# Patient Record
Sex: Female | Born: 1950 | Race: White | Hispanic: No | Smoking: Never smoker
Health system: Southern US, Community
[De-identification: ages and names within clinical notes are randomized; demographics above are authoritative.]

## PROBLEM LIST (undated history)

## (undated) DIAGNOSIS — K5792 Diverticulitis of intestine, part unspecified, without perforation or abscess without bleeding: Secondary | ICD-10-CM

## (undated) DIAGNOSIS — E079 Disorder of thyroid, unspecified: Secondary | ICD-10-CM

## (undated) DIAGNOSIS — A048 Other specified bacterial intestinal infections: Secondary | ICD-10-CM

## (undated) DIAGNOSIS — M1990 Unspecified osteoarthritis, unspecified site: Secondary | ICD-10-CM

## (undated) DIAGNOSIS — M810 Age-related osteoporosis without current pathological fracture: Secondary | ICD-10-CM

## (undated) DIAGNOSIS — T7840XA Allergy, unspecified, initial encounter: Secondary | ICD-10-CM

## (undated) DIAGNOSIS — L719 Rosacea, unspecified: Secondary | ICD-10-CM

## (undated) DIAGNOSIS — J40 Bronchitis, not specified as acute or chronic: Secondary | ICD-10-CM

## (undated) DIAGNOSIS — D509 Iron deficiency anemia, unspecified: Secondary | ICD-10-CM

## (undated) DIAGNOSIS — M5090 Cervical disc disorder, unspecified, unspecified cervical region: Secondary | ICD-10-CM

## (undated) HISTORY — PX: TUBAL LIGATION: SHX77

## (undated) HISTORY — DX: Other specified bacterial intestinal infections: A04.8

## (undated) HISTORY — DX: Allergy, unspecified, initial encounter: T78.40XA

## (undated) HISTORY — DX: Cervical disc disorder, unspecified, unspecified cervical region: M50.90

## (undated) HISTORY — DX: Age-related osteoporosis without current pathological fracture: M81.0

## (undated) HISTORY — PX: COLONOSCOPY: SHX174

## (undated) HISTORY — DX: Unspecified osteoarthritis, unspecified site: M19.90

## (undated) HISTORY — DX: Disorder of thyroid, unspecified: E07.9

## (undated) HISTORY — DX: Rosacea, unspecified: L71.9

## (undated) HISTORY — PX: CHOLECYSTECTOMY: SHX55

## (undated) HISTORY — DX: Diverticulitis of intestine, part unspecified, without perforation or abscess without bleeding: K57.92

## (undated) HISTORY — PX: AUGMENTATION MAMMAPLASTY: SUR837

## (undated) HISTORY — DX: Bronchitis, not specified as acute or chronic: J40

## (undated) HISTORY — PX: COSMETIC SURGERY: SHX468

---
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
## 1978-02-25 HISTORY — PX: OTHER SURGICAL HISTORY: SHX169

## 1984-02-26 HISTORY — PX: GALLBLADDER SURGERY: SHX652

## 1997-07-14 ENCOUNTER — Other Ambulatory Visit: Admission: RE | Admit: 1997-07-14 | Discharge: 1997-07-14 | Payer: Self-pay | Admitting: Unknown Physician Specialty

## 1998-07-21 ENCOUNTER — Encounter: Payer: Self-pay | Admitting: Obstetrics and Gynecology

## 1998-07-21 ENCOUNTER — Ambulatory Visit (HOSPITAL_COMMUNITY): Admission: RE | Admit: 1998-07-21 | Discharge: 1998-07-21 | Payer: Self-pay | Admitting: Obstetrics and Gynecology

## 1998-08-02 ENCOUNTER — Ambulatory Visit (HOSPITAL_COMMUNITY): Admission: RE | Admit: 1998-08-02 | Discharge: 1998-08-02 | Payer: Self-pay | Admitting: Obstetrics and Gynecology

## 1998-08-02 ENCOUNTER — Encounter: Payer: Self-pay | Admitting: Obstetrics and Gynecology

## 1998-09-13 ENCOUNTER — Ambulatory Visit (HOSPITAL_COMMUNITY): Admission: RE | Admit: 1998-09-13 | Discharge: 1998-09-13 | Payer: Self-pay | Admitting: Obstetrics and Gynecology

## 1998-09-13 ENCOUNTER — Encounter: Payer: Self-pay | Admitting: Obstetrics and Gynecology

## 1999-08-20 ENCOUNTER — Other Ambulatory Visit: Admission: RE | Admit: 1999-08-20 | Discharge: 1999-08-20 | Payer: Self-pay | Admitting: Obstetrics and Gynecology

## 1999-09-04 ENCOUNTER — Encounter: Admission: RE | Admit: 1999-09-04 | Discharge: 1999-09-04 | Payer: Self-pay | Admitting: Obstetrics and Gynecology

## 1999-09-04 ENCOUNTER — Encounter: Payer: Self-pay | Admitting: Obstetrics and Gynecology

## 2000-03-19 ENCOUNTER — Encounter: Payer: Self-pay | Admitting: Obstetrics and Gynecology

## 2000-03-19 ENCOUNTER — Ambulatory Visit (HOSPITAL_COMMUNITY): Admission: RE | Admit: 2000-03-19 | Discharge: 2000-03-19 | Payer: Self-pay | Admitting: Obstetrics and Gynecology

## 2000-08-20 ENCOUNTER — Other Ambulatory Visit: Admission: RE | Admit: 2000-08-20 | Discharge: 2000-08-20 | Payer: Self-pay | Admitting: Obstetrics and Gynecology

## 2000-09-05 ENCOUNTER — Encounter: Admission: RE | Admit: 2000-09-05 | Discharge: 2000-09-05 | Payer: Self-pay | Admitting: Obstetrics and Gynecology

## 2000-09-05 ENCOUNTER — Encounter: Payer: Self-pay | Admitting: Obstetrics and Gynecology

## 2001-02-25 HISTORY — PX: BREAST ENHANCEMENT SURGERY: SHX7

## 2001-08-21 ENCOUNTER — Other Ambulatory Visit: Admission: RE | Admit: 2001-08-21 | Discharge: 2001-08-21 | Payer: Self-pay | Admitting: Obstetrics and Gynecology

## 2001-09-08 ENCOUNTER — Encounter: Admission: RE | Admit: 2001-09-08 | Discharge: 2001-09-08 | Payer: Self-pay | Admitting: Obstetrics and Gynecology

## 2001-09-08 ENCOUNTER — Encounter: Payer: Self-pay | Admitting: Obstetrics and Gynecology

## 2002-08-23 ENCOUNTER — Other Ambulatory Visit: Admission: RE | Admit: 2002-08-23 | Discharge: 2002-08-23 | Payer: Self-pay | Admitting: Obstetrics and Gynecology

## 2002-09-13 ENCOUNTER — Encounter: Payer: Self-pay | Admitting: Obstetrics and Gynecology

## 2002-09-13 ENCOUNTER — Encounter: Admission: RE | Admit: 2002-09-13 | Discharge: 2002-09-13 | Payer: Self-pay | Admitting: Obstetrics and Gynecology

## 2003-06-03 ENCOUNTER — Ambulatory Visit (HOSPITAL_COMMUNITY): Admission: RE | Admit: 2003-06-03 | Discharge: 2003-06-03 | Payer: Self-pay | Admitting: Orthopedic Surgery

## 2003-09-23 ENCOUNTER — Encounter: Admission: RE | Admit: 2003-09-23 | Discharge: 2003-09-23 | Payer: Self-pay | Admitting: Obstetrics and Gynecology

## 2003-10-05 ENCOUNTER — Other Ambulatory Visit: Admission: RE | Admit: 2003-10-05 | Discharge: 2003-10-05 | Payer: Self-pay | Admitting: Obstetrics and Gynecology

## 2003-10-13 ENCOUNTER — Encounter: Admission: RE | Admit: 2003-10-13 | Discharge: 2003-10-13 | Payer: Self-pay | Admitting: Obstetrics and Gynecology

## 2004-06-13 ENCOUNTER — Emergency Department (HOSPITAL_COMMUNITY): Admission: EM | Admit: 2004-06-13 | Discharge: 2004-06-13 | Payer: Self-pay | Admitting: Family Medicine

## 2004-07-04 ENCOUNTER — Encounter: Admission: RE | Admit: 2004-07-04 | Discharge: 2004-07-04 | Payer: Self-pay | Admitting: Obstetrics and Gynecology

## 2004-10-24 ENCOUNTER — Encounter: Admission: RE | Admit: 2004-10-24 | Discharge: 2004-10-24 | Payer: Self-pay | Admitting: Obstetrics and Gynecology

## 2004-11-22 ENCOUNTER — Other Ambulatory Visit: Admission: RE | Admit: 2004-11-22 | Discharge: 2004-11-22 | Payer: Self-pay | Admitting: Obstetrics and Gynecology

## 2005-12-02 ENCOUNTER — Other Ambulatory Visit: Admission: RE | Admit: 2005-12-02 | Discharge: 2005-12-02 | Payer: Self-pay | Admitting: Obstetrics and Gynecology

## 2005-12-31 ENCOUNTER — Encounter: Admission: RE | Admit: 2005-12-31 | Discharge: 2005-12-31 | Payer: Self-pay | Admitting: Obstetrics and Gynecology

## 2006-02-27 ENCOUNTER — Ambulatory Visit: Payer: Self-pay | Admitting: Gastroenterology

## 2006-03-07 ENCOUNTER — Ambulatory Visit: Payer: Self-pay | Admitting: Gastroenterology

## 2006-03-09 ENCOUNTER — Emergency Department (HOSPITAL_COMMUNITY): Admission: EM | Admit: 2006-03-09 | Discharge: 2006-03-09 | Payer: Self-pay | Admitting: Emergency Medicine

## 2006-03-10 ENCOUNTER — Ambulatory Visit: Payer: Self-pay | Admitting: Cardiology

## 2006-03-10 ENCOUNTER — Ambulatory Visit: Payer: Self-pay | Admitting: Gastroenterology

## 2006-03-10 LAB — CONVERTED CEMR LAB
Bilirubin Urine: NEGATIVE
Crystals: NEGATIVE
Ketones, ur: NEGATIVE mg/dL
Leukocytes, UA: NEGATIVE
Mucus, UA: NEGATIVE
Nitrite: NEGATIVE
Urine Glucose: NEGATIVE mg/dL
Urobilinogen, UA: 0.2 (ref 0.0–1.0)

## 2006-08-26 LAB — HM COLONOSCOPY: HM Colonoscopy: NORMAL

## 2007-01-07 ENCOUNTER — Encounter: Admission: RE | Admit: 2007-01-07 | Discharge: 2007-01-07 | Payer: Self-pay | Admitting: Obstetrics

## 2007-04-08 ENCOUNTER — Emergency Department (HOSPITAL_COMMUNITY): Admission: EM | Admit: 2007-04-08 | Discharge: 2007-04-08 | Payer: Self-pay | Admitting: Emergency Medicine

## 2008-01-01 ENCOUNTER — Encounter: Admission: RE | Admit: 2008-01-01 | Discharge: 2008-01-01 | Payer: Self-pay | Admitting: Obstetrics

## 2008-04-27 ENCOUNTER — Emergency Department (HOSPITAL_COMMUNITY): Admission: EM | Admit: 2008-04-27 | Discharge: 2008-04-27 | Payer: Self-pay | Admitting: Emergency Medicine

## 2008-05-31 ENCOUNTER — Encounter: Admission: RE | Admit: 2008-05-31 | Discharge: 2008-05-31 | Payer: Self-pay | Admitting: Neurosurgery

## 2009-01-04 ENCOUNTER — Encounter: Admission: RE | Admit: 2009-01-04 | Discharge: 2009-01-04 | Payer: Self-pay | Admitting: Obstetrics

## 2009-08-25 LAB — HM MAMMOGRAPHY: HM Mammogram: NORMAL

## 2009-09-29 ENCOUNTER — Telehealth (INDEPENDENT_AMBULATORY_CARE_PROVIDER_SITE_OTHER): Payer: Self-pay | Admitting: *Deleted

## 2010-01-05 ENCOUNTER — Encounter: Admission: RE | Admit: 2010-01-05 | Discharge: 2010-01-05 | Payer: Self-pay | Admitting: Obstetrics

## 2010-01-28 ENCOUNTER — Emergency Department (HOSPITAL_COMMUNITY)
Admission: EM | Admit: 2010-01-28 | Discharge: 2010-01-28 | Payer: Self-pay | Source: Home / Self Care | Admitting: Emergency Medicine

## 2010-03-27 NOTE — Progress Notes (Signed)
  Phone Note Other Incoming   Request: Send information Action Taken: Software engineer of Call: Request for records received from [employer]. Request forwarded to [employer].

## 2010-04-26 ENCOUNTER — Other Ambulatory Visit: Payer: Self-pay | Admitting: Neurosurgery

## 2010-04-26 DIAGNOSIS — M47812 Spondylosis without myelopathy or radiculopathy, cervical region: Secondary | ICD-10-CM

## 2010-04-30 ENCOUNTER — Other Ambulatory Visit: Payer: Self-pay

## 2010-05-08 LAB — URINALYSIS, ROUTINE W REFLEX MICROSCOPIC
Bilirubin Urine: NEGATIVE
Glucose, UA: NEGATIVE mg/dL
Ketones, ur: 15 mg/dL — AB
pH: 8.5 — ABNORMAL HIGH (ref 5.0–8.0)

## 2010-05-08 LAB — BASIC METABOLIC PANEL
BUN: 11 mg/dL (ref 6–23)
CO2: 24 mEq/L (ref 19–32)
Glucose, Bld: 91 mg/dL (ref 70–99)
Potassium: 3.6 mEq/L (ref 3.5–5.1)
Sodium: 138 mEq/L (ref 135–145)

## 2010-05-08 LAB — CBC
HCT: 34.1 % — ABNORMAL LOW (ref 36.0–46.0)
Hemoglobin: 10.7 g/dL — ABNORMAL LOW (ref 12.0–15.0)
MCH: 26.8 pg (ref 26.0–34.0)
MCHC: 31.4 g/dL (ref 30.0–36.0)

## 2010-05-08 LAB — URINE MICROSCOPIC-ADD ON

## 2010-05-08 LAB — DIFFERENTIAL
Lymphocytes Relative: 13 % (ref 12–46)
Lymphs Abs: 0.7 10*3/uL (ref 0.7–4.0)
Monocytes Absolute: 0.4 10*3/uL (ref 0.1–1.0)
Monocytes Relative: 7 % (ref 3–12)
Neutro Abs: 4.3 10*3/uL (ref 1.7–7.7)

## 2010-05-11 ENCOUNTER — Ambulatory Visit
Admission: RE | Admit: 2010-05-11 | Discharge: 2010-05-11 | Disposition: A | Discharge status: Home / Self Care | Payer: 59 | Source: Ambulatory Visit | Attending: Neurosurgery | Admitting: Neurosurgery

## 2010-05-11 DIAGNOSIS — M47812 Spondylosis without myelopathy or radiculopathy, cervical region: Secondary | ICD-10-CM

## 2010-07-13 NOTE — Assessment & Plan Note (Signed)
[facility]                                 ON-CALL NOTE   REGHAN, THUL                        MRN:          [id number]  DATE:03/08/2006                            DOB:          Jan 13, 1951    Ms. Brightbill called to say that approximately 4 hours following her  colonoscopy yesterday she developed low back pain.  Pain does radiate  around to the front and is sharp but not particularly intense.  She has  not had a bowel movement since her procedure.  Pain is only of mild  intensity.  It is persistent but not severe.  She has no nausea,  vomiting or fever.   I explained that this may be simple gas pain and perhaps some  musculoskeletal pain.  She is instructed me should the pain get worse or  if it does not slowly subside over the next 4-6 hours.     Barbette Hair. Arlyce Dice, MD,FACG  Electronically Signed    RDK/MedQ  DD: 03/08/2006  DT: 03/09/2006  Job #: [id number]   cc:   Ulyess Mort, MD

## 2010-07-13 NOTE — Assessment & Plan Note (Signed)
[facility]                                 ON-CALL NOTE   Bethany Carter, Bethany Carter                        MRN:          [id number]  DATE:03/09/2006                            DOB:          02-May-1950    Dr. Lynelle Doctor called me from the emergency room tonight.  Bethany Carter had a  colonoscopy on Friday.  It is not clear whether polyps were removed or  how the procedure went for her, but she, since then, has had some  cramping discomfort in her lower abdomen.  She spoke with Dr. Arlyce Dice  earlier either today or yesterday about her discomfort.  It seemed mild  and she was instructed to see how she did over the next several hours.  She continued to have the discomfort, so she eventually presented to the  emergency room, I believe at [facility].  Dr. Lynelle Doctor had already  evaluated her by the time he paged me.  He said she is not tender on  exam.  Her CBC and her electrolytes were completely normal.  An acute  abdominal series was done and it showed no free air.   It sounds like she may have some mild spasm discomfort following the air  insufflation from the colonoscopy.  I talked with Dr. Lynelle Doctor about giving  her a prescription for Levbid a 10 day supply to take 1 pill twice a day  for the next few days.  He will also be instructing her that, if she  feels worse or has fevers or chills, to call back.  The workup above was  very thorough and, as long as she does well over the next day or 2, I  see no reason for any further testing on her.     Rachael Fee, MD  Electronically Signed    DPJ/MedQ  DD: 03/09/2006  DT: 03/09/2006  Job #: [id number]

## 2010-07-13 NOTE — Assessment & Plan Note (Signed)
[facility]                         GASTROENTEROLOGY OFFICE NOTE   TELIA, AMUNDSON                        MRN:          [id number]  DATE:03/10/2006                            DOB:          November 02, 1950    PRIMARY CARE DOCTOR:  Dr. Royetta Crochet.   PRIMARY GASTROENTEROLOGIST:  Dr. Ulyess Mort.   GASTROINTESTINAL PROBLEM LIST:  Family history of a colon cancer in one  of her parents, status post colonoscopy in 2002 and most recently on  March 07, 2006 by Dr. Victorino Dike.  No polyps were found, but left-  sided diverticulosis was described.   INTERVAL HISTORY:  Bethany Carter was last seen here at [facility] 3 days ago.  She underwent colonoscopy by Dr. Victorino Dike for family history of colon  cancer.  Since then she has had low pain that wraps around to her  bilateral lower abdomen. She describes it as a cramping, a soreness.  She is better when lying down, better when sitting up very straight,  when she bends over the pain in her back and in her belly seem to get  worse.  She has had no nausea, vomiting.  She has not had a bowel  movement since the procedure.  She is eating very well.   Bethany Carter called, I believe, 3 times over the weekend.  She spoke with  Dr. Arlyce Dice twice, and was eventually sent to the emergency room for  evaluation on Sunday.  While there she had acute abdominal series and  blood tests.  I was called by the emergency room physician after all the  testing was done.  Lab tests were completely normal, acute abdominal  series was normal.  She was nontender on his examination.  She was given  Levbid to see if a spasm was a part of this.  She called this morning  because she was concerned because the pain has not gotten better and she  was fit in for a visit.  One of her concerns is she is leaving on a  cruise in 2 days and will be out of town on a boat for 2 weeks.   CURRENT MEDICATIONS:  Fosamax, Restoril, Wellbutrin.   PHYSICAL EXAMINATION:  Afebrile, normal vital signs.  ABDOMEN:  Soft, very mildly tender in her lower quadrants bilaterally.  Palpation of her low back without specific pains.   ASSESSMENT/PLAN:  A 60 year old woman with low back discomfort,  radiating around to her abdomen bilaterally since colonoscopy last  Friday.   Laboratories and imaging studies yesterday in the emergency room were  all normal.  I suspect she does indeed have some trapped gas, although  this seems to be taking a bit longer to resolve than is usual.  Possibly  she had some low back muscular strain during the colonoscopy.  I think  it is unlikely that she has had perforation given her laboratories, and  her abdominal exam, and her abdominal series yesterday.  But since she  will be leaving for a cruise in 2 days I think it is definitely safest  to proceed with  a computed tomography scan of her abdomen and pelvis  today.  Of note she did have a urinalysis done just prior to this visit  and that was normal.  If the  scan does not show anything then I will advise her to take Tylenol  periodically as well as nonsteroidal anti-inflammatory drugs for what  probably is low back strain.     Rachael Fee, MD  Electronically Signed    DPJ/MedQ  DD: 03/10/2006  DT: 03/10/2006  Job #: [id number]   cc:   Ulyess Mort, MD  Royetta Crochet, MD

## 2010-08-21 ENCOUNTER — Other Ambulatory Visit: Payer: Self-pay | Admitting: Neurosurgery

## 2010-08-21 DIAGNOSIS — M47812 Spondylosis without myelopathy or radiculopathy, cervical region: Secondary | ICD-10-CM

## 2010-08-22 ENCOUNTER — Ambulatory Visit
Admission: RE | Admit: 2010-08-22 | Discharge: 2010-08-22 | Disposition: A | Discharge status: Home / Self Care | Payer: 59 | Source: Ambulatory Visit | Attending: Neurosurgery | Admitting: Neurosurgery

## 2010-08-22 DIAGNOSIS — M47812 Spondylosis without myelopathy or radiculopathy, cervical region: Secondary | ICD-10-CM

## 2010-10-02 ENCOUNTER — Telehealth: Payer: Self-pay | Admitting: Internal Medicine

## 2010-10-02 NOTE — Telephone Encounter (Signed)
Forwarded to Dr. John for review °

## 2010-10-09 ENCOUNTER — Other Ambulatory Visit (INDEPENDENT_AMBULATORY_CARE_PROVIDER_SITE_OTHER): Payer: 59

## 2010-10-09 ENCOUNTER — Encounter: Payer: Self-pay | Admitting: Internal Medicine

## 2010-10-09 ENCOUNTER — Ambulatory Visit (INDEPENDENT_AMBULATORY_CARE_PROVIDER_SITE_OTHER): Payer: 59 | Admitting: Internal Medicine

## 2010-10-09 VITALS — BP 122/84 | HR 71 | Temp 97.1°F | Ht 63.0 in | Wt 119.1 lb

## 2010-10-09 DIAGNOSIS — Z23 Encounter for immunization: Secondary | ICD-10-CM

## 2010-10-09 DIAGNOSIS — R5381 Other malaise: Secondary | ICD-10-CM

## 2010-10-09 DIAGNOSIS — M810 Age-related osteoporosis without current pathological fracture: Secondary | ICD-10-CM | POA: Insufficient documentation

## 2010-10-09 DIAGNOSIS — Z0000 Encounter for general adult medical examination without abnormal findings: Secondary | ICD-10-CM | POA: Insufficient documentation

## 2010-10-09 DIAGNOSIS — M5090 Cervical disc disorder, unspecified, unspecified cervical region: Secondary | ICD-10-CM | POA: Insufficient documentation

## 2010-10-09 DIAGNOSIS — R5383 Other fatigue: Secondary | ICD-10-CM | POA: Insufficient documentation

## 2010-10-09 DIAGNOSIS — T7840XA Allergy, unspecified, initial encounter: Secondary | ICD-10-CM | POA: Insufficient documentation

## 2010-10-09 LAB — CBC WITH DIFFERENTIAL/PLATELET
Basophils Absolute: 0 10*3/uL (ref 0.0–0.1)
Eosinophils Absolute: 0.1 10*3/uL (ref 0.0–0.7)
Hemoglobin: 11.6 g/dL — ABNORMAL LOW (ref 12.0–15.0)
Lymphocytes Relative: 30.7 % (ref 12.0–46.0)
Lymphs Abs: 1.3 10*3/uL (ref 0.7–4.0)
MCHC: 32.8 g/dL (ref 30.0–36.0)
Monocytes Absolute: 0.4 10*3/uL (ref 0.1–1.0)
Neutro Abs: 2.5 10*3/uL (ref 1.4–7.7)
RDW: 15.3 % — ABNORMAL HIGH (ref 11.5–14.6)

## 2010-10-09 LAB — BASIC METABOLIC PANEL
CO2: 29 mEq/L (ref 19–32)
Calcium: 10.2 mg/dL (ref 8.4–10.5)
Chloride: 108 mEq/L (ref 96–112)
Sodium: 141 mEq/L (ref 135–145)

## 2010-10-09 LAB — VITAMIN B12: Vitamin B-12: 237 pg/mL (ref 211–911)

## 2010-10-09 LAB — LIPID PANEL
HDL: 80.5 mg/dL (ref >39.00)
Total CHOL/HDL Ratio: 2

## 2010-10-09 LAB — HEPATIC FUNCTION PANEL
Alkaline Phosphatase: 69 U/L (ref 39–117)
Bilirubin, Direct: 0.1 mg/dL (ref 0.0–0.3)
Total Protein: 6.7 g/dL (ref 6.0–8.3)

## 2010-10-09 LAB — HIGH SENSITIVITY CRP: CRP, High Sensitivity: 0.37 mg/L (ref 0.000–5.000)

## 2010-10-09 LAB — URINALYSIS, ROUTINE W REFLEX MICROSCOPIC
Bilirubin Urine: NEGATIVE
Hgb urine dipstick: NEGATIVE
Ketones, ur: NEGATIVE
Urine Glucose: NEGATIVE
Urobilinogen, UA: 0.2 (ref 0.0–1.0)

## 2010-10-09 MED ORDER — TEMAZEPAM 15 MG PO CAPS: ORAL_CAPSULE | ORAL | Status: DC
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
## 2010-10-09 NOTE — Assessment & Plan Note (Signed)

## 2010-10-09 NOTE — Patient Instructions (Addendum)
You should be contacted about Jan 2013 for followup colonoscopy Please remember to followup with your GYN for the yearly pap smear and/or mammogram You had the shingles shot today We can consider the tetanus shot later Please consider Prolia for the osteoporosis;  If you want to consider this, call here and we can determine the copay to see if affordable Please go to LAB in the Basement for the blood and/or urine tests to be done today Please call the phone number [phone] (the PhoneTree System) for results of testing in 2-3 days;  When calling, simply dial the number, and when prompted enter the MRN number above (the Medical Record Number) and the # key, then the message should start. Continue all other medications as before, except increase the temazepam to 1-2 at night as needed for sleep Please return in 1 year for your yearly visit, or sooner if needed, with Lab testing done 3-5 days before

## 2010-10-09 NOTE — Assessment & Plan Note (Signed)
Etiology unclear, Exam otherwise benign, to check labs as documented, follow with expectant management  

## 2010-10-09 NOTE — Assessment & Plan Note (Signed)
To consider prolia

## 2010-10-10 LAB — VITAMIN D 25 HYDROXY (VIT D DEFICIENCY, FRACTURES): Vit D, 25-Hydroxy: 26 ng/mL — ABNORMAL LOW (ref 30–89)

## 2010-10-10 LAB — IBC PANEL
Iron: 53 ug/dL (ref 42–145)
Transferrin: 376.2 mg/dL — ABNORMAL HIGH (ref 212.0–360.0)

## 2010-11-16 LAB — DIFFERENTIAL
Basophils Absolute: 0
Eosinophils Absolute: 0
Eosinophils Relative: 1
Neutrophils Relative %: 65

## 2010-11-16 LAB — POCT CARDIAC MARKERS
CKMB, poc: <1 — ABNORMAL LOW
Myoglobin, poc: 26.2
Myoglobin, poc: 47.8
Operator id: 282201
Operator id: 282201

## 2010-11-16 LAB — CBC
HCT: 38.1
MCV: 86.1
Platelets: 205
RDW: 13.5
WBC: 4

## 2010-11-16 LAB — I-STAT 8, (EC8 V) (CONVERTED LAB)
BUN: 11
Chloride: 105
Glucose, Bld: 126 — ABNORMAL HIGH
Potassium: 3.7
Sodium: 136
pH, Ven: 7.408 — ABNORMAL HIGH

## 2010-11-16 LAB — D-DIMER, QUANTITATIVE: D-Dimer, Quant: 0.25

## 2010-12-15 ENCOUNTER — Ambulatory Visit (INDEPENDENT_AMBULATORY_CARE_PROVIDER_SITE_OTHER): Payer: 59 | Admitting: Family Medicine

## 2010-12-15 VITALS — BP 120/86 | HR 71 | Temp 97.0°F | Wt 120.0 lb

## 2010-12-15 DIAGNOSIS — H6692 Otitis media, unspecified, left ear: Secondary | ICD-10-CM | POA: Insufficient documentation

## 2010-12-15 DIAGNOSIS — J42 Unspecified chronic bronchitis: Secondary | ICD-10-CM

## 2010-12-15 DIAGNOSIS — H6690 Otitis media, unspecified, unspecified ear: Secondary | ICD-10-CM

## 2010-12-15 MED ORDER — AZITHROMYCIN 250 MG PO TABS: ORAL_TABLET | ORAL | Status: AC

## 2010-12-15 MED ORDER — AMOXICILLIN 500 MG PO CAPS: 500.0000 mg | ORAL_CAPSULE | Freq: Three times a day (TID) | ORAL | Status: AC
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
## 2010-12-15 NOTE — Assessment & Plan Note (Signed)
For 1 week and not improving with general uri symptoms , hx of reactive airways but totally clear on exam Will tx with zithromax Pt has hydrocodone med for her recent carpal tunnel sx that is helping cough and pain  Rest/ fluids  Update if worse/high fever/ wheeze

## 2010-12-15 NOTE — Patient Instructions (Addendum)
Drink lots of fluids  Get rest  Use saline nasal spray and plain mucinex for congestion  Take the amoxicillin for ear infection (and also early sinus infection)  Follow up if not improving in the next week

## 2010-12-23 NOTE — Assessment & Plan Note (Signed)
With likely sinusitis tx with amoxicillin and update  Disc sympt care- see inst  F/u if not improving next week

## 2011-01-03 ENCOUNTER — Other Ambulatory Visit: Payer: Self-pay | Admitting: Obstetrics

## 2011-01-03 DIAGNOSIS — Z1231 Encounter for screening mammogram for malignant neoplasm of breast: Secondary | ICD-10-CM

## 2011-01-28 ENCOUNTER — Ambulatory Visit
Admission: RE | Admit: 2011-01-28 | Discharge: 2011-01-28 | Disposition: A | Discharge status: Home / Self Care | Payer: 59 | Source: Ambulatory Visit | Attending: Obstetrics | Admitting: Obstetrics

## 2011-01-28 DIAGNOSIS — Z1231 Encounter for screening mammogram for malignant neoplasm of breast: Secondary | ICD-10-CM

## 2011-02-07 ENCOUNTER — Encounter: Payer: Self-pay | Admitting: Gastroenterology

## 2011-03-06 ENCOUNTER — Ambulatory Visit (AMBULATORY_SURGERY_CENTER): Payer: 59 | Admitting: *Deleted

## 2011-03-06 VITALS — Ht 63.0 in | Wt 123.3 lb

## 2011-03-06 DIAGNOSIS — Z1211 Encounter for screening for malignant neoplasm of colon: Secondary | ICD-10-CM

## 2011-03-06 MED ORDER — MOVIPREP 100 G PO SOLR: ORAL | Status: DC

## 2011-03-12 ENCOUNTER — Ambulatory Visit (INDEPENDENT_AMBULATORY_CARE_PROVIDER_SITE_OTHER): Payer: 59 | Admitting: Internal Medicine

## 2011-03-12 ENCOUNTER — Encounter: Payer: Self-pay | Admitting: Internal Medicine

## 2011-03-12 VITALS — BP 120/80 | HR 76 | Temp 98.0°F | Resp 16 | Wt 123.0 lb

## 2011-03-12 DIAGNOSIS — J069 Acute upper respiratory infection, unspecified: Secondary | ICD-10-CM | POA: Insufficient documentation

## 2011-03-12 MED ORDER — AZITHROMYCIN 250 MG PO TABS: ORAL_TABLET | ORAL | Status: AC

## 2011-03-12 MED ORDER — BENZONATATE 100 MG PO CAPS: 200.0000 mg | ORAL_CAPSULE | Freq: Three times a day (TID) | ORAL | Status: AC | PRN

## 2011-03-12 MED ORDER — HYDROCOD POLST-CPM POLST ER 10-8 MG PO CP12: 1.0000 | ORAL_CAPSULE | Freq: Two times a day (BID) | ORAL | Status: DC | PRN

## 2011-03-12 NOTE — Patient Instructions (Addendum)
Use over-the-counter  "cold" medicines  such as "Tylenol cold" , "Advil cold",  "Mucinex" or" Mucinex D"  for cough and congestion.   Avoid decongestants if you have high blood pressure and use "Afrin" nasal spray for nasal congestion as directed instead. Use" Delsym" or" Robitussin" cough syrup varietis for cough.  You can use plain "Tylenol" or "Advil" for fever, chills and achyness.  Please, make an appointment if you are not better or if you're worse.  

## 2011-03-12 NOTE — Progress Notes (Signed)
  Subjective:    Patient ID: Bethany Carter, female    DOB: Sep 14, 1950, 61 y.o.   MRN: [id number]  HPI   HPI  C/o URI sx's x   14 days. C/o ST, cough, weakness. Not better with OTC medicines. Actually, the patient is getting worse. The patient did not sleep last night due to cough.  Review of Systems  Constitutional: Positive for fever, chills and fatigue.  HENT: Positive for congestion, rhinorrhea, sneezing and postnasal drip.   Eyes: Positive for photophobia and pain. Negative for discharge and visual disturbance.  Respiratory: Positive for cough and wheezing.   Positive for chest pain.  Gastrointestinal: Negative for vomiting, abdominal pain, diarrhea and abdominal distention.  Genitourinary: Negative for dysuria and difficulty urinating.  Skin: Negative for rash.  Neu     Review of Systems     Objective:   Physical Exam  Constitutional: She appears well-developed. No distress.  HENT:  Head: Normocephalic.  Right Ear: External ear normal.  Left Ear: External ear normal.  Nose: Nose normal.  Mouth/Throat: Oropharynx is clear and moist.       eryth throat  Eyes: Conjunctivae are normal. Pupils are equal, round, and reactive to light. Right eye exhibits no discharge. Left eye exhibits no discharge.  Neck: Normal range of motion. Neck supple. No JVD present. No tracheal deviation present. No thyromegaly present.  Cardiovascular: Normal rate, regular rhythm and normal heart sounds.   Pulmonary/Chest: No stridor. No respiratory distress. She has no wheezes.  Abdominal: Soft. Bowel sounds are normal. She exhibits no distension. There is no tenderness.  Lymphadenopathy:    She has no cervical adenopathy.  Skin: No rash noted.  Psychiatric: She has a normal mood and affect.          Assessment & Plan:

## 2011-03-12 NOTE — Assessment & Plan Note (Signed)
Start Zpac See Meds

## 2011-03-20 ENCOUNTER — Encounter: Payer: Self-pay | Admitting: Gastroenterology

## 2011-03-20 ENCOUNTER — Ambulatory Visit (AMBULATORY_SURGERY_CENTER): Payer: 59 | Admitting: Gastroenterology

## 2011-03-20 VITALS — BP 97/29 | HR 69 | Temp 96.4°F | Resp 17 | Ht 63.0 in | Wt 123.0 lb

## 2011-03-20 DIAGNOSIS — Z1211 Encounter for screening for malignant neoplasm of colon: Secondary | ICD-10-CM

## 2011-03-20 DIAGNOSIS — Z800 Family history of malignant neoplasm of digestive organs: Secondary | ICD-10-CM

## 2011-03-20 MED ORDER — SODIUM CHLORIDE 0.9 % IV SOLN: 500.0000 mL | INTRAVENOUS | Status: DC

## 2011-03-20 NOTE — Patient Instructions (Signed)
Green and blue discharge instructions reviewed with patient and care partner.  Impression/recommendations:  Normal colon  Repeat exam in 5 years.  Resume medications as you were taking them prior to your procedure.

## 2011-03-20 NOTE — Progress Notes (Signed)
Patient did not experience any of the following events: a burn prior to discharge; a fall within the facility; wrong site/side/patient/procedure/implant event; or a hospital transfer or hospital admission upon discharge from the facility. (G8907) Patient did not have preoperative order for IV antibiotic SSI prophylaxis. (G8918)  

## 2011-03-20 NOTE — Op Note (Signed)
[facility] [address] [place], [place]  [address]  COLONOSCOPY PROCEDURE REPORT  PATIENT:  Bethany Carter, Bethany Carter  MR#:  [id number] BIRTHDATE:  September 14, 1950, 60 yrs. old  GENDER:  female ENDOSCOPIST:  Rachael Fee, MD PROCEDURE DATE:  03/20/2011 PROCEDURE:  Colonoscopy 62[address] ASA CLASS:  Class II INDICATIONS:  Elevated Risk Screening, father had colon cancer, colonsocopy 2008 with SML found no polyps MEDICATIONS:   Fentanyl 75 mcg IV, These medications were titrated to patient response per physician's verbal order, Versed 7 mg IV  DESCRIPTION OF PROCEDURE:   After the risks benefits and alternatives of the procedure were thoroughly explained, informed consent was obtained.  Digital rectal exam was performed and revealed no rectal masses.   The LB [id number]-[id number] endoscope was introduced through the anus and advanced to the cecum, which was identified by both the appendix and ileocecal valve, without limitations.  The quality of the prep was good..  The instrument was then slowly withdrawn as the colon was fully examined. <<PROCEDUREIMAGES>> FINDINGS:  A normal appearing cecum, ileocecal valve, and appendiceal orifice were identified. The ascending, hepatic flexure, transverse, splenic flexure, descending, sigmoid colon, and rectum appeared unremarkable (see image1, image2, and image3). Retroflexed views in the rectum revealed no abnormalities. COMPLICATIONS:  None  ENDOSCOPIC IMPRESSION: 1) Normal colon 2) No polyps or cancers  RECOMMENDATIONS: 1) Given your significant family history of colon cancer, you should have a repeat colonoscopy in 5 years  REPEAT EXAM:  5 years  ______________________________ Rachael Fee, MD  n. eSIGNED:   Rachael Fee at 03/20/2011 10:05 AM  Johnn Hai, [phone]

## 2011-03-20 NOTE — Progress Notes (Signed)
The pt tolerated the colonoscopy well. Maw  Per the pt presedation "my blood pressure runs low".  Pt's first blood pressure was 97/29 and retook blood pressure 105/41.  I asked if her if these blood pressures were normal for her. "Yes" per the pt. maw

## 2011-03-21 ENCOUNTER — Telehealth: Payer: Self-pay

## 2011-03-21 NOTE — Telephone Encounter (Signed)
I called the pt at [phone] and got her voice mail.  I left a message for the pt to call us if she has any questions or concerns. maw

## 2011-03-22 NOTE — Progress Notes (Signed)
Addended by: Maple Hudson on: 03/22/2011 02:48 PM   Modules accepted: Level of Service

## 2011-05-27 ENCOUNTER — Other Ambulatory Visit: Payer: Self-pay

## 2011-05-27 MED ORDER — TEMAZEPAM 15 MG PO CAPS: ORAL_CAPSULE | ORAL | Status: DC

## 2011-05-27 NOTE — Telephone Encounter (Signed)
Faxed hardcopy to pharmacy. 

## 2011-05-27 NOTE — Telephone Encounter (Signed)
Done hardcopy to robin  

## 2011-11-28 ENCOUNTER — Telehealth: Payer: Self-pay

## 2011-11-28 DIAGNOSIS — Z0000 Encounter for general adult medical examination without abnormal findings: Secondary | ICD-10-CM

## 2011-11-28 NOTE — Telephone Encounter (Signed)
Pt called requesting labs prior to CPE 12/23. She would also like a return call to advise her on which las are ordered.

## 2011-11-28 NOTE — Telephone Encounter (Signed)
Ok for labs - I ordered

## 2011-11-29 MED ORDER — TEMAZEPAM 15 MG PO CAPS: ORAL_CAPSULE | ORAL | Status: DC

## 2011-11-29 NOTE — Telephone Encounter (Signed)
Faxed hardcopy to pharmacy. 

## 2011-11-29 NOTE — Telephone Encounter (Signed)
Patient informed of labs.   Patient is requesting temazepam refill to pharmacy please.

## 2011-11-29 NOTE — Telephone Encounter (Signed)
Done hardcopy to robin  

## 2012-01-08 ENCOUNTER — Other Ambulatory Visit: Payer: Self-pay | Admitting: Obstetrics

## 2012-01-08 DIAGNOSIS — Z9882 Breast implant status: Secondary | ICD-10-CM

## 2012-01-08 DIAGNOSIS — Z1231 Encounter for screening mammogram for malignant neoplasm of breast: Secondary | ICD-10-CM

## 2012-02-03 ENCOUNTER — Ambulatory Visit
Admission: RE | Admit: 2012-02-03 | Discharge: 2012-02-03 | Disposition: A | Discharge status: Home / Self Care | Payer: 59 | Source: Ambulatory Visit | Attending: Obstetrics | Admitting: Obstetrics

## 2012-02-03 DIAGNOSIS — Z9882 Breast implant status: Secondary | ICD-10-CM

## 2012-02-03 DIAGNOSIS — Z1231 Encounter for screening mammogram for malignant neoplasm of breast: Secondary | ICD-10-CM

## 2012-02-07 ENCOUNTER — Other Ambulatory Visit: Payer: Self-pay | Admitting: Obstetrics

## 2012-02-07 DIAGNOSIS — M810 Age-related osteoporosis without current pathological fracture: Secondary | ICD-10-CM

## 2012-02-13 ENCOUNTER — Other Ambulatory Visit: Payer: 59

## 2012-02-14 ENCOUNTER — Other Ambulatory Visit: Payer: 59

## 2012-02-14 ENCOUNTER — Other Ambulatory Visit (INDEPENDENT_AMBULATORY_CARE_PROVIDER_SITE_OTHER): Payer: 59

## 2012-02-14 DIAGNOSIS — Z0000 Encounter for general adult medical examination without abnormal findings: Secondary | ICD-10-CM

## 2012-02-14 LAB — BASIC METABOLIC PANEL
CO2: 32 mEq/L (ref 19–32)
Calcium: 10.5 mg/dL (ref 8.4–10.5)
Chloride: 105 mEq/L (ref 96–112)
Creatinine, Ser: 0.7 mg/dL (ref 0.4–1.2)
Glucose, Bld: 95 mg/dL (ref 70–99)
Sodium: 139 mEq/L (ref 135–145)

## 2012-02-14 LAB — CBC WITH DIFFERENTIAL/PLATELET
Basophils Absolute: 0 10*3/uL (ref 0.0–0.1)
Lymphocytes Relative: 34.4 % (ref 12.0–46.0)
Monocytes Relative: 9.5 % (ref 3.0–12.0)
Platelets: 248 10*3/uL (ref 150.0–400.0)
RDW: 14.4 % (ref 11.5–14.6)

## 2012-02-14 LAB — HEPATIC FUNCTION PANEL
AST: 20 U/L (ref 0–37)
Alkaline Phosphatase: 78 U/L (ref 39–117)
Total Bilirubin: 0.6 mg/dL (ref 0.3–1.2)

## 2012-02-14 LAB — LIPID PANEL
HDL: 76.4 mg/dL (ref >39.00)
LDL Cholesterol: 96 mg/dL (ref 0–99)
Total CHOL/HDL Ratio: 2

## 2012-02-14 LAB — URINALYSIS, ROUTINE W REFLEX MICROSCOPIC
Ketones, ur: NEGATIVE
Specific Gravity, Urine: 1.02 (ref 1.000–1.030)
Urine Glucose: NEGATIVE
pH: 6 (ref 5.0–8.0)

## 2012-02-14 LAB — TSH: TSH: 1.56 u[IU]/mL (ref 0.35–5.50)

## 2012-02-17 ENCOUNTER — Ambulatory Visit (INDEPENDENT_AMBULATORY_CARE_PROVIDER_SITE_OTHER): Payer: 59 | Admitting: Internal Medicine

## 2012-02-17 ENCOUNTER — Telehealth: Payer: Self-pay | Admitting: Cardiovascular Disease

## 2012-02-17 ENCOUNTER — Encounter: Payer: Self-pay | Admitting: Internal Medicine

## 2012-02-17 VITALS — BP 106/70 | HR 71 | Temp 96.9°F | Ht 63.0 in | Wt 123.5 lb

## 2012-02-17 DIAGNOSIS — Z0000 Encounter for general adult medical examination without abnormal findings: Secondary | ICD-10-CM

## 2012-02-17 DIAGNOSIS — Z23 Encounter for immunization: Secondary | ICD-10-CM

## 2012-02-17 DIAGNOSIS — M810 Age-related osteoporosis without current pathological fracture: Secondary | ICD-10-CM

## 2012-02-17 DIAGNOSIS — R300 Dysuria: Secondary | ICD-10-CM

## 2012-02-17 MED ORDER — ASPIRIN 81 MG PO TBEC: 81.0000 mg | DELAYED_RELEASE_TABLET | Freq: Every day | ORAL | Status: DC

## 2012-02-17 MED ORDER — TEMAZEPAM 15 MG PO CAPS: ORAL_CAPSULE | ORAL | Status: DC

## 2012-02-17 MED ORDER — CEPHALEXIN 500 MG PO CAPS: 500.0000 mg | ORAL_CAPSULE | Freq: Four times a day (QID) | ORAL | Status: DC

## 2012-02-17 MED ORDER — MELOXICAM 15 MG PO TABS: 15.0000 mg | ORAL_TABLET | Freq: Every day | ORAL | Status: DC | PRN

## 2012-02-17 MED ORDER — HYDROCHLOROTHIAZIDE 25 MG PO TABS: 25.0000 mg | ORAL_TABLET | Freq: Every day | ORAL | Status: DC | PRN

## 2012-02-17 NOTE — Patient Instructions (Addendum)
Take all new medications as prescribed  - the antibiotic All of your medications were refilled today You had the tetanus shot today Please start the Aspirin 81 mg - 1 per day - Enteric Coated only  - to reduce risk of stroke and heart attack Please remember to followup with your GYN for the yearly pap smear and/or mammogram Please continue your efforts at being more active, low cholesterol diet, and weight control. Thank you for enrolling in MyChart. Please follow the instructions below to securely access your online medical record. MyChart allows you to send messages to your doctor, view your test results, renew your prescriptions, schedule appointments, and more. To Log into MyChart, please go to [email or web address], and your Username is: [email or web address] Please consider the Prolia injections for osteoporosis Please return in 1 year for your yearly visit, or sooner if needed, with Lab testing done 3-5 days before

## 2012-02-17 NOTE — Assessment & Plan Note (Signed)

## 2012-02-17 NOTE — Telephone Encounter (Signed)
She was calling on her mother Bethany Carter,
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
## 2012-02-17 NOTE — Assessment & Plan Note (Signed)
ua reviewed, mild worsening symptoms, for antibx

## 2012-02-17 NOTE — Assessment & Plan Note (Signed)
Pt to consider prolia

## 2012-02-17 NOTE — Telephone Encounter (Signed)
Pt rtn call to jodette °

## 2012-02-20 ENCOUNTER — Ambulatory Visit
Admission: RE | Admit: 2012-02-20 | Discharge: 2012-02-20 | Disposition: A | Discharge status: Home / Self Care | Payer: 59 | Source: Ambulatory Visit | Attending: Obstetrics | Admitting: Obstetrics

## 2012-02-20 DIAGNOSIS — M810 Age-related osteoporosis without current pathological fracture: Secondary | ICD-10-CM

## 2012-03-10 LAB — HM MAMMOGRAPHY

## 2012-04-11 ENCOUNTER — Other Ambulatory Visit: Payer: Self-pay

## 2012-10-01 ENCOUNTER — Other Ambulatory Visit: Payer: Self-pay | Admitting: Internal Medicine

## 2012-10-01 NOTE — Telephone Encounter (Signed)
Done hardcopy to robin  

## 2012-10-02 NOTE — Telephone Encounter (Signed)
Faxed hardcopy to [facility] [place]  

## 2012-11-17 ENCOUNTER — Ambulatory Visit (INDEPENDENT_AMBULATORY_CARE_PROVIDER_SITE_OTHER): Payer: BC Managed Care – PPO

## 2012-11-17 DIAGNOSIS — Z23 Encounter for immunization: Secondary | ICD-10-CM

## 2012-12-02 ENCOUNTER — Ambulatory Visit (INDEPENDENT_AMBULATORY_CARE_PROVIDER_SITE_OTHER): Payer: BC Managed Care – PPO

## 2012-12-02 DIAGNOSIS — Z23 Encounter for immunization: Secondary | ICD-10-CM

## 2012-12-31 ENCOUNTER — Other Ambulatory Visit: Payer: Self-pay

## 2012-12-31 DIAGNOSIS — Z1231 Encounter for screening mammogram for malignant neoplasm of breast: Secondary | ICD-10-CM

## 2012-12-31 DIAGNOSIS — Z9882 Breast implant status: Secondary | ICD-10-CM

## 2013-01-02 ENCOUNTER — Ambulatory Visit (INDEPENDENT_AMBULATORY_CARE_PROVIDER_SITE_OTHER): Payer: BC Managed Care – PPO | Admitting: Family Medicine

## 2013-01-02 ENCOUNTER — Encounter: Payer: Self-pay | Admitting: Family Medicine

## 2013-01-02 VITALS — BP 110/72 | HR 74 | Temp 97.9°F | Wt 127.8 lb

## 2013-01-02 DIAGNOSIS — H6981 Other specified disorders of Eustachian tube, right ear: Secondary | ICD-10-CM

## 2013-01-02 DIAGNOSIS — H6980 Other specified disorders of Eustachian tube, unspecified ear: Secondary | ICD-10-CM

## 2013-01-02 NOTE — Patient Instructions (Signed)
Try saline nasal spray (generic, OTC) 2-3 times per day to irrigate/moisturize nasal passages.  Try OTC nasacort (nasal steroid) once daily and if improved with 2 weeks of use then call your MD for a prescription of a nasal steroid (generic).
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
## 2013-02-03 ENCOUNTER — Ambulatory Visit
Admission: RE | Admit: 2013-02-03 | Discharge: 2013-02-03 | Disposition: A | Discharge status: Home / Self Care | Payer: BC Managed Care – PPO | Source: Ambulatory Visit

## 2013-02-03 DIAGNOSIS — Z1231 Encounter for screening mammogram for malignant neoplasm of breast: Secondary | ICD-10-CM

## 2013-02-03 DIAGNOSIS — Z9882 Breast implant status: Secondary | ICD-10-CM

## 2013-03-02 ENCOUNTER — Other Ambulatory Visit (INDEPENDENT_AMBULATORY_CARE_PROVIDER_SITE_OTHER): Payer: BC Managed Care – PPO

## 2013-03-02 DIAGNOSIS — Z0000 Encounter for general adult medical examination without abnormal findings: Secondary | ICD-10-CM

## 2013-03-02 LAB — BASIC METABOLIC PANEL
BUN: 21 mg/dL (ref 6–23)
CALCIUM: 10 mg/dL (ref 8.4–10.5)
CO2: 27 mEq/L (ref 19–32)
Chloride: 108 mEq/L (ref 96–112)
Creatinine, Ser: 0.7 mg/dL (ref 0.4–1.2)
GFR: 94.62 mL/min (ref >60.00)
Glucose, Bld: 91 mg/dL (ref 70–99)
POTASSIUM: 4.5 meq/L (ref 3.5–5.1)
Sodium: 139 mEq/L (ref 135–145)

## 2013-03-02 LAB — CBC WITH DIFFERENTIAL/PLATELET
BASOS ABS: 0 10*3/uL (ref 0.0–0.1)
BASOS PCT: 1 % (ref 0.0–3.0)
Eosinophils Absolute: 0.1 10*3/uL (ref 0.0–0.7)
Eosinophils Relative: 2 % (ref 0.0–5.0)
HCT: 30.1 % — ABNORMAL LOW (ref 36.0–46.0)
Hemoglobin: 9.2 g/dL — ABNORMAL LOW (ref 12.0–15.0)
Lymphocytes Relative: 30 % (ref 12.0–46.0)
Lymphs Abs: 1.4 10*3/uL (ref 0.7–4.0)
MCHC: 30.7 g/dL (ref 30.0–36.0)
MCV: 67 fl — AB (ref 78.0–100.0)
MONO ABS: 0.3 10*3/uL (ref 0.1–1.0)
Monocytes Relative: 6 % (ref 3.0–12.0)
NEUTROS PCT: 61 % (ref 43.0–77.0)
Neutro Abs: 2.7 10*3/uL (ref 1.4–7.7)
Platelets: 250 10*3/uL (ref 150.0–400.0)
RBC: 4.49 Mil/uL (ref 3.87–5.11)
RDW: 18.3 % — AB (ref 11.5–14.6)
WBC: 4.5 10*3/uL (ref 4.5–10.5)

## 2013-03-02 LAB — HEPATIC FUNCTION PANEL
ALBUMIN: 4 g/dL (ref 3.5–5.2)
ALT: 45 U/L — AB (ref 0–35)
AST: 28 U/L (ref 0–37)
Alkaline Phosphatase: 87 U/L (ref 39–117)
Bilirubin, Direct: 0.1 mg/dL (ref 0.0–0.3)
Total Bilirubin: 0.5 mg/dL (ref 0.3–1.2)
Total Protein: 6.6 g/dL (ref 6.0–8.3)

## 2013-03-02 LAB — URINALYSIS, ROUTINE W REFLEX MICROSCOPIC
BILIRUBIN URINE: NEGATIVE
Hgb urine dipstick: NEGATIVE
Ketones, ur: NEGATIVE
Leukocytes, UA: NEGATIVE
NITRITE: NEGATIVE
PH: 7 (ref 5.0–8.0)
RBC / HPF: NONE SEEN (ref 0–?)
SPECIFIC GRAVITY, URINE: 1.02 (ref 1.000–1.030)
TOTAL PROTEIN, URINE-UPE24: NEGATIVE
Urine Glucose: NEGATIVE
Urobilinogen, UA: 0.2 (ref 0.0–1.0)
WBC UA: NONE SEEN (ref 0–?)

## 2013-03-02 LAB — LIPID PANEL
Cholesterol: 165 mg/dL (ref 0–200)
HDL: 68.1 mg/dL (ref >39.00)
LDL Cholesterol: 87 mg/dL (ref 0–99)
Total CHOL/HDL Ratio: 2
Triglycerides: 48 mg/dL (ref 0.0–149.0)
VLDL: 9.6 mg/dL (ref 0.0–40.0)

## 2013-03-02 LAB — TSH: TSH: 1.53 u[IU]/mL (ref 0.35–5.50)

## 2013-03-04 ENCOUNTER — Encounter: Payer: Self-pay | Admitting: Internal Medicine

## 2013-03-04 ENCOUNTER — Other Ambulatory Visit (INDEPENDENT_AMBULATORY_CARE_PROVIDER_SITE_OTHER): Payer: BC Managed Care – PPO

## 2013-03-04 ENCOUNTER — Ambulatory Visit (INDEPENDENT_AMBULATORY_CARE_PROVIDER_SITE_OTHER)
Admission: RE | Admit: 2013-03-04 | Discharge: 2013-03-04 | Disposition: A | Discharge status: Home / Self Care | Payer: BC Managed Care – PPO | Source: Ambulatory Visit | Attending: Internal Medicine | Admitting: Internal Medicine

## 2013-03-04 ENCOUNTER — Ambulatory Visit (INDEPENDENT_AMBULATORY_CARE_PROVIDER_SITE_OTHER): Payer: BC Managed Care – PPO | Admitting: Internal Medicine

## 2013-03-04 VITALS — BP 108/70 | HR 75 | Temp 97.0°F | Ht 63.0 in | Wt 128.8 lb

## 2013-03-04 DIAGNOSIS — Z0000 Encounter for general adult medical examination without abnormal findings: Secondary | ICD-10-CM

## 2013-03-04 DIAGNOSIS — M542 Cervicalgia: Secondary | ICD-10-CM

## 2013-03-04 DIAGNOSIS — M810 Age-related osteoporosis without current pathological fracture: Secondary | ICD-10-CM

## 2013-03-04 DIAGNOSIS — D509 Iron deficiency anemia, unspecified: Secondary | ICD-10-CM

## 2013-03-04 LAB — CBC WITH DIFFERENTIAL/PLATELET
BASOS ABS: 0 10*3/uL (ref 0.0–0.1)
Basophils Relative: 0.2 % (ref 0.0–3.0)
Eosinophils Absolute: 0.1 10*3/uL (ref 0.0–0.7)
Eosinophils Relative: 2.1 % (ref 0.0–5.0)
HCT: 29.9 % — ABNORMAL LOW (ref 36.0–46.0)
Hemoglobin: 9.3 g/dL — ABNORMAL LOW (ref 12.0–15.0)
Lymphocytes Relative: 34 % (ref 12.0–46.0)
Lymphs Abs: 1.8 10*3/uL (ref 0.7–4.0)
MCHC: 30.9 g/dL (ref 30.0–36.0)
MONO ABS: 0.4 10*3/uL (ref 0.1–1.0)
Monocytes Relative: 7.1 % (ref 3.0–12.0)
NEUTROS PCT: 56.6 % (ref 43.0–77.0)
Neutro Abs: 3 10*3/uL (ref 1.4–7.7)
PLATELETS: 261 10*3/uL (ref 150.0–400.0)
RBC: 4.49 Mil/uL (ref 3.87–5.11)
RDW: 18.3 % — ABNORMAL HIGH (ref 11.5–14.6)
WBC: 5.3 10*3/uL (ref 4.5–10.5)

## 2013-03-04 LAB — IBC PANEL
IRON: 13 ug/dL — AB (ref 42–145)
Saturation Ratios: 2.2 % — ABNORMAL LOW (ref 20.0–50.0)
TRANSFERRIN: 431 mg/dL — AB (ref 212.0–360.0)

## 2013-03-04 LAB — FERRITIN: FERRITIN: 2.4 ng/mL — AB (ref 10.0–291.0)

## 2013-03-04 LAB — VITAMIN B12: Vitamin B-12: 324 pg/mL (ref 211–911)

## 2013-03-04 MED ORDER — TRAMADOL HCL 50 MG PO TABS: 50.0000 mg | ORAL_TABLET | Freq: Four times a day (QID) | ORAL | Status: DC | PRN

## 2013-03-04 MED ORDER — TEMAZEPAM 15 MG PO CAPS: ORAL_CAPSULE | ORAL | Status: DC

## 2013-03-04 MED ORDER — PANTOPRAZOLE SODIUM 40 MG PO TBEC: 40.0000 mg | DELAYED_RELEASE_TABLET | Freq: Every day | ORAL | Status: DC

## 2013-03-04 MED ORDER — HYDROCHLOROTHIAZIDE 25 MG PO TABS: 25.0000 mg | ORAL_TABLET | Freq: Every day | ORAL | Status: DC | PRN

## 2013-03-04 NOTE — Patient Instructions (Addendum)
Your EKG was OK today Your blood work was OK except for the anemia as we discussed Please take all new medication as prescribed  - the generic protonix Please stop all prescription or OTC nsaids such as advil and alleve Please start OTC iron sulfate 325 mg twice per day (with colace once or twice per day as well to avoid constipation) Please go to the LAB in the Basement (turn left off the elevator) for the tests to be done today - just the repeat blood count, B12 level, and Iron levels You will be contacted regarding the referral for: GI If this is not diagnostic, you may need to see Hematology/oncology as well Please go to the XRAY Department in the Basement (go straight as you get off the elevator) for the x-ray testing - the neck xrays Please take all new medication as prescribed  - the Tramadol for pain as needed We can consider a trial of gabapentin or referral to Sports Medicine (or orthopedic) for the neck pain if persists) You will be contacted regarding the referral for: Prolia copay Please continue all other medications as before, and refills have been done if requested. Please have the pharmacy call with any other refills you may need. Please continue your efforts at being more active, low cholesterol diet, and weight control. You are otherwise up to date with prevention measures today.  Please keep your appointments with your specialists as you may have planned  Please remember to sign up for My Chart if you have not done so, as this will be important to you in the future with finding out test results, communicating by private email, and scheduling acute appointments online when needed.  Please return in 6 months, or sooner if needed

## 2013-03-04 NOTE — Progress Notes (Signed)
.  lbpcmh

## 2013-03-04 NOTE — Assessment & Plan Note (Signed)
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
## 2013-03-04 NOTE — Progress Notes (Signed)
Pre visit review using our clinic review tool, if applicable. No additional management support is needed unless otherwise documented below in the visit note. 

## 2013-03-07 DIAGNOSIS — M542 Cervicalgia: Secondary | ICD-10-CM | POA: Insufficient documentation

## 2013-03-07 DIAGNOSIS — D509 Iron deficiency anemia, unspecified: Secondary | ICD-10-CM | POA: Insufficient documentation

## 2013-03-07 NOTE — Assessment & Plan Note (Signed)
To consider prolia,  to f/u any worsening symptoms or concerns

## 2013-03-07 NOTE — Assessment & Plan Note (Signed)
Unclear etiology- ? msk vs neuritic, for plain films today, tramadol prn, d/c nsaids due to anemia, consider MRI and/or trial gabapentin, or ortho eval

## 2013-03-07 NOTE — Assessment & Plan Note (Signed)
Suspect recent subacute onset, likely nsaid related GI loss though hx and exam o/w benign for this, for f/u cbc, iron/ferritin, GI referral, empiric PPI, d/c nsaids, consider hematology if not revealing

## 2013-03-10 ENCOUNTER — Encounter: Payer: Self-pay | Admitting: Gastroenterology

## 2013-03-12 ENCOUNTER — Encounter: Payer: Self-pay | Admitting: Gastroenterology

## 2013-03-12 ENCOUNTER — Ambulatory Visit (INDEPENDENT_AMBULATORY_CARE_PROVIDER_SITE_OTHER): Payer: BC Managed Care – PPO | Admitting: Gastroenterology

## 2013-03-12 VITALS — BP 116/74 | HR 68 | Ht 62.0 in | Wt 128.0 lb

## 2013-03-12 DIAGNOSIS — D509 Iron deficiency anemia, unspecified: Secondary | ICD-10-CM

## 2013-03-12 DIAGNOSIS — R1013 Epigastric pain: Secondary | ICD-10-CM

## 2013-03-12 NOTE — Progress Notes (Signed)
03/12/2013 Bethany Carter [id number] 1950/03/29   History of Present Illness:  This is a pleasant 63 year old female who is known to our practice for previous colonoscopy with Dr. Ardis Hughs.  That was performed in 02/2011 at which time it was normal, but repeat was recommended in 5 years from that time due to family history of colon cancer.  She was sent to our office today at the request of her PCP due to a new finding of IDA.  Recent Hgb was 9.3 grams.  On year ago it was 12.2 grams.  MCV is low at 66.7.  Iron is low at 13, iron % sat is 2.2, and ferritin is 2.4.  Vitamin B12 is normal.  She denies black tarry or bloody stools.  She does note that she's had some mild upper abdominal discomfort intermittently for the past month or so, however.  She cannot relate that pain to eating.  Denies nausea, vomiting, poor appetite, and weight loss.  She was using Aleve on a fairly regular basis previously, but has stopped using that since a couple of weeks ago.  She has also been placed on ferrous sulfate 325 mg BID by her PCP, which she just started taking about 5 days ago.  She was given a prescription for pantoprazole, but she has not started taking that yet and would like to wait and see what happens with her GI evaluation first.  She denies any complaints of heartburn.reflux.     Current Medications, Allergies, Past Medical History, Past Surgical History, Family History and Social History were reviewed in [employer] record.   Physical Exam: BP 132/70  Pulse 68  Ht 5\' 2"  (1.575 m)  Wt 128 lb (58.06 kg)  BMI 23.41 kg/m2 General: Well developed, white female in no acute distress Head: Normocephalic and atraumatic Eyes:  Sclerae anicteric, conjunctiva pink  Ears: Normal auditory acuity Lungs: Clear throughout to auscultation Heart: Regular rate and rhythm Abdomen: Soft, non-distended.  Normal bowel sounds.  Minimal epigastric TTP without R/R/G. Musculoskeletal: Symmetrical with no  gross deformities  Extremities: No edema  Neurological: Alert oriented x 4, grossly non-focal Psychological:  Alert and cooperative. Normal mood and affect  Assessment and Recommendations: -Iron deficiency anemia:  Start on iron supplements by her PCP, ferrous sulfate 325 mg BID.   -Epigastric abdominal discomfort  *She had a colonoscopy 2 years ago that was normal so we will not repeat that at this time, but she will undergo EGD to rule out GI cause of iron deficiency.  Please biopsy small bowel to rule out celiac disease.  Will check stool for occult blood x 3; if positive and EGD is negative then may need SBCE.

## 2013-03-12 NOTE — Progress Notes (Signed)
i agree with the plan above 

## 2013-03-12 NOTE — Patient Instructions (Signed)
Please follow instructions on Hemoccult cards and mail them back to [place] when finished.   You have been scheduled for an endoscopy with propofol. Please follow written instructions given to you at your visit today. If you use inhalers (even only as needed), please bring them with you on the day of your procedure.

## 2013-03-16 ENCOUNTER — Encounter: Payer: Self-pay | Admitting: Gastroenterology

## 2013-03-19 ENCOUNTER — Encounter: Payer: Self-pay | Admitting: Gastroenterology

## 2013-03-19 ENCOUNTER — Ambulatory Visit (AMBULATORY_SURGERY_CENTER): Payer: BC Managed Care – PPO | Admitting: Gastroenterology

## 2013-03-19 VITALS — BP 118/87 | HR 69 | Temp 97.6°F | Resp 17 | Ht 62.0 in | Wt 128.0 lb

## 2013-03-19 DIAGNOSIS — K2990 Gastroduodenitis, unspecified, without bleeding: Secondary | ICD-10-CM

## 2013-03-19 DIAGNOSIS — K2970 Gastritis, unspecified, without bleeding: Secondary | ICD-10-CM

## 2013-03-19 DIAGNOSIS — A048 Other specified bacterial intestinal infections: Secondary | ICD-10-CM

## 2013-03-19 DIAGNOSIS — D509 Iron deficiency anemia, unspecified: Secondary | ICD-10-CM

## 2013-03-19 DIAGNOSIS — D1330 Benign neoplasm of unspecified part of small intestine: Secondary | ICD-10-CM

## 2013-03-19 MED ORDER — SODIUM CHLORIDE 0.9 % IV SOLN: 500.0000 mL | INTRAVENOUS | Status: DC

## 2013-03-19 NOTE — Op Note (Signed)
[facility]  [address] [place], [address]   ENDOSCOPY PROCEDURE REPORT  PATIENT: Bethany, Carter  MR#: [id number] BIRTHDATE: [birth year] , 65  yrs. old GENDER: Female ENDOSCOPIST: Milus Banister, MD PROCEDURE DATE:  03/19/2013 PROCEDURE:  EGD w/ biopsy ASA CLASS:     Class II INDICATIONS:  new iron deficiency anemia; colonoscopy 2 years ago; recent alleve (2-4 pills daily). MEDICATIONS: Fentanyl 50 mcg IV, Versed 5 mg IV, and These medications were titrated to patient response per physician's verbal order TOPICAL ANESTHETIC: Cetacaine Spray  DESCRIPTION OF PROCEDURE: After the risks benefits and alternatives of the procedure were thoroughly explained, informed consent was obtained.  The [id number] endoscope was introduced through the mouth and advanced to the second portion of the duodenum. Without limitations.  The instrument was slowly withdrawn as the mucosa was fully examined.   There was a small (2cm) hiatal hernia without associated Cameron's erosions.  There was mild, non-specific distal gastritis.  Biopsies were taken and sent to pathology.  The duodenum was normal however biopsied and sent to pathology to check for Celiac Sprue.  The examination was otherwise normal.  Retroflexed views revealed no abnormalities.     The scope was then withdrawn from the patient and the procedure completed. COMPLICATIONS: There were no complications.  ENDOSCOPIC IMPRESSION: There was a small (2cm) hiatal hernia without associated Cameron's erosions.  There was mild, non-specific distal gastritis.  Biopsies were taken and sent to pathology.  The duodenum was normal however biopsied and sent to pathology to check for Celiac Sprue.  The examination was otherwise normal.  RECOMMENDATIONS: Await final pathology results.  IF no clear explanation for your IDA, then next step is sending in 2 sets of FOBT cards to see if there is any microscopic blood in your  stool.  In meantime, no need to start antiacid medicines, continue to avoid excessive NSAIDs.    eSigned:  Milus Banister, MD 03/19/2013 10:58 AM   CC: Cathlean Cower, MD

## 2013-03-19 NOTE — Patient Instructions (Signed)
AVOID EXCESSIVE USE OF NSAIDS( MOTRIN,ADVIL,ALEVE, NAPRASYN ETC)    YOU HAD AN ENDOSCOPIC PROCEDURE TODAY AT [facility]: Refer to the procedure report that was given to you for any specific questions about what was found during the examination.  If the procedure report does not answer your questions, please call your gastroenterologist to clarify.  If you requested that your care partner not be given the details of your procedure findings, then the procedure report has been included in a sealed envelope for you to review at your convenience later.  YOU SHOULD EXPECT: Some feelings of bloating in the abdomen. Passage of more gas than usual.  Walking can help get rid of the air that was put into your GI tract during the procedure and reduce the bloating. If you had a lower endoscopy (such as a colonoscopy or flexible sigmoidoscopy) you may notice spotting of blood in your stool or on the toilet paper. If you underwent a bowel prep for your procedure, then you may not have a normal bowel movement for a few days.  DIET: Your first meal following the procedure should be a light meal and then it is ok to progress to your normal diet.  A half-sandwich or bowl of soup is an example of a good first meal.  Heavy or fried foods are harder to digest and may make you feel nauseous or bloated.  Likewise meals heavy in dairy and vegetables can cause extra gas to form and this can also increase the bloating.  Drink plenty of fluids but you should avoid alcoholic beverages for 24 hours.  ACTIVITY: Your care partner should take you home directly after the procedure.  You should plan to take it easy, moving slowly for the rest of the day.  You can resume normal activity the day after the procedure however you should NOT DRIVE or use heavy machinery for 24 hours (because of the sedation medicines used during the test).    SYMPTOMS TO REPORT IMMEDIATELY: A gastroenterologist can be reached at any hour.   During normal business hours, 8:30 AM to 5:00 PM Monday through Friday, call [phone].  After hours and on weekends, please call the GI answering service at [phone] who will take a message and have the physician on call contact you.    Following upper endoscopy (EGD)  Vomiting of blood or coffee ground material  New chest pain or pain under the shoulder blades  Painful or persistently difficult swallowing  New shortness of breath  Fever of 100F or higher  Black, tarry-looking stools  FOLLOW UP: If any biopsies were taken you will be contacted by phone or by letter within the next 1-3 weeks.  Call your gastroenterologist if you have not heard about the biopsies in 3 weeks.  Our staff will call the home number listed on your records the next business day following your procedure to check on you and address any questions or concerns that you may have at that time regarding the information given to you following your procedure. This is a courtesy call and so if there is no answer at the home number and we have not heard from you through the emergency physician on call, we will assume that you have returned to your regular daily activities without incident.  SIGNATURES/CONFIDENTIALITY: You and/or your care partner have signed paperwork which will be entered into your electronic medical record.  These signatures attest to the fact that that the information above on your  After Visit Summary has been reviewed and is understood.  Full responsibility of the confidentiality of this discharge information lies with you and/or your care-partner.

## 2013-03-22 ENCOUNTER — Other Ambulatory Visit: Payer: Self-pay | Admitting: *Deleted

## 2013-03-22 ENCOUNTER — Telehealth: Payer: Self-pay | Admitting: *Deleted

## 2013-03-22 ENCOUNTER — Telehealth: Payer: Self-pay | Admitting: Gastroenterology

## 2013-03-22 DIAGNOSIS — K921 Melena: Secondary | ICD-10-CM

## 2013-03-22 NOTE — Telephone Encounter (Signed)
Called pt back in find out more information, pt states she had some dark stool on Saturday 03/20/13 and then on 03/21/13 had black tarry sticky stool, pt had EGD on Friday 03/19/13 with biopsies taken of gastritis and biopsies to rule out celiac sprue, pt has not had a bm today, pt denies any pain. pls advise-adm

## 2013-03-22 NOTE — Telephone Encounter (Signed)
Marisue Humble CMA will put CBC in system.  Called Bethany Carter back to advise Dr. Ardis Hughs would like for her to have a CBC drawn in lab today for tomorrow morning, Bethany Carter states she can not get here this afternoon so she will come in the morning, also advised Bethany Carter that if she has another black tarry bm to call us as soon as possible, she verified after hours number with me-adm

## 2013-03-22 NOTE — Telephone Encounter (Signed)
She needs CBC today or tomorrow. She should call if sooner if any further dark stools.

## 2013-03-22 NOTE — Telephone Encounter (Signed)
No answer, message left for the patient. 

## 2013-03-22 NOTE — Telephone Encounter (Signed)
Pt states she had not taken any iron since 03/16/13.

## 2013-03-23 ENCOUNTER — Other Ambulatory Visit (INDEPENDENT_AMBULATORY_CARE_PROVIDER_SITE_OTHER): Payer: BC Managed Care – PPO

## 2013-03-23 ENCOUNTER — Other Ambulatory Visit: Payer: Self-pay

## 2013-03-23 ENCOUNTER — Telehealth: Payer: Self-pay | Admitting: Gastroenterology

## 2013-03-23 DIAGNOSIS — K921 Melena: Secondary | ICD-10-CM

## 2013-03-23 DIAGNOSIS — D649 Anemia, unspecified: Secondary | ICD-10-CM

## 2013-03-23 LAB — CBC WITH DIFFERENTIAL/PLATELET
BASOS ABS: 0 10*3/uL (ref 0.0–0.1)
BASOS PCT: 0.2 % (ref 0.0–3.0)
Eosinophils Absolute: 0.1 10*3/uL (ref 0.0–0.7)
Eosinophils Relative: 1.3 % (ref 0.0–5.0)
HEMATOCRIT: 31.2 % — AB (ref 36.0–46.0)
HEMOGLOBIN: 9.6 g/dL — AB (ref 12.0–15.0)
LYMPHS ABS: 1.5 10*3/uL (ref 0.7–4.0)
Lymphocytes Relative: 32.7 % (ref 12.0–46.0)
MCHC: 30.9 g/dL (ref 30.0–36.0)
MCV: 67.8 fl — ABNORMAL LOW (ref 78.0–100.0)
Monocytes Absolute: 0.5 10*3/uL (ref 0.1–1.0)
Monocytes Relative: 11.5 % (ref 3.0–12.0)
Neutro Abs: 2.4 10*3/uL (ref 1.4–7.7)
Neutrophils Relative %: 54.3 % (ref 43.0–77.0)
Platelets: 310 10*3/uL (ref 150.0–400.0)
RBC: 4.6 Mil/uL (ref 3.87–5.11)
RDW: 18.7 % — AB (ref 11.5–14.6)
WBC: 4.4 10*3/uL — ABNORMAL LOW (ref 4.5–10.5)

## 2013-03-23 MED ORDER — CLARITHROMYCIN 500 MG PO TABS: 500.0000 mg | ORAL_TABLET | Freq: Two times a day (BID) | ORAL | Status: DC

## 2013-03-23 MED ORDER — AMOXICILLIN 500 MG PO TABS: 1000.0000 mg | ORAL_TABLET | Freq: Two times a day (BID) | ORAL | Status: DC

## 2013-03-23 NOTE — Telephone Encounter (Signed)
Pt aware to take protonix twice daily while on abx

## 2013-03-23 NOTE — Telephone Encounter (Signed)
FYI

## 2013-03-23 NOTE — Telephone Encounter (Signed)
Ok, i'll let her know plan after seeing the lab results.

## 2013-03-24 ENCOUNTER — Telehealth: Payer: Self-pay

## 2013-03-24 NOTE — Telephone Encounter (Signed)
Pt left a voice mail that she would like to know the name of the bacteria she has, (H pylori), Left message on machine to call back

## 2013-03-24 NOTE — Telephone Encounter (Signed)
Pt returned call and was given the requested information she will call with further concerns or questions

## 2013-04-05 ENCOUNTER — Telehealth: Payer: Self-pay | Admitting: Gastroenterology

## 2013-04-05 NOTE — Telephone Encounter (Signed)
appt scheduled with Amy for 04/06/13, pt aware

## 2013-04-05 NOTE — Telephone Encounter (Signed)
Definitely could be thrush.  Should have rov today with extender (tomorrow at latest).

## 2013-04-05 NOTE — Telephone Encounter (Signed)
Left message on machine to call back  

## 2013-04-05 NOTE — Telephone Encounter (Signed)
Pt has been taking antibiotic for h pylori and has developed cracked lips, burning sensation in her mouth,  Food makes it worse.  She states it is a "funny feeling inside the mouth"  Her mother has had thrush and states she believes that is what it is.  Please advise

## 2013-04-06 ENCOUNTER — Ambulatory Visit (INDEPENDENT_AMBULATORY_CARE_PROVIDER_SITE_OTHER): Payer: BC Managed Care – PPO | Admitting: Physician Assistant

## 2013-04-06 ENCOUNTER — Encounter: Payer: Self-pay | Admitting: Physician Assistant

## 2013-04-06 VITALS — BP 100/62 | HR 76 | Ht 62.5 in | Wt 132.0 lb

## 2013-04-06 DIAGNOSIS — K1370 Unspecified lesions of oral mucosa: Secondary | ICD-10-CM

## 2013-04-06 DIAGNOSIS — K1379 Other lesions of oral mucosa: Secondary | ICD-10-CM

## 2013-04-06 MED ORDER — MAGIC MOUTHWASH W/LIDOCAINE: ORAL | Status: DC

## 2013-04-06 MED ORDER — FLUCONAZOLE 150 MG PO TABS: ORAL_TABLET | ORAL | Status: DC

## 2013-04-06 NOTE — Progress Notes (Signed)
Subjective:    Patient ID: Bethany Carter, female    DOB: [birth year], 63 y.o.   MRN: [id number]  HPI Bethany Carter  is a pleasant 63 year old white female known to Dr. Oretha Caprice who has been undergoing evaluation for iron deficiency anemia. She had upper endoscopy done on January 15 showing mild gastritis. Duodenal biopsies were done to rule out celiac disease and these were negative gastric biopsies did show a severe gastritis and evidence of H. pylori. She is currently taking a course of amoxicillin and clarithromycin and says she has 2 days left. She was brought in to the office today after she called complaining of soreness and burning in her mouth onset about 3 days ago. She says is a little bit sore in the back of her throat to swallow and she has a metallic taste in her mouth. She does not have any dysphagia or odynophagia no complaints of abdominal pain.    Review of Systems  Constitutional: Negative.   HENT: Positive for mouth sores and sore throat.   Eyes: Negative.   Respiratory: Negative.   Cardiovascular: Negative.   Gastrointestinal: Negative.   Genitourinary: Negative.   Musculoskeletal: Negative.   Allergic/Immunologic: Negative.   Neurological: Negative.   Hematological: Negative.   Psychiatric/Behavioral: Negative.    Outpatient Prescriptions Prior to Visit  Medication Sig Dispense Refill  . amoxicillin (AMOXIL) 500 MG tablet Take 2 tablets (1,000 mg total) by mouth 2 (two) times daily.  56 tablet  0  . aspirin 81 MG EC tablet Take 1 tablet (81 mg total) by mouth daily. Swallow whole.  30 tablet  12  . clarithromycin (BIAXIN) 500 MG tablet Take 1 tablet (500 mg total) by mouth 2 (two) times daily.  28 tablet  0  . ferrous sulfate 325 (65 FE) MG tablet Take 325 mg by mouth 2 (two) times daily with a meal.      . hydrochlorothiazide (HYDRODIURIL) 25 MG tablet Take 1 tablet (25 mg total) by mouth daily as needed. For fluid  90 tablet  3  . pantoprazole (PROTONIX) 40 MG tablet        . Vitamin D, Ergocalciferol, (DRISDOL) 50000 UNITS CAPS capsule        No facility-administered medications prior to visit.   Allergies  Allergen Reactions  . Percocet [Oxycodone-Acetaminophen] Shortness Of Breath    Intense abd pain  . Codeine Nausea And Vomiting   Patient Active Problem List   Diagnosis Date Noted  . Anemia, iron deficiency 03/12/2013  . Epigastric discomfort 03/12/2013  . Neck pain on right side 03/07/2013  . Microcytic anemia 03/07/2013  . Osteoporosis 10/09/2010  . Preventative health care 10/09/2010  . Allergy   . Cervical disc disease    History  Substance Use Topics  . Smoking status: Never Smoker   . Smokeless tobacco: Never Used  . Alcohol Use: Yes     Comment: social,occasional   family history includes Arthritis in her father, other, and other; Breast cancer in her paternal aunt; Cancer in her father and other; Colon cancer in her paternal aunt; Diabetes in her father; Hypertension in her mother, other, and other; Stomach cancer in her paternal uncle; Sudden death in her other. There is no history of Esophageal cancer.     Objective:   Physical Exam  well-developed white female in no acute distress, pleasant blood pressure 100/62 pulse 75 height 5 foot 2 weight 132. HEENT; nontraumatic normocephalic EOMI PERRLA sclera anicteric oropharynx is clear no obvious thrush  or other oral lesions, Supple; no JVD, Cardiovascular; regular rate and rhythm with S1-S2 no murmur or gallop, Pulm; clear bilaterally, Abdomen ;soft nontender nondistended bowel sounds are active        Assessment & Plan:   #63  63 year old female female with altered taste and soreness in her mouth and pharynx after a 12 day course of amoxicillin and clarithromycin. There is no obvious pharyngitis or candidiasis She may have symptoms secondary to adverse side effects from medication. Rule out mild thrush   #2  iron deficiency anemia workup in progress etiology not entirely clear though  treating for H. pylori gastritis as potential etiology Last colonoscopy approximately 2 years ago  Plan; patient will stop the amoxicillin and clarithromycin she has had a 12 day course Start Magic mouthwash 5 cc swish and swallow 4 times daily x7 days Also will call in  Diflucan 150 mg x1 and then repeat in 3 days if needed. Patient is asked to try the Magic mouthwash first and stop the antibiotics but if her symptoms are persisting she may use the Diflucan  She will need followup office visit with Dr. Ardis Hughs in a few weeks regarding the iron deficiency anemia.

## 2013-04-06 NOTE — Patient Instructions (Signed)
We sent prescriptions to [facility] Magic mouthwash, take 5 cc swish and swallow. Diflucan for yeast infection. Stop current antibiotics. Call back if symptoms worsen .

## 2013-04-07 NOTE — Progress Notes (Signed)
i agree the the plan above

## 2013-05-03 ENCOUNTER — Other Ambulatory Visit (INDEPENDENT_AMBULATORY_CARE_PROVIDER_SITE_OTHER): Payer: BC Managed Care – PPO

## 2013-05-03 DIAGNOSIS — D649 Anemia, unspecified: Secondary | ICD-10-CM

## 2013-05-03 LAB — CBC WITH DIFFERENTIAL/PLATELET
Basophils Absolute: 0 10*3/uL (ref 0.0–0.1)
Basophils Relative: 0.1 % (ref 0.0–3.0)
EOS ABS: 0.1 10*3/uL (ref 0.0–0.7)
Eosinophils Relative: 1.9 % (ref 0.0–5.0)
HCT: 30.3 % — ABNORMAL LOW (ref 36.0–46.0)
Hemoglobin: 9.3 g/dL — ABNORMAL LOW (ref 12.0–15.0)
LYMPHS ABS: 1.3 10*3/uL (ref 0.7–4.0)
Lymphocytes Relative: 33.1 % (ref 12.0–46.0)
MCHC: 30.8 g/dL (ref 30.0–36.0)
Monocytes Absolute: 0.5 10*3/uL (ref 0.1–1.0)
Monocytes Relative: 14.1 % — ABNORMAL HIGH (ref 3.0–12.0)
NEUTROS PCT: 50.8 % (ref 43.0–77.0)
Neutro Abs: 1.9 10*3/uL (ref 1.4–7.7)
PLATELETS: 275 10*3/uL (ref 150.0–400.0)
RBC: 4.46 Mil/uL (ref 3.87–5.11)
RDW: 18.9 % — ABNORMAL HIGH (ref 11.5–14.6)
WBC: 3.8 10*3/uL — ABNORMAL LOW (ref 4.5–10.5)

## 2013-05-05 ENCOUNTER — Other Ambulatory Visit: Payer: Self-pay | Admitting: Gastroenterology

## 2013-05-05 ENCOUNTER — Ambulatory Visit (INDEPENDENT_AMBULATORY_CARE_PROVIDER_SITE_OTHER): Payer: BC Managed Care – PPO | Admitting: Gastroenterology

## 2013-05-05 ENCOUNTER — Ambulatory Visit (INDEPENDENT_AMBULATORY_CARE_PROVIDER_SITE_OTHER)
Admission: RE | Admit: 2013-05-05 | Discharge: 2013-05-05 | Disposition: A | Discharge status: Home / Self Care | Payer: BC Managed Care – PPO | Source: Ambulatory Visit | Attending: Gastroenterology | Admitting: Gastroenterology

## 2013-05-05 ENCOUNTER — Other Ambulatory Visit: Payer: Self-pay

## 2013-05-05 ENCOUNTER — Encounter: Payer: Self-pay | Admitting: Gastroenterology

## 2013-05-05 VITALS — BP 92/66 | HR 60 | Ht 62.0 in | Wt 133.0 lb

## 2013-05-05 DIAGNOSIS — D509 Iron deficiency anemia, unspecified: Secondary | ICD-10-CM

## 2013-05-05 DIAGNOSIS — M549 Dorsalgia, unspecified: Secondary | ICD-10-CM

## 2013-05-05 DIAGNOSIS — Z800 Family history of malignant neoplasm of digestive organs: Secondary | ICD-10-CM

## 2013-05-05 NOTE — Progress Notes (Signed)
Error
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
## 2013-05-05 NOTE — Patient Instructions (Addendum)
Colorguard stool testing. Please take iron supplement daily (1/2 pill per day at least). Labs in 6 weeks (iron, ferritin, cbc with differential). Xrays of lumbar sacral spine (plane films) for mid back pains.

## 2013-05-16 LAB — COLOGUARD: Cologuard: NEGATIVE

## 2013-06-08 ENCOUNTER — Telehealth: Payer: Self-pay | Admitting: Gastroenterology

## 2013-06-08 NOTE — Telephone Encounter (Signed)
Left message on machine to call back  

## 2013-06-08 NOTE — Telephone Encounter (Signed)
Pt has been notified and will have labs in 1 month and ROV 07/30/13

## 2013-06-08 NOTE — Telephone Encounter (Signed)
Please call her.  cologuard stool test was negative.     She needs cbc in 1 month, rov shortly afterwards.

## 2013-06-16 ENCOUNTER — Telehealth: Payer: Self-pay

## 2013-06-16 NOTE — Telephone Encounter (Signed)
Pt has been set up for labs in May per Dr Ardis Hughs

## 2013-06-16 NOTE — Telephone Encounter (Signed)
Message copied by Barron Alvine on Wed Jun 16, 2013  7:53 AM ------      Message from: Barron Alvine      Created: Wed May 05, 2013  9:43 AM       Pt to get labs  ------

## 2013-06-22 ENCOUNTER — Encounter: Payer: Self-pay | Admitting: Gastroenterology

## 2013-07-08 ENCOUNTER — Telehealth: Payer: Self-pay

## 2013-07-08 ENCOUNTER — Other Ambulatory Visit (INDEPENDENT_AMBULATORY_CARE_PROVIDER_SITE_OTHER): Payer: BC Managed Care – PPO

## 2013-07-08 DIAGNOSIS — D509 Iron deficiency anemia, unspecified: Secondary | ICD-10-CM

## 2013-07-08 LAB — CBC WITH DIFFERENTIAL/PLATELET
Basophils Absolute: 0 10*3/uL (ref 0.0–0.1)
Basophils Relative: 0.7 % (ref 0.0–3.0)
EOS ABS: 0.1 10*3/uL (ref 0.0–0.7)
EOS PCT: 1.7 % (ref 0.0–5.0)
HCT: 39.5 % (ref 36.0–46.0)
Hemoglobin: 12.7 g/dL (ref 12.0–15.0)
Lymphocytes Relative: 36.8 % (ref 12.0–46.0)
Lymphs Abs: 1.5 10*3/uL (ref 0.7–4.0)
MCHC: 32.1 g/dL (ref 30.0–36.0)
MCV: 79.4 fl (ref 78.0–100.0)
Monocytes Absolute: 0.4 10*3/uL (ref 0.1–1.0)
Monocytes Relative: 9.9 % (ref 3.0–12.0)
Neutro Abs: 2.1 10*3/uL (ref 1.4–7.7)
Neutrophils Relative %: 50.9 % (ref 43.0–77.0)
Platelets: 253 10*3/uL (ref 150.0–400.0)
RBC: 4.97 Mil/uL (ref 3.87–5.11)
RDW: 23.6 % — ABNORMAL HIGH (ref 11.5–15.5)
WBC: 4.1 10*3/uL (ref 4.0–10.5)

## 2013-07-08 LAB — IBC PANEL
Iron: 300 ug/dL — ABNORMAL HIGH (ref 42–145)
Saturation Ratios: 62 % — ABNORMAL HIGH (ref 20.0–50.0)
TRANSFERRIN: 345.6 mg/dL (ref 212.0–360.0)

## 2013-07-08 LAB — FERRITIN: FERRITIN: 6.9 ng/mL — AB (ref 10.0–291.0)

## 2013-07-08 NOTE — Telephone Encounter (Signed)
Message copied by Barron Alvine on Thu Jul 08, 2013  8:07 AM ------      Message from: Barron Alvine      Created: Tue Jun 08, 2013  1:28 PM       Pt to get labs  ------

## 2013-07-08 NOTE — Telephone Encounter (Signed)
Pt has been reminded to have labs and will come in this morning

## 2013-07-14 ENCOUNTER — Telehealth: Payer: Self-pay | Admitting: Gastroenterology

## 2013-07-14 NOTE — Telephone Encounter (Signed)
The pt was notified that the results have not been reviewed.  I will call as soon as available

## 2013-07-21 ENCOUNTER — Telehealth: Payer: Self-pay | Admitting: Gastroenterology

## 2013-07-21 ENCOUNTER — Other Ambulatory Visit (INDEPENDENT_AMBULATORY_CARE_PROVIDER_SITE_OTHER): Payer: BC Managed Care – PPO

## 2013-07-21 ENCOUNTER — Ambulatory Visit (INDEPENDENT_AMBULATORY_CARE_PROVIDER_SITE_OTHER): Payer: BC Managed Care – PPO | Admitting: Internal Medicine

## 2013-07-21 ENCOUNTER — Encounter: Payer: Self-pay | Admitting: Internal Medicine

## 2013-07-21 VITALS — BP 112/76 | HR 68 | Temp 98.0°F | Ht 62.5 in | Wt 130.0 lb

## 2013-07-21 DIAGNOSIS — R6882 Decreased libido: Secondary | ICD-10-CM | POA: Insufficient documentation

## 2013-07-21 DIAGNOSIS — R5381 Other malaise: Secondary | ICD-10-CM

## 2013-07-21 DIAGNOSIS — R5383 Other fatigue: Secondary | ICD-10-CM

## 2013-07-21 DIAGNOSIS — R42 Dizziness and giddiness: Secondary | ICD-10-CM

## 2013-07-21 DIAGNOSIS — D649 Anemia, unspecified: Secondary | ICD-10-CM

## 2013-07-21 DIAGNOSIS — R079 Chest pain, unspecified: Secondary | ICD-10-CM

## 2013-07-21 LAB — CBC WITH DIFFERENTIAL/PLATELET
Basophils Absolute: 0 10*3/uL (ref 0.0–0.1)
Basophils Relative: 0.5 % (ref 0.0–3.0)
Eosinophils Absolute: 0.1 10*3/uL (ref 0.0–0.7)
Eosinophils Relative: 1.2 % (ref 0.0–5.0)
HCT: 39.5 % (ref 36.0–46.0)
Hemoglobin: 12.7 g/dL (ref 12.0–15.0)
Lymphocytes Relative: 33.7 % (ref 12.0–46.0)
Lymphs Abs: 1.7 10*3/uL (ref 0.7–4.0)
MCHC: 32.3 g/dL (ref 30.0–36.0)
MCV: 81.1 fl (ref 78.0–100.0)
Monocytes Absolute: 0.3 10*3/uL (ref 0.1–1.0)
Monocytes Relative: 7 % (ref 3.0–12.0)
Neutro Abs: 2.8 10*3/uL (ref 1.4–7.7)
Neutrophils Relative %: 57.6 % (ref 43.0–77.0)
Platelets: 232 10*3/uL (ref 150.0–400.0)
RBC: 4.87 Mil/uL (ref 3.87–5.11)
RDW: 21.8 % — ABNORMAL HIGH (ref 11.5–15.5)
WBC: 4.9 10*3/uL (ref 4.0–10.5)

## 2013-07-21 NOTE — Telephone Encounter (Signed)
The pt is calling to say she is still dizzy, and pale her labs have been improving.  She has also contacted her PCP.  She has a follow up appt with Ardis Hughs on 07/30/13.  Is this ok?  Do you want her seen sooner?

## 2013-07-21 NOTE — Assessment & Plan Note (Addendum)
ECG reviewed as per emr, atypical, for stress test

## 2013-07-21 NOTE — Progress Notes (Signed)
Pre visit review using our clinic review tool, if applicable. No additional management support is needed unless otherwise documented below in the visit note. 
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
## 2013-07-21 NOTE — Patient Instructions (Addendum)
Your EKG was OK today  Your most recent lab work was discussed today  Please go to the XRAY Department in the Basement (go straight as you get off the elevator) for the x-ray testing at your convenience  You will be contacted regarding the referral for: echocardiogram, carotid dopplers, MRI head, and stress test

## 2013-07-21 NOTE — Telephone Encounter (Signed)
Last Hb was normal.  I don't think she is dizzy from anemia.  Lets repeat CBC today.

## 2013-07-21 NOTE — Telephone Encounter (Signed)
Pt will come in for labs

## 2013-07-24 DIAGNOSIS — R42 Dizziness and giddiness: Secondary | ICD-10-CM | POA: Insufficient documentation

## 2013-07-24 NOTE — Assessment & Plan Note (Signed)
?   Related to anxiety/depression - pt denies,  to f/u any worsening symptoms or concerns

## 2013-07-24 NOTE — Assessment & Plan Note (Addendum)
Etiology unclear, for stress test, echo, dopplers, Head MRI, consider neuro referral, cont hct for now

## 2013-07-24 NOTE — Assessment & Plan Note (Signed)
Etiology unclear, for lab eval,  to f/u any worsening symptoms or concerns

## 2013-07-26 ENCOUNTER — Ambulatory Visit (INDEPENDENT_AMBULATORY_CARE_PROVIDER_SITE_OTHER)
Admission: RE | Admit: 2013-07-26 | Discharge: 2013-07-26 | Disposition: A | Discharge status: Home / Self Care | Payer: BC Managed Care – PPO | Source: Ambulatory Visit | Attending: Internal Medicine | Admitting: Internal Medicine

## 2013-07-26 ENCOUNTER — Other Ambulatory Visit (HOSPITAL_COMMUNITY): Payer: Self-pay | Admitting: Cardiology

## 2013-07-26 DIAGNOSIS — R42 Dizziness and giddiness: Secondary | ICD-10-CM

## 2013-07-26 DIAGNOSIS — R079 Chest pain, unspecified: Secondary | ICD-10-CM

## 2013-07-26 DIAGNOSIS — R5381 Other malaise: Secondary | ICD-10-CM

## 2013-07-26 DIAGNOSIS — R5383 Other fatigue: Secondary | ICD-10-CM

## 2013-07-30 ENCOUNTER — Ambulatory Visit: Payer: BC Managed Care – PPO | Admitting: Gastroenterology

## 2013-07-30 ENCOUNTER — Ambulatory Visit (HOSPITAL_COMMUNITY): Payer: BC Managed Care – PPO | Attending: Internal Medicine | Admitting: Cardiology

## 2013-07-30 DIAGNOSIS — R42 Dizziness and giddiness: Secondary | ICD-10-CM

## 2013-07-30 NOTE — Progress Notes (Signed)
Carotid duplex performed 

## 2013-08-02 ENCOUNTER — Ambulatory Visit
Admission: RE | Admit: 2013-08-02 | Discharge: 2013-08-02 | Disposition: A | Discharge status: Home / Self Care | Payer: BC Managed Care – PPO | Source: Ambulatory Visit | Attending: Internal Medicine | Admitting: Internal Medicine

## 2013-08-02 DIAGNOSIS — R5381 Other malaise: Secondary | ICD-10-CM

## 2013-08-02 DIAGNOSIS — R5383 Other fatigue: Secondary | ICD-10-CM

## 2013-08-02 DIAGNOSIS — R079 Chest pain, unspecified: Secondary | ICD-10-CM

## 2013-08-17 ENCOUNTER — Ambulatory Visit (HOSPITAL_BASED_OUTPATIENT_CLINIC_OR_DEPARTMENT_OTHER): Payer: BC Managed Care – PPO | Admitting: Radiology

## 2013-08-17 ENCOUNTER — Ambulatory Visit (HOSPITAL_COMMUNITY): Payer: BC Managed Care – PPO | Attending: Cardiology | Admitting: Radiology

## 2013-08-17 ENCOUNTER — Other Ambulatory Visit (HOSPITAL_COMMUNITY): Payer: BC Managed Care – PPO

## 2013-08-17 ENCOUNTER — Encounter (HOSPITAL_COMMUNITY): Payer: BC Managed Care – PPO

## 2013-08-17 VITALS — BP 128/102 | HR 59 | Ht 62.0 in | Wt 127.0 lb

## 2013-08-17 DIAGNOSIS — R5383 Other fatigue: Principal | ICD-10-CM

## 2013-08-17 DIAGNOSIS — R079 Chest pain, unspecified: Secondary | ICD-10-CM

## 2013-08-17 DIAGNOSIS — R072 Precordial pain: Secondary | ICD-10-CM

## 2013-08-17 DIAGNOSIS — R5381 Other malaise: Secondary | ICD-10-CM | POA: Insufficient documentation

## 2013-08-17 DIAGNOSIS — R42 Dizziness and giddiness: Secondary | ICD-10-CM | POA: Insufficient documentation

## 2013-08-17 DIAGNOSIS — Z8249 Family history of ischemic heart disease and other diseases of the circulatory system: Secondary | ICD-10-CM | POA: Insufficient documentation

## 2013-08-17 MED ORDER — TECHNETIUM TC 99M SESTAMIBI GENERIC - CARDIOLITE
10.0000 | Freq: Once | INTRAVENOUS | Status: AC | PRN
  Administered 2013-08-17: 10 via INTRAVENOUS

## 2013-08-17 MED ORDER — REGADENOSON 0.4 MG/5ML IV SOLN
0.4000 mg | Freq: Once | INTRAVENOUS | Status: AC
  Administered 2013-08-17: 0.4 mg via INTRAVENOUS

## 2013-08-17 MED ORDER — TECHNETIUM TC 99M SESTAMIBI GENERIC - CARDIOLITE
30.0000 | Freq: Once | INTRAVENOUS | Status: AC | PRN
  Administered 2013-08-17: 30 via INTRAVENOUS

## 2013-08-17 NOTE — Progress Notes (Signed)
Echocardiogram performed.  

## 2013-08-17 NOTE — Progress Notes (Signed)
[facility] [place], [place] [address] [phone]    Cardiology Nuclear Med Study  Bethany Carter is a 63 y.o. female     MRN : [id number]     DOB: [birth year]  Procedure Date: 08/17/2013  Nuclear Med Background Indication for Stress Test:  Evaluation for Ischemia History:  No known CAD, Echo 2015 (pending) Cardiac Risk Factors: Family History - CAD  Symptoms:  Chest Pain, Dizziness and Fatigue   Nuclear Pre-Procedure Caffeine/Decaff Intake:  None NPO After: 8:30am   Lungs:  clear O2 Sat: 98% on room air. IV 0.9% NS with Angio Cath:  22g  IV Site: R Hand  IV Started by:  Matilde Haymaker, RN  Chest Size (in):  34 Cup Size: DD  Height: 5\' 2"  (1.575 m)  Weight:  127 lb (57.607 kg)  BMI:  Body mass index is 23.22 kg/(m^2). Tech Comments:  n/a    Nuclear Med Study 1 or 2 day study: 1 day  Stress Test Type:  Lexiscan  Reading MD: n/a  Order Authorizing Provider:  Waynard Edwards  Resting Radionuclide: Technetium 31m Sestamibi  Resting Radionuclide Dose: 11.0 mCi   Stress Radionuclide:  Technetium 46m Sestamibi  Stress Radionuclide Dose: 33.0 mCi           Stress Protocol Rest HR: 59 Stress HR: 96  Rest BP: 128/102 Stress BP: 117/73  Exercise Time (min): n/a METS: n/a           Dose of Adenosine (mg):  n/a Dose of Lexiscan: 0.4 mg  Dose of Atropine (mg): n/a Dose of Dobutamine: n/a mcg/kg/min (at max HR)  Stress Test Technologist: Glade Lloyd, BS-ES  Nuclear Technologist:  Annye Rusk, CNMT     Rest Procedure:  Myocardial perfusion imaging was performed at rest 45 minutes following the intravenous administration of Technetium 47m Sestamibi. Rest ECG: NSR - Normal EKG  Stress Procedure:  The patient received IV Lexiscan 0.4 mg over 15-seconds.  Technetium 69m Sestamibi injected at 30-seconds.  Quantitative spect images were obtained after a 45 minute delay.  During the infusion of Lexiscan the patient complained of  lightheadedness, SOB, midsternum pain, stomach pain and headache.  Symptoms slowly began to resolve in recovery.  Stress ECG: No significant change from baseline ECG  QPS Raw Data Images:  Prominant breast tissue Stress Images:  There is decreased uptake in the anterior wall. Rest Images:  Normal homogeneous uptake in all areas of the myocardium. Subtraction (SDS):  These findings are consistent with ischemia. Transient Ischemic Dilatation (Normal <1.22):  1.04 Lung/Heart Ratio (Normal <0.45):  0.32  Quantitative Gated Spect Images QGS EDV:  67 ml QGS ESV:  17 ml  Impression Exercise Capacity:  Lexiscan with no exercise. BP Response:  Normal blood pressure response. Clinical Symptoms:  Headache and lightheaded ECG Impression:  No significant ST segment change suggestive of ischemia. Comparison with Prior Nuclear Study: No previous nuclear study performed  Overall Impression:  Abnormal myovue suggesting mid and apical wall ischemia Prominant breast tissue decreases specificity of findings  LV Ejection Fraction: 75%.  LV Wall Motion:  NL LV Function; NL Wall Motion      Jenkins Rouge

## 2013-08-18 ENCOUNTER — Other Ambulatory Visit: Payer: Self-pay | Admitting: Internal Medicine

## 2013-08-18 DIAGNOSIS — R9439 Abnormal result of other cardiovascular function study: Secondary | ICD-10-CM

## 2013-08-18 DIAGNOSIS — R079 Chest pain, unspecified: Secondary | ICD-10-CM

## 2013-08-23 ENCOUNTER — Encounter: Payer: Self-pay | Admitting: *Deleted

## 2013-08-25 ENCOUNTER — Encounter: Payer: Self-pay | Admitting: Cardiology

## 2013-08-25 ENCOUNTER — Encounter: Payer: Self-pay | Admitting: *Deleted

## 2013-08-25 ENCOUNTER — Ambulatory Visit (INDEPENDENT_AMBULATORY_CARE_PROVIDER_SITE_OTHER): Payer: BC Managed Care – PPO | Admitting: Cardiology

## 2013-08-25 VITALS — BP 122/80 | HR 80 | Ht 62.0 in | Wt 130.0 lb

## 2013-08-25 DIAGNOSIS — R5383 Other fatigue: Secondary | ICD-10-CM | POA: Insufficient documentation

## 2013-08-25 DIAGNOSIS — R42 Dizziness and giddiness: Secondary | ICD-10-CM

## 2013-08-25 DIAGNOSIS — R079 Chest pain, unspecified: Secondary | ICD-10-CM

## 2013-08-25 DIAGNOSIS — R5381 Other malaise: Secondary | ICD-10-CM

## 2013-08-25 DIAGNOSIS — R5382 Chronic fatigue, unspecified: Secondary | ICD-10-CM

## 2013-08-25 DIAGNOSIS — R9439 Abnormal result of other cardiovascular function study: Secondary | ICD-10-CM

## 2013-08-25 DIAGNOSIS — D509 Iron deficiency anemia, unspecified: Secondary | ICD-10-CM

## 2013-08-25 LAB — CBC WITH DIFFERENTIAL/PLATELET
Basophils Absolute: 0 10*3/uL (ref 0.0–0.1)
Basophils Relative: 0.5 % (ref 0.0–3.0)
Eosinophils Absolute: 0 10*3/uL (ref 0.0–0.7)
Eosinophils Relative: 0.7 % (ref 0.0–5.0)
HCT: 38.9 % (ref 36.0–46.0)
Hemoglobin: 12.5 g/dL (ref 12.0–15.0)
Lymphocytes Relative: 20.9 % (ref 12.0–46.0)
Lymphs Abs: 1.4 10*3/uL (ref 0.7–4.0)
MCHC: 32.1 g/dL (ref 30.0–36.0)
MCV: 84.9 fl (ref 78.0–100.0)
Monocytes Absolute: 0.4 10*3/uL (ref 0.1–1.0)
Monocytes Relative: 5.6 % (ref 3.0–12.0)
Neutro Abs: 4.9 10*3/uL (ref 1.4–7.7)
Neutrophils Relative %: 72.3 % (ref 43.0–77.0)
Platelets: 226 10*3/uL (ref 150.0–400.0)
RBC: 4.59 Mil/uL (ref 3.87–5.11)
RDW: 14.7 % (ref 11.5–15.5)
WBC: 6.8 10*3/uL (ref 4.0–10.5)

## 2013-08-25 LAB — BASIC METABOLIC PANEL
BUN: 23 mg/dL (ref 6–23)
CO2: 30 mEq/L (ref 19–32)
Calcium: 10.7 mg/dL — ABNORMAL HIGH (ref 8.4–10.5)
Chloride: 104 mEq/L (ref 96–112)
Creatinine, Ser: 0.6 mg/dL (ref 0.4–1.2)
GFR: 113.84 mL/min (ref >60.00)
Glucose, Bld: 98 mg/dL (ref 70–99)
Potassium: 4 mEq/L (ref 3.5–5.1)
Sodium: 139 mEq/L (ref 135–145)

## 2013-08-25 LAB — PROTIME-INR
INR: 1 ratio (ref 0.8–1.0)
Prothrombin Time: 11.5 s (ref 9.6–13.1)

## 2013-08-25 LAB — CORTISOL: Cortisol, Plasma: 5.6 ug/dL

## 2013-08-25 LAB — T4, FREE: Free T4: 0.98 ng/dL (ref 0.60–1.60)

## 2013-08-25 LAB — T3, FREE: T3, Free: 2.3 pg/mL (ref 2.3–4.2)

## 2013-08-25 LAB — TSH: TSH: 0.8 u[IU]/mL (ref 0.35–4.50)

## 2013-08-25 LAB — APTT: aPTT: 30.4 s — ABNORMAL HIGH (ref 21.7–28.8)
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
## 2013-08-25 NOTE — Patient Instructions (Signed)
Your physician recommends that you continue on your current medications as directed. Please refer to the Current Medication list given to you today.  Your physician has requested that you have a cardiac catheterization. Cardiac catheterization is used to diagnose and/or treat various heart conditions. Doctors may recommend this procedure for a number of different reasons. The most common reason is to evaluate chest pain. Chest pain can be a symptom of coronary artery disease (CAD), and cardiac catheterization can show whether plaque is narrowing or blocking your heart's arteries. This procedure is also used to evaluate the valves, as well as measure the blood flow and oxygen levels in different parts of your heart. For further information please visit [facility]. Please follow instruction sheet, as given.  YOUR CATH IS SCHEDULED FOR NEXT Wednesday September 01, 2013 WITH DR COOPER AT 6:30 AM. PLEASE SEE INSTRUCTION LETTER ATTACHED FOR FURTHER INFORMATION  Your physician recommends that you return for lab work in: TODAY (TSH, T3 &4 FREE, CORTISOL LEVELS, PT, PTT, CBC W DIFF, NMR W LIPIDS, LIPOPROTEIN A, BMP)  Your physician recommends that you schedule a follow-up appointment in: [address] [place]

## 2013-08-26 ENCOUNTER — Encounter (HOSPITAL_COMMUNITY): Payer: Self-pay | Admitting: Pharmacy Technician

## 2013-08-26 LAB — NMR LIPOPROFILE WITH LIPIDS
Cholesterol, Total: 171 mg/dL (ref <200)
HDL Particle Number: 39.8 umol/L (ref >=30.5)
HDL Size: 9.8 nm (ref >=9.2)
HDL-C: 74 mg/dL (ref >=40)
LDL (calc): 82 mg/dL (ref <100)
LDL Particle Number: 867 nmol/L (ref <1000)
LDL Size: 20.9 nm (ref >20.5)
LP-IR Score: 33 (ref <=45)
Large HDL-P: 14.1 umol/L (ref >=4.8)
Large VLDL-P: 2.8 nmol/L — ABNORMAL HIGH (ref <=2.7)
Small LDL Particle Number: 336 nmol/L (ref <=527)
Triglycerides: 75 mg/dL (ref <150)
VLDL Size: 47.3 nm — ABNORMAL HIGH (ref <=46.6)

## 2013-08-26 LAB — LIPOPROTEIN A (LPA): Lipoprotein (a): 5 mg/dL (ref 0–30)

## 2013-08-27 ENCOUNTER — Other Ambulatory Visit: Payer: Self-pay

## 2013-08-27 ENCOUNTER — Emergency Department (HOSPITAL_COMMUNITY): Payer: BC Managed Care – PPO

## 2013-08-27 ENCOUNTER — Encounter (HOSPITAL_COMMUNITY): Payer: Self-pay | Admitting: Emergency Medicine

## 2013-08-27 ENCOUNTER — Inpatient Hospital Stay (HOSPITAL_COMMUNITY)
Admission: EM | Admit: 2013-08-27 | Discharge: 2013-08-31 | DRG: 287 | Disposition: A | Discharge status: Home / Self Care | Payer: BC Managed Care – PPO | Attending: Internal Medicine | Admitting: Internal Medicine

## 2013-08-27 DIAGNOSIS — R9439 Abnormal result of other cardiovascular function study: Secondary | ICD-10-CM | POA: Diagnosis present

## 2013-08-27 DIAGNOSIS — I2589 Other forms of chronic ischemic heart disease: Secondary | ICD-10-CM | POA: Diagnosis present

## 2013-08-27 DIAGNOSIS — R0789 Other chest pain: Principal | ICD-10-CM | POA: Diagnosis present

## 2013-08-27 DIAGNOSIS — R931 Abnormal findings on diagnostic imaging of heart and coronary circulation: Secondary | ICD-10-CM | POA: Diagnosis present

## 2013-08-27 DIAGNOSIS — R1013 Epigastric pain: Secondary | ICD-10-CM

## 2013-08-27 DIAGNOSIS — Z885 Allergy status to narcotic agent status: Secondary | ICD-10-CM

## 2013-08-27 DIAGNOSIS — R079 Chest pain, unspecified: Secondary | ICD-10-CM | POA: Diagnosis present

## 2013-08-27 DIAGNOSIS — Z7982 Long term (current) use of aspirin: Secondary | ICD-10-CM

## 2013-08-27 DIAGNOSIS — Z9089 Acquired absence of other organs: Secondary | ICD-10-CM

## 2013-08-27 DIAGNOSIS — Q445 Other congenital malformations of bile ducts: Secondary | ICD-10-CM

## 2013-08-27 DIAGNOSIS — R5381 Other malaise: Secondary | ICD-10-CM

## 2013-08-27 DIAGNOSIS — Z800 Family history of malignant neoplasm of digestive organs: Secondary | ICD-10-CM

## 2013-08-27 DIAGNOSIS — R932 Abnormal findings on diagnostic imaging of liver and biliary tract: Secondary | ICD-10-CM

## 2013-08-27 DIAGNOSIS — D509 Iron deficiency anemia, unspecified: Secondary | ICD-10-CM | POA: Diagnosis present

## 2013-08-27 DIAGNOSIS — G8929 Other chronic pain: Secondary | ICD-10-CM

## 2013-08-27 DIAGNOSIS — K2970 Gastritis, unspecified, without bleeding: Secondary | ICD-10-CM | POA: Diagnosis present

## 2013-08-27 DIAGNOSIS — IMO0002 Reserved for concepts with insufficient information to code with codable children: Secondary | ICD-10-CM | POA: Diagnosis not present

## 2013-08-27 DIAGNOSIS — Z79899 Other long term (current) drug therapy: Secondary | ICD-10-CM

## 2013-08-27 DIAGNOSIS — R748 Abnormal levels of other serum enzymes: Secondary | ICD-10-CM

## 2013-08-27 DIAGNOSIS — Z888 Allergy status to other drugs, medicaments and biological substances status: Secondary | ICD-10-CM

## 2013-08-27 DIAGNOSIS — Q441 Other congenital malformations of gallbladder: Secondary | ICD-10-CM

## 2013-08-27 DIAGNOSIS — K2990 Gastroduodenitis, unspecified, without bleeding: Secondary | ICD-10-CM

## 2013-08-27 DIAGNOSIS — Q4479 Other congenital malformations of liver: Secondary | ICD-10-CM

## 2013-08-27 DIAGNOSIS — Y840 Cardiac catheterization as the cause of abnormal reaction of the patient, or of later complication, without mention of misadventure at the time of the procedure: Secondary | ICD-10-CM | POA: Diagnosis not present

## 2013-08-27 DIAGNOSIS — Z8249 Family history of ischemic heart disease and other diseases of the circulatory system: Secondary | ICD-10-CM

## 2013-08-27 DIAGNOSIS — I201 Angina pectoris with documented spasm: Secondary | ICD-10-CM

## 2013-08-27 DIAGNOSIS — Q447 Other congenital malformations of liver: Secondary | ICD-10-CM

## 2013-08-27 DIAGNOSIS — R933 Abnormal findings on diagnostic imaging of other parts of digestive tract: Secondary | ICD-10-CM

## 2013-08-27 DIAGNOSIS — K7689 Other specified diseases of liver: Secondary | ICD-10-CM | POA: Diagnosis present

## 2013-08-27 DIAGNOSIS — R5383 Other fatigue: Secondary | ICD-10-CM

## 2013-08-27 HISTORY — DX: Iron deficiency anemia, unspecified: D50.9

## 2013-08-27 LAB — CBC WITH DIFFERENTIAL/PLATELET
BASOS ABS: 0 10*3/uL (ref 0.0–0.1)
Basophils Relative: 0 % (ref 0–1)
EOS ABS: 0 10*3/uL (ref 0.0–0.7)
EOS PCT: 0 % (ref 0–5)
HEMATOCRIT: 38.8 % (ref 36.0–46.0)
Hemoglobin: 12.6 g/dL (ref 12.0–15.0)
Lymphocytes Relative: 24 % (ref 12–46)
Lymphs Abs: 1.3 10*3/uL (ref 0.7–4.0)
MCH: 27.6 pg (ref 26.0–34.0)
MCHC: 32.5 g/dL (ref 30.0–36.0)
MCV: 84.9 fL (ref 78.0–100.0)
Monocytes Absolute: 0.3 10*3/uL (ref 0.1–1.0)
Monocytes Relative: 5 % (ref 3–12)
Neutro Abs: 3.8 10*3/uL (ref 1.7–7.7)
Neutrophils Relative %: 71 % (ref 43–77)
PLATELETS: 190 10*3/uL (ref 150–400)
RBC: 4.57 MIL/uL (ref 3.87–5.11)
RDW: 14.4 % (ref 11.5–15.5)
WBC: 5.4 10*3/uL (ref 4.0–10.5)

## 2013-08-27 LAB — BASIC METABOLIC PANEL
ANION GAP: 16 — AB (ref 5–15)
BUN: 16 mg/dL (ref 6–23)
CALCIUM: 10.6 mg/dL — AB (ref 8.4–10.5)
CO2: 22 meq/L (ref 19–32)
Chloride: 104 mEq/L (ref 96–112)
Creatinine, Ser: 0.61 mg/dL (ref 0.50–1.10)
GFR calc Af Amer: >90 mL/min (ref >90)
GFR calc non Af Amer: >90 mL/min (ref >90)
Glucose, Bld: 137 mg/dL — ABNORMAL HIGH (ref 70–99)
Potassium: 3.6 mEq/L — ABNORMAL LOW (ref 3.7–5.3)
Sodium: 142 mEq/L (ref 137–147)

## 2013-08-27 LAB — LIPASE, BLOOD: LIPASE: 145 U/L — AB (ref 11–59)

## 2013-08-27 LAB — TROPONIN I
Troponin I: <0.3 ng/mL (ref <0.30)
Troponin I: <0.3 ng/mL (ref <0.30)

## 2013-08-27 MED ORDER — ASPIRIN EC 81 MG PO TBEC
81.0000 mg | DELAYED_RELEASE_TABLET | Freq: Every day | ORAL | Status: DC
  Administered 2013-08-28 – 2013-08-31 (×4): 81 mg via ORAL
  Filled 2013-08-27 (×4): qty 1

## 2013-08-27 MED ORDER — LORATADINE 10 MG PO TABS
10.0000 mg | ORAL_TABLET | Freq: Every day | ORAL | Status: DC | PRN
Start: 2013-08-27 — End: 2013-08-31
  Filled 2013-08-27: qty 1

## 2013-08-27 MED ORDER — PANTOPRAZOLE SODIUM 40 MG PO TBEC
40.0000 mg | DELAYED_RELEASE_TABLET | Freq: Every day | ORAL | Status: DC
  Administered 2013-08-27 – 2013-08-31 (×4): 40 mg via ORAL
  Filled 2013-08-27 (×4): qty 1

## 2013-08-27 MED ORDER — VITAMIN D3 25 MCG (1000 UNIT) PO TABS
1000.0000 [IU] | ORAL_TABLET | Freq: Every day | ORAL | Status: DC
  Administered 2013-08-28 – 2013-08-31 (×4): 1000 [IU] via ORAL
  Filled 2013-08-27 (×4): qty 1

## 2013-08-27 MED ORDER — NITROGLYCERIN 0.4 MG SL SUBL
0.4000 mg | SUBLINGUAL_TABLET | SUBLINGUAL | Status: DC | PRN
  Filled 2013-08-27: qty 1

## 2013-08-27 MED ORDER — IOHEXOL 350 MG/ML SOLN
100.0000 mL | Freq: Once | INTRAVENOUS | Status: AC | PRN
  Administered 2013-08-27: 100 mL via INTRAVENOUS

## 2013-08-27 MED ORDER — POLYVINYL ALCOHOL 1.4 % OP SOLN
1.0000 [drp] | OPHTHALMIC | Status: DC | PRN
  Filled 2013-08-27: qty 15

## 2013-08-27 MED ORDER — ASPIRIN 81 MG PO CHEW
324.0000 mg | CHEWABLE_TABLET | Freq: Once | ORAL | Status: AC
  Administered 2013-08-27: 324 mg via ORAL
  Filled 2013-08-27: qty 4

## 2013-08-27 MED ORDER — ACETAMINOPHEN 500 MG PO TABS
500.0000 mg | ORAL_TABLET | Freq: Three times a day (TID) | ORAL | Status: DC | PRN
  Administered 2013-08-29 – 2013-08-31 (×3): 500 mg via ORAL
  Filled 2013-08-27 (×3): qty 1

## 2013-08-27 MED ORDER — FENTANYL CITRATE 0.05 MG/ML IJ SOLN
50.0000 ug | Freq: Once | INTRAMUSCULAR | Status: AC
  Administered 2013-08-27: 18:00:00 via INTRAVENOUS
  Filled 2013-08-27: qty 2

## 2013-08-27 MED ORDER — POTASSIUM CHLORIDE CRYS ER 20 MEQ PO TBCR
20.0000 meq | EXTENDED_RELEASE_TABLET | Freq: Once | ORAL | Status: AC
  Administered 2013-08-27: 20 meq via ORAL
  Filled 2013-08-27: qty 1

## 2013-08-27 MED ORDER — ONDANSETRON HCL 4 MG/2ML IJ SOLN: 4.0000 mg | Freq: Four times a day (QID) | INTRAMUSCULAR | Status: DC | PRN

## 2013-08-27 MED ORDER — SODIUM CHLORIDE 0.9 % IV BOLUS (SEPSIS)
500.0000 mL | Freq: Once | INTRAVENOUS | Status: AC
  Administered 2013-08-27: 500 mL via INTRAVENOUS

## 2013-08-27 MED ORDER — HYPROMELLOSE (GONIOSCOPIC) 2.5 % OP SOLN: 1.0000 [drp] | Freq: Every day | OPHTHALMIC | Status: DC | PRN

## 2013-08-27 MED ORDER — SODIUM CHLORIDE 0.9 % IJ SOLN: 3.0000 mL | INTRAMUSCULAR | Status: DC | PRN

## 2013-08-27 MED ORDER — SODIUM CHLORIDE 0.9 % IJ SOLN
3.0000 mL | Freq: Two times a day (BID) | INTRAMUSCULAR | Status: DC
  Administered 2013-08-27 – 2013-08-31 (×6): 3 mL via INTRAVENOUS

## 2013-08-27 MED ORDER — SODIUM CHLORIDE 0.9 % IV SOLN: 250.0000 mL | INTRAVENOUS | Status: DC | PRN

## 2013-08-27 MED ORDER — SODIUM CHLORIDE 0.9 % IV BOLUS (SEPSIS): 1000.0000 mL | Freq: Once | INTRAVENOUS | Status: DC

## 2013-08-27 NOTE — ED Notes (Signed)
Pt reports her pain went up "just a little" all in her back, now 3/10.

## 2013-08-27 NOTE — ED Notes (Signed)
Pt. Stated, I was having chest pain with hyperventilating. I've also been dizzy.  Im scheduled for a cath on Wed.

## 2013-08-27 NOTE — Progress Notes (Signed)
Pt cp free. Pt oriented to floor & informed of being in a camera room. Will continue to monitor the pt. Bethany Carter
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
## 2013-08-27 NOTE — Plan of Care (Signed)
Problem: Phase I Progression Outcomes Goal: Anginal pain relieved Outcome: Progressing CP free right now Goal: Aspirin unless contraindicated Outcome: Completed/Met Date Met:  08/27/13 Pt got ASA in ED

## 2013-08-27 NOTE — ED Notes (Signed)
Patient transported to CT 

## 2013-08-28 ENCOUNTER — Inpatient Hospital Stay (HOSPITAL_COMMUNITY): Payer: BC Managed Care – PPO

## 2013-08-28 ENCOUNTER — Other Ambulatory Visit: Payer: Self-pay

## 2013-08-28 DIAGNOSIS — G8929 Other chronic pain: Secondary | ICD-10-CM

## 2013-08-28 DIAGNOSIS — R933 Abnormal findings on diagnostic imaging of other parts of digestive tract: Secondary | ICD-10-CM

## 2013-08-28 DIAGNOSIS — R932 Abnormal findings on diagnostic imaging of liver and biliary tract: Secondary | ICD-10-CM

## 2013-08-28 DIAGNOSIS — R1013 Epigastric pain: Secondary | ICD-10-CM

## 2013-08-28 DIAGNOSIS — R748 Abnormal levels of other serum enzymes: Secondary | ICD-10-CM

## 2013-08-28 LAB — CBC
HCT: 35 % — ABNORMAL LOW (ref 36.0–46.0)
Hemoglobin: 11 g/dL — ABNORMAL LOW (ref 12.0–15.0)
MCH: 27 pg (ref 26.0–34.0)
MCHC: 31.4 g/dL (ref 30.0–36.0)
MCV: 86 fL (ref 78.0–100.0)
Platelets: 171 10*3/uL (ref 150–400)
RBC: 4.07 MIL/uL (ref 3.87–5.11)
RDW: 14.6 % (ref 11.5–15.5)
WBC: 4.1 10*3/uL (ref 4.0–10.5)

## 2013-08-28 LAB — BASIC METABOLIC PANEL
Anion gap: 9 (ref 5–15)
BUN: 15 mg/dL (ref 6–23)
CO2: 25 mEq/L (ref 19–32)
Calcium: 10.1 mg/dL (ref 8.4–10.5)
Chloride: 110 mEq/L (ref 96–112)
Creatinine, Ser: 0.69 mg/dL (ref 0.50–1.10)
Glucose, Bld: 99 mg/dL (ref 70–99)
POTASSIUM: 4.2 meq/L (ref 3.7–5.3)
Sodium: 144 mEq/L (ref 137–147)

## 2013-08-28 LAB — HEPATIC FUNCTION PANEL
ALK PHOS: 70 U/L (ref 39–117)
ALT: 20 U/L (ref 0–35)
AST: 25 U/L (ref 0–37)
Albumin: 3.1 g/dL — ABNORMAL LOW (ref 3.5–5.2)
Bilirubin, Direct: <0.2 mg/dL (ref 0.0–0.3)
TOTAL PROTEIN: 5.6 g/dL — AB (ref 6.0–8.3)
Total Bilirubin: 0.3 mg/dL (ref 0.3–1.2)

## 2013-08-28 LAB — TROPONIN I

## 2013-08-28 LAB — AMYLASE: AMYLASE: 257 U/L — AB (ref 0–105)

## 2013-08-28 MED ORDER — GADOBENATE DIMEGLUMINE 529 MG/ML IV SOLN
13.0000 mL | Freq: Once | INTRAVENOUS | Status: AC | PRN
  Administered 2013-08-28: 13 mL via INTRAVENOUS

## 2013-08-28 NOTE — Progress Notes (Signed)
Patient ID: Bethany Carter, female   DOB: March 20, 1950, 63 y.o.   MRN: [id number]   Patient Name: Bethany Carter Date of Encounter: 08/28/2013     Active Problems:   Chest pain    SUBJECTIVE Chest pain/back pain resolved. Denies fever or chills.   CURRENT MEDS . aspirin EC  81 mg Oral Daily  . cholecalciferol  1,000 Units Oral Daily  . pantoprazole  40 mg Oral Q0600  . sodium chloride  3 mL Intravenous Q12H    OBJECTIVE  Filed Vitals:   08/27/13 1915 08/27/13 2000 08/27/13 2025 08/28/13 0500  BP:  114/66 124/50 97/57  Pulse: 61 65 59 62  Temp:   97.9 F (36.6 C) 97.9 F (36.6 C)  TempSrc:   Oral Oral  Resp: 12 16 18 18   Height:   5\' 2"  (1.575 m)   Weight:   131 lb 3.2 oz (59.512 kg)   SpO2: 100% 100% 100% 96%    Intake/Output Summary (Last 24 hours) at 08/28/13 1039 Last data filed at 08/27/13 2240  Gross per 24 hour  Intake    360 ml  Output    150 ml  Net    210 ml   Filed Weights   08/27/13 2025  Weight: 131 lb 3.2 oz (59.512 kg)    PHYSICAL EXAM  General: Pleasant, NAD. Neuro: Alert and oriented X 3. Moves all extremities spontaneously. Psych: Normal affect. HEENT:  Normal  Neck: Supple without bruits or JVD. Lungs:  Resp regular and unlabored, CTA. Heart: RRR no s3, s4, or murmurs. Abdomen: Soft, non-tender, non-distended, BS + x 4.  Extremities: No clubbing, cyanosis or edema. DP/PT/Radials 2+ and equal bilaterally.  Accessory Clinical Findings  CBC  Recent Labs  08/25/13 1459 08/27/13 1517 08/28/13 0230  WBC 6.8 5.4 4.1  NEUTROABS 4.9 3.8  --   HGB 12.5 12.6 11.0*  HCT 38.9 38.8 35.0*  MCV 84.9 84.9 86.0  PLT 226.0 190 373   Basic Metabolic Panel  Recent Labs  08/27/13 1517 08/28/13 0230  NA 142 144  K 3.6* 4.2  CL 104 110  CO2 22 25  GLUCOSE 137* 99  BUN 16 15  CREATININE 0.61 0.69  CALCIUM 10.6* 10.1   Liver Function Tests  Recent Labs  08/28/13 0715  AST 25  ALT 20  ALKPHOS 70  BILITOT 0.3  PROT 5.6*  ALBUMIN  3.1*    Recent Labs  08/27/13 2104 08/28/13 0715  LIPASE 145*  --   AMYLASE  --  257*   Cardiac Enzymes  Recent Labs  08/27/13 2104 08/28/13 0230 08/28/13 0715  TROPONINI <0.30 <0.30 <0.30   BNP No components found with this basename: POCBNP,  D-Dimer No results found for this basename: DDIMER,  in the last 72 hours Hemoglobin A1C No results found for this basename: HGBA1C,  in the last 72 hours Fasting Lipid Panel  Recent Labs  08/25/13 1459  LDLCALC 82  TRIG 75   Thyroid Function Tests  Recent Labs  08/25/13 1459  TSH 0.80  T3FREE 2.3    TELE  NSR  Radiology/Studies  Mr Brain Wo Contrast  08/02/2013   CLINICAL DATA:  Dizziness and fatigue.  EXAM: MRI HEAD WITHOUT CONTRAST  TECHNIQUE: Multiplanar, multiecho pulse sequences of the brain and surrounding structures were obtained without intravenous contrast.  COMPARISON:  None.  FINDINGS: The brain has a normal appearance on all pulse sequences without evidence of malformation, atrophy, old or acute infarction, mass lesion, hemorrhage, hydrocephalus or  extra-axial collection. No pituitary mass. No fluid in the sinuses, middle ears or mastoids. No skull or skullbase lesion. There is flow in the major vessels at the base of the brain. Major venous sinuses show flow.  IMPRESSION: Normal examination. No cause of the presenting symptoms is identified.   Electronically Signed   By: Nelson Chimes M.D.   On: 08/02/2013 19:11   Dg Chest Port 1 View  08/27/2013   CLINICAL DATA:  Chest pain.  Dizziness.  EXAM: PORTABLE CHEST - 1 VIEW  COMPARISON:  07/26/2013  FINDINGS: Heart size is normal. Mediastinal shadows are normal. The lungs are clear. Artifact overlies the chest. The vascularity is normal. No effusions.  IMPRESSION: No active disease.   Electronically Signed   By: Nelson Chimes M.D.   On: 08/27/2013 16:44   Ct Angio Chest Aortic Dissect W &/or W/o  08/27/2013   CLINICAL DATA:  Chest pain.  Hyperventilated.  EXAM: CT  ANGIOGRAPHY CHEST, ABDOMEN AND PELVIS  TECHNIQUE: Multidetector CT imaging through the chest, abdomen and pelvis was performed using the standard protocol during bolus administration of intravenous contrast. Multiplanar reconstructed images and MIPs were obtained and reviewed to evaluate the vascular anatomy.  CONTRAST:  173mL OMNIPAQUE IOHEXOL 350 MG/ML SOLN  COMPARISON:  None.  FINDINGS: CTA CHEST FINDINGS  No filling defects in the pulmonary arteries to suggest pulmonary emboli. Heart is normal size. Aorta is normal caliber. No dissection. Areas of biapical and bibasilar scarring. No confluent airspace opacities. No effusions.  No mediastinal, hilar, or axillary adenopathy. Chest wall soft tissues are unremarkable. Bilateral breast implants noted.  No acute bony abnormality or focal bone lesion.  Review of the MIP images confirms the above findings.  CTA ABDOMEN AND PELVIS FINDINGS  No evidence of aortic aneurysm or dissection. The iliac and femoral vessels are normal caliber. Mesenteric vessels and renal arteries are widely patent.  Prior cholecystectomy. Common bile duct is dilated, measuring up to 12 mm. Mild prominence of the pancreatic duct. No focal hepatic lesion. Spleen, pancreas, adrenals and kidneys are unremarkable. There is mild soft tissue fullness in the region of the ampulla. Difficult to completely exclude ampullary lesion.  Hypervascular area noted centrally within the liver measuring 14 mm. Small hypervascular area noted posteriorly in the right hepatic lobe. No other focal hepatic lesions.  Scattered sigmoid diverticulosis. No active diverticulitis. Small bowel is decompressed. No free fluid, free air or adenopathy.  Uterus, adnexae and urinary bladder are unremarkable.  No acute bony abnormality or focal bone lesion.  Review of the MIP images confirms the above findings.  IMPRESSION: No evidence of aortic aneurysm or dissection. No evidence of pulmonary embolus.  Small hypervascular areas  within the liver, nonspecific. 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  Prior cholecystectomy. Mild common bile duct and pancreatic ductal dilatation the soft tissue fullness in the region of the ampulla. Recommend correlation with LFTs. This could be further evaluated with MRI of the abdomen with and without contrast.   Electronically Signed   By: Rolm Baptise M.D.   On: 08/27/2013 19:12   Ct Angio Abd/pel W/ And/or W/o  08/27/2013   CLINICAL DATA:  Chest pain.  Hyperventilated.  EXAM: CT ANGIOGRAPHY CHEST, ABDOMEN AND PELVIS  TECHNIQUE: Multidetector CT imaging through the chest, abdomen and pelvis was performed using the standard protocol during bolus administration of intravenous contrast. Multiplanar reconstructed images and MIPs were obtained and reviewed to evaluate the vascular anatomy.  CONTRAST:  133mL OMNIPAQUE IOHEXOL 350  MG/ML SOLN  COMPARISON:  None.  FINDINGS: CTA CHEST FINDINGS  No filling defects in the pulmonary arteries to suggest pulmonary emboli. Heart is normal size. Aorta is normal caliber. No dissection. Areas of biapical and bibasilar scarring. No confluent airspace opacities. No effusions.  No mediastinal, hilar, or axillary adenopathy. Chest wall soft tissues are unremarkable. Bilateral breast implants noted.  No acute bony abnormality or focal bone lesion.  Review of the MIP images confirms the above findings.  CTA ABDOMEN AND PELVIS FINDINGS  No evidence of aortic aneurysm or dissection. The iliac and femoral vessels are normal caliber. Mesenteric vessels and renal arteries are widely patent.  Prior cholecystectomy. Common bile duct is dilated, measuring up to 12 mm. Mild prominence of the pancreatic duct. No focal hepatic lesion. Spleen, pancreas, adrenals and kidneys are unremarkable. There is mild soft tissue fullness in the region of the ampulla. Difficult to completely exclude ampullary lesion.  Hypervascular area noted centrally within the liver measuring 14 mm.  Small hypervascular area noted posteriorly in the right hepatic lobe. No other focal hepatic lesions.  Scattered sigmoid diverticulosis. No active diverticulitis. Small bowel is decompressed. No free fluid, free air or adenopathy.  Uterus, adnexae and urinary bladder are unremarkable.  No acute bony abnormality or focal bone lesion.  Review of the MIP images confirms the above findings.  IMPRESSION: No evidence of aortic aneurysm or dissection. No evidence of pulmonary embolus.  Small hypervascular areas within the liver, nonspecific. 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  Prior cholecystectomy. Mild common bile duct and pancreatic ductal dilatation the soft tissue fullness in the region of the ampulla. Recommend correlation with LFTs. This could be further evaluated with MRI of the abdomen with and without contrast.   Electronically Signed   By: Rolm Baptise M.D.   On: 08/27/2013 19:12    ASSESSMENT AND PLAN 1.chest pain with an abnormal stress test - note plans for heart cath next week.  2. Abnormal CT of the abdomen with dilated common bile duct and elevated amylase. Will ask our GI colleagues for input. Unclear if ERCP indicated. 3. Hypervascular area of the liver - etiology unclear. Will ask GI.  Calisa Luckenbaugh,M.D.  08/28/2013 10:39 AM
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
## 2013-08-28 NOTE — ED Provider Notes (Signed)
Medical screening examination/treatment/procedure(s) were conducted as a shared visit with non-physician practitioner(s) and myself.  I personally evaluated the patient during the encounter.   EKG Interpretation   Date/Time:  Friday August 27 2013 14:59:55 EDT Ventricular Rate:  74 PR Interval:  156 QRS Duration: 82 QT Interval:  384 QTC Calculation: 426 R Axis:   73 Text Interpretation:  Normal sinus rhythm Low voltage QRS Nonspecific ST  and T wave abnormality No significant change since last tracing Confirmed  by Shylynn Bruning  MD, Hope ([id number]) on 08/27/2013 3:58:03 PM      I interviewed and examined the patient. Lungs are CTAB. Cardiac exam wnl. Abdomen soft.  Pt followed by cards, planning cath next week. Cards consulted who will admit.   Blanchard Kelch, MD 08/28/13 [phone]

## 2013-08-29 ENCOUNTER — Encounter (HOSPITAL_COMMUNITY): Payer: Self-pay | Admitting: Physician Assistant

## 2013-08-29 DIAGNOSIS — R079 Chest pain, unspecified: Secondary | ICD-10-CM

## 2013-08-29 MED ORDER — NITROGLYCERIN 0.4 MG SL SUBL: 0.4000 mg | SUBLINGUAL_TABLET | SUBLINGUAL | Status: DC | PRN

## 2013-08-29 NOTE — Progress Notes (Signed)
Patient: Bethany Carter / Admit Date: 08/27/2013 / Date of Encounter: 08/29/2013, 9:49 AM   Subjective: Not having the pain she had had on admission, but woke up with chest pressure several hours ago. Currently comfortable but it's there in the background. Mild headache (h/o such). Didn't sleep well.  Objective:  Telemetry: NSR  Physical Exam: Blood pressure 118/56, pulse 56, temperature 97.6 F (36.4 C), temperature source Oral, resp. rate 16, height 5\' 2"  (1.575 m), weight 131 lb 3.2 oz (59.512 kg), SpO2 100.00%. General: Well developed, well nourished WF in no acute distress. Head: Normocephalic, atraumatic, sclera non-icteric, no xanthomas, nares are without discharge. Neck: Negative for carotid bruits. JVP not elevated. Lungs: Clear bilaterally to auscultation without wheezes, rales, or rhonchi. Breathing is unlabored. Heart: RRR S1 S2 without murmurs, rubs, or gallops.  Abdomen: Soft, non-tender, non-distended with normoactive bowel sounds. No rebound/guarding. Extremities: No clubbing or cyanosis. No edema. Distal pedal pulses are 2+ and equal bilaterally. Neuro: Alert and oriented X 3. Moves all extremities spontaneously. Psych:  Responds to questions appropriately with a normal affect.   Intake/Output Summary (Last 24 hours) at 08/29/13 0949 Last data filed at 08/29/13 0500  Gross per 24 hour  Intake    240 ml  Output   1700 ml  Net  -1460 ml    Inpatient Medications:  . aspirin EC  81 mg Oral Daily  . cholecalciferol  1,000 Units Oral Daily  . pantoprazole  40 mg Oral Q0600  . sodium chloride  3 mL Intravenous Q12H   Infusions:    Labs:  Recent Labs  08/27/13 1517 08/28/13 0230  NA 142 144  K 3.6* 4.2  CL 104 110  CO2 22 25  GLUCOSE 137* 99  BUN 16 15  CREATININE 0.61 0.69  CALCIUM 10.6* 10.1    Recent Labs  08/28/13 0715  AST 25  ALT 20  ALKPHOS 70  BILITOT 0.3  PROT 5.6*  ALBUMIN 3.1*    Recent Labs  08/27/13 1517 08/28/13 0230  WBC 5.4  4.1  NEUTROABS 3.8  --   HGB 12.6 11.0*  HCT 38.8 35.0*  MCV 84.9 86.0  PLT 190 171    Recent Labs  08/27/13 1517 08/27/13 2104 08/28/13 0230 08/28/13 0715  TROPONINI <0.30 <0.30 <0.30 <0.30   No components found with this basename: POCBNP,  No results found for this basename: HGBA1C,  in the last 72 hours   Radiology/Studies:  Mr Brain Wo Contrast  08/02/2013   CLINICAL DATA:  Dizziness and fatigue.  EXAM: MRI HEAD WITHOUT CONTRAST  TECHNIQUE: Multiplanar, multiecho pulse sequences of the brain and surrounding structures were obtained without intravenous contrast.  COMPARISON:  None.  FINDINGS: The brain has a normal appearance on all pulse sequences without evidence of malformation, atrophy, old or acute infarction, mass lesion, hemorrhage, hydrocephalus or extra-axial collection. No pituitary mass. No fluid in the sinuses, middle ears or mastoids. No skull or skullbase lesion. There is flow in the major vessels at the base of the brain. Major venous sinuses show flow.  IMPRESSION: Normal examination. No cause of the presenting symptoms is identified.   Electronically Signed   By: Nelson Chimes M.D.   On: 08/02/2013 19:11   Dg Chest Port 1 View  08/27/2013   CLINICAL DATA:  Chest pain.  Dizziness.  EXAM: PORTABLE CHEST - 1 VIEW  COMPARISON:  07/26/2013  FINDINGS: Heart size is normal. Mediastinal shadows are normal. The lungs are clear. Artifact overlies the chest. The  vascularity is normal. No effusions.  IMPRESSION: No active disease.   Electronically Signed   By: Nelson Chimes M.D.   On: 08/27/2013 16:44   Ct Angio Chest Aortic Dissect W &/or W/o  08/27/2013   CLINICAL DATA:  Chest pain.  Hyperventilated.  EXAM: CT ANGIOGRAPHY CHEST, ABDOMEN AND PELVIS  TECHNIQUE: Multidetector CT imaging through the chest, abdomen and pelvis was performed using the standard protocol during bolus administration of intravenous contrast. Multiplanar reconstructed images and MIPs were obtained and reviewed to  evaluate the vascular anatomy.  CONTRAST:  18mL OMNIPAQUE IOHEXOL 350 MG/ML SOLN  COMPARISON:  None.  FINDINGS: CTA CHEST FINDINGS  No filling defects in the pulmonary arteries to suggest pulmonary emboli. Heart is normal size. Aorta is normal caliber. No dissection. Areas of biapical and bibasilar scarring. No confluent airspace opacities. No effusions.  No mediastinal, hilar, or axillary adenopathy. Chest wall soft tissues are unremarkable. Bilateral breast implants noted.  No acute bony abnormality or focal bone lesion.  Review of the MIP images confirms the above findings.  CTA ABDOMEN AND PELVIS FINDINGS  No evidence of aortic aneurysm or dissection. The iliac and femoral vessels are normal caliber. Mesenteric vessels and renal arteries are widely patent.  Prior cholecystectomy. Common bile duct is dilated, measuring up to 12 mm. Mild prominence of the pancreatic duct. No focal hepatic lesion. Spleen, pancreas, adrenals and kidneys are unremarkable. There is mild soft tissue fullness in the region of the ampulla. Difficult to completely exclude ampullary lesion.  Hypervascular area noted centrally within the liver measuring 14 mm. Small hypervascular area noted posteriorly in the right hepatic lobe. No other focal hepatic lesions.  Scattered sigmoid diverticulosis. No active diverticulitis. Small bowel is decompressed. No free fluid, free air or adenopathy.  Uterus, adnexae and urinary bladder are unremarkable.  No acute bony abnormality or focal bone lesion.  Review of the MIP images confirms the above findings.  IMPRESSION: No evidence of aortic aneurysm or dissection. No evidence of pulmonary embolus.  Small hypervascular areas within the liver, nonspecific. 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  Prior cholecystectomy. Mild common bile duct and pancreatic ductal dilatation the soft tissue fullness in the region of the ampulla. Recommend correlation with LFTs. This could be further evaluated  with MRI of the abdomen with and without contrast.   Electronically Signed   By: Rolm Baptise M.D.   On: 08/27/2013 19:12   Ct Angio Abd/pel W/ And/or W/o  08/27/2013   CLINICAL DATA:  Chest pain.  Hyperventilated.  EXAM: CT ANGIOGRAPHY CHEST, ABDOMEN AND PELVIS  TECHNIQUE: Multidetector CT imaging through the chest, abdomen and pelvis was performed using the standard protocol during bolus administration of intravenous contrast. Multiplanar reconstructed images and MIPs were obtained and reviewed to evaluate the vascular anatomy.  CONTRAST:  116mL OMNIPAQUE IOHEXOL 350 MG/ML SOLN  COMPARISON:  None.  FINDINGS: CTA CHEST FINDINGS  No filling defects in the pulmonary arteries to suggest pulmonary emboli. Heart is normal size. Aorta is normal caliber. No dissection. Areas of biapical and bibasilar scarring. No confluent airspace opacities. No effusions.  No mediastinal, hilar, or axillary adenopathy. Chest wall soft tissues are unremarkable. Bilateral breast implants noted.  No acute bony abnormality or focal bone lesion.  Review of the MIP images confirms the above findings.  CTA ABDOMEN AND PELVIS FINDINGS  No evidence of aortic aneurysm or dissection. The iliac and femoral vessels are normal caliber. Mesenteric vessels and renal arteries are widely patent.  Prior  cholecystectomy. Common bile duct is dilated, measuring up to 12 mm. Mild prominence of the pancreatic duct. No focal hepatic lesion. Spleen, pancreas, adrenals and kidneys are unremarkable. There is mild soft tissue fullness in the region of the ampulla. Difficult to completely exclude ampullary lesion.  Hypervascular area noted centrally within the liver measuring 14 mm. Small hypervascular area noted posteriorly in the right hepatic lobe. No other focal hepatic lesions.  Scattered sigmoid diverticulosis. No active diverticulitis. Small bowel is decompressed. No free fluid, free air or adenopathy.  Uterus, adnexae and urinary bladder are unremarkable.   No acute bony abnormality or focal bone lesion.  Review of the MIP images confirms the above findings.  IMPRESSION: No evidence of aortic aneurysm or dissection. No evidence of pulmonary embolus.  Small hypervascular areas within the liver, nonspecific. 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  Prior cholecystectomy. Mild common bile duct and pancreatic ductal dilatation the soft tissue fullness in the region of the ampulla. Recommend correlation with LFTs. This could be further evaluated with MRI of the abdomen with and without contrast.   Electronically Signed   By: Rolm Baptise M.D.   On: 08/27/2013 19:12     Assessment and Plan  1. Chest pain, question GI (#2), see below. Has ruled out for MI but had recent abnl nuc. 2. Elevated amylase/lipase with dilated CBD, prominent pancreatic duct, ampullary fullness on CT - pending MRCP results per GI to evaluate the ductal anatomy and rule out ampullary mass or pancreatic head mass. 3. Recent abnormal stress test done to investigate fatigue, possibly limited by breast attenuation - note she had not had chest pain up until this admission. Continue aspirin as tolerated. Not on statin due to recent normal lipids. BP controlled. Cardiac cath was scheduled for Wednesday. She had had hours of pain the day of admission with negative troponin. Due to chest pressure this AM, will repeat EKG as a precaution. Will follow GI plans to determine if timing of cath needs to be adjusted. 4. H/o H pylori gastritis 02/2013 with iron deficiency anemia, treated - continue PPI, tolerating aspirin.  Signed, Melina Copa PA-C   Attending Note Patient seen and discussed with PA Dunn. Mild 2/10 dull aching chest discomfort this morning with no other associated symptoms. Troponins previously have been negative. Repeat EKG is pending. Will try SL NG and follow symptoms. Awaiting results of MRCP, pending results will consider timing of heart cath to further evaluate her  abnormal stress test. F/u GI recs.    Carlyle Dolly MD

## 2013-08-29 NOTE — Progress Notes (Signed)
EKG reviewed with MD, some TWI in V2 but suspect this is variable from prior EKGs due to lead placement. Nurse offered NTG but patient said she now felt fine. Will continue to monitor. Dayna Dunn PA-C

## 2013-08-29 NOTE — Progress Notes (Signed)
Daily Rounding Note  08/29/2013, 9:38 AM  LOS: 2 days   SUBJECTIVE:       Feels ok except c/o headache.  Some minor mid sternal pain.  No SOB.  No belly pain.  No nausea.   OBJECTIVE:         Vital signs in last 24 hours:    Temp:  [97.2 F (36.2 C)-97.9 F (36.6 C)] 97.6 F (36.4 C) (07/05 0500) Pulse Rate:  [56-67] 56 (07/05 0500) Resp:  [16-18] 16 (07/05 0500) BP: (102-118)/(56-78) 118/56 mmHg (07/05 0500) SpO2:  [98 %-100 %] 100 % (07/05 0500) Last BM Date: 08/27/13 General: looks well, comfortable   Heart: RRR Chest: clear bil.  Abdomen: soft, NT, BS hypoactive.  No distention  Extremities: no CCE Neuro/Psych:  Pleasant, alert, no tremor or gross weakness.  Speech fluid.   Intake/Output from previous day: 07/04 0701 - 07/05 0700 In: 240 [P.O.:240] Out: 1700 [Urine:1700]  Intake/Output this shift:    Lab Results:  Recent Labs  08/27/13 1517 08/28/13 0230  WBC 5.4 4.1  HGB 12.6 11.0*  HCT 38.8 35.0*  PLT 190 171   BMET  Recent Labs  08/27/13 1517 08/28/13 0230  NA 142 144  K 3.6* 4.2  CL 104 110  CO2 22 25  GLUCOSE 137* 99  BUN 16 15  CREATININE 0.61 0.69  CALCIUM 10.6* 10.1   LFT  Recent Labs  08/28/13 0715  PROT 5.6*  ALBUMIN 3.1*  AST 25  ALT 20  ALKPHOS 70  BILITOT 0.3  BILIDIR <0.2  IBILI NOT CALCULATED    Studies/Results: Ct Angio Chest Aortic Dissect W &/or W/o Ct Angio Abd/pel W/ And/or W/o 08/27/2013   CLINICAL DATA:  Chest pain.  Hyperventilated.  EXAM: CT ANGIOGRAPHY CHEST, ABDOMEN AND PELVIS  TECHNIQUE: Multidetector CT imaging through the chest, abdomen and pelvis was performed using the standard protocol during bolus administration of intravenous contrast. Multiplanar reconstructed images and MIPs were obtained and reviewed to evaluate the vascular anatomy.  CONTRAST:  145mL OMNIPAQUE IOHEXOL 350 MG/ML SOLN  COMPARISON:  None.  FINDINGS: CTA CHEST FINDINGS  No  filling defects in the pulmonary arteries to suggest pulmonary emboli. Heart is normal size. Aorta is normal caliber. No dissection. Areas of biapical and bibasilar scarring. No confluent airspace opacities. No effusions.  No mediastinal, hilar, or axillary adenopathy. Chest wall soft tissues are unremarkable. Bilateral breast implants noted.  No acute bony abnormality or focal bone lesion.  Review of the MIP images confirms the above findings.  CTA ABDOMEN AND PELVIS FINDINGS  No evidence of aortic aneurysm or dissection. The iliac and femoral vessels are normal caliber. Mesenteric vessels and renal arteries are widely patent.  Prior cholecystectomy. Common bile duct is dilated, measuring up to 12 mm. Mild prominence of the pancreatic duct. No focal hepatic lesion. Spleen, pancreas, adrenals and kidneys are unremarkable. There is mild soft tissue fullness in the region of the ampulla. Difficult to completely exclude ampullary lesion.  Hypervascular area noted centrally within the liver measuring 14 mm. Small hypervascular area noted posteriorly in the right hepatic lobe. No other focal hepatic lesions.  Scattered sigmoid diverticulosis. No active diverticulitis. Small bowel is decompressed. No free fluid, free air or adenopathy.  Uterus, adnexae and urinary bladder are unremarkable.  No acute bony abnormality or focal bone lesion.  Review of the MIP images confirms the above findings.  IMPRESSION: No evidence of aortic aneurysm or dissection. No  evidence of pulmonary embolus.  Small hypervascular areas within the liver, nonspecific. 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  Prior cholecystectomy. Mild common bile duct and pancreatic ductal dilatation the soft tissue fullness in the region of the ampulla. Recommend correlation with LFTs. This could be further evaluated with MRI of the abdomen with and without contrast.   Electronically Signed   By: Rolm Baptise M.D.   On: 08/27/2013 19:12     ASSESMENT:  * Abnormal stress test and chest pain. For cardiac cath this coming week.   * Elevated Lipase with dilated CBD, prominent PD, ampullary fullness on CT.   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   * H pylori associated gastritis 02/2013. Aleve may have contributed as well. Likely source of iron def anemia. Anemia resolved. Competed H pylori triple therapy. No home PPI but Protonix added empirically 7/4.      PLAN   *  Await reading of MRCP/MRI. *  Timing of cardiac cath not yet defined, originally was for 7/8.     Azucena Freed  08/29/2013, 9:38 AM Pager: [phone]  GI ATTENDING  Interval history and data reviewed. Patient personally seen and examined. Agree with above. No clinical change. Some vague chest discomfort. Headache has improved. Exam unchanged. MRCP completed but not read. Pending the results of MRCP, may need EUS. We will continue to follow. Discussed with patient. Thank you  Docia Chuck. Geri Seminole., M.D. [facility] of G[facility]enterology

## 2013-08-30 ENCOUNTER — Encounter (HOSPITAL_COMMUNITY)
Admission: EM | Disposition: A | Discharge status: Home / Self Care | Payer: Self-pay | Source: Home / Self Care | Attending: Internal Medicine

## 2013-08-30 DIAGNOSIS — I498 Other specified cardiac arrhythmias: Secondary | ICD-10-CM

## 2013-08-30 DIAGNOSIS — R079 Chest pain, unspecified: Secondary | ICD-10-CM

## 2013-08-30 DIAGNOSIS — K2970 Gastritis, unspecified, without bleeding: Secondary | ICD-10-CM | POA: Diagnosis present

## 2013-08-30 DIAGNOSIS — R931 Abnormal findings on diagnostic imaging of heart and coronary circulation: Secondary | ICD-10-CM | POA: Diagnosis present

## 2013-08-30 DIAGNOSIS — R748 Abnormal levels of other serum enzymes: Secondary | ICD-10-CM

## 2013-08-30 HISTORY — PX: LEFT HEART CATHETERIZATION WITH CORONARY ANGIOGRAM: SHX5451

## 2013-08-30 SURGERY — LEFT HEART CATHETERIZATION WITH CORONARY ANGIOGRAM
Anesthesia: LOCAL

## 2013-08-30 MED ORDER — FENTANYL CITRATE 0.05 MG/ML IJ SOLN
INTRAMUSCULAR | Status: AC
  Filled 2013-08-30: qty 2

## 2013-08-30 MED ORDER — SODIUM CHLORIDE 0.9 % IV SOLN: 1.0000 mL/kg/h | INTRAVENOUS | Status: DC

## 2013-08-30 MED ORDER — SODIUM CHLORIDE 0.9 % IV SOLN
INTRAVENOUS | Status: DC
  Administered 2013-08-30: 10:00:00 via INTRAVENOUS

## 2013-08-30 MED ORDER — NITROGLYCERIN 0.2 MG/ML ON CALL CATH LAB
INTRAVENOUS | Status: AC
  Filled 2013-08-30: qty 1

## 2013-08-30 MED ORDER — GI COCKTAIL ~~LOC~~
30.0000 mL | Freq: Once | ORAL | Status: AC
  Administered 2013-08-30: 30 mL via ORAL
  Filled 2013-08-30: qty 30

## 2013-08-30 MED ORDER — ASPIRIN 81 MG PO CHEW: 81.0000 mg | CHEWABLE_TABLET | ORAL | Status: DC

## 2013-08-30 MED ORDER — SODIUM CHLORIDE 0.9 % IJ SOLN: 3.0000 mL | INTRAMUSCULAR | Status: DC | PRN

## 2013-08-30 MED ORDER — MIDAZOLAM HCL 2 MG/2ML IJ SOLN
INTRAMUSCULAR | Status: AC
  Filled 2013-08-30: qty 2

## 2013-08-30 MED ORDER — SODIUM CHLORIDE 0.9 % IV SOLN: 250.0000 mL | INTRAVENOUS | Status: DC | PRN

## 2013-08-30 MED ORDER — HEPARIN (PORCINE) IN NACL 2-0.9 UNIT/ML-% IJ SOLN
INTRAMUSCULAR | Status: AC
  Filled 2013-08-30: qty 1000

## 2013-08-30 MED ORDER — LIDOCAINE HCL (PF) 1 % IJ SOLN
INTRAMUSCULAR | Status: AC
  Filled 2013-08-30: qty 30

## 2013-08-30 MED ORDER — VERAPAMIL HCL 2.5 MG/ML IV SOLN
INTRAVENOUS | Status: AC
  Filled 2013-08-30: qty 2

## 2013-08-30 MED ORDER — HEPARIN SODIUM (PORCINE) 1000 UNIT/ML IJ SOLN
INTRAMUSCULAR | Status: AC
  Filled 2013-08-30: qty 1

## 2013-08-30 MED ORDER — SODIUM CHLORIDE 0.9 % IJ SOLN: 3.0000 mL | Freq: Two times a day (BID) | INTRAMUSCULAR | Status: DC

## 2013-08-30 MED ORDER — SODIUM CHLORIDE 0.9 % IV SOLN: INTRAVENOUS | Status: AC

## 2013-08-30 NOTE — Interval H&P Note (Signed)
History and Physical Interval Note:  08/30/2013 4:36 PM  Bethany Carter  has presented today for cardiac cath with the diagnosis of chest pain/abnormal stress myoview.  The various methods of treatment have been discussed with the patient and family. After consideration of risks, benefits and other options for treatment, the patient has consented to  Procedure(s): LEFT HEART CATHETERIZATION WITH CORONARY ANGIOGRAM (N/A) as a surgical intervention .  The patient's history has been reviewed, patient examined, no change in status, stable for surgery.  I have reviewed the patient's chart and labs.  Questions were answered to the patient's satisfaction.    Cath Lab Visit (complete for each Cath Lab visit)  Clinical Evaluation Leading to the Procedure:   ACS: No.  Non-ACS:    Anginal Classification: CCS II  Anti-ischemic medical therapy: No Therapy  Non-Invasive Test Results: Intermediate-risk stress test findings: cardiac mortality 1-3%/year  Prior CABG: No previous CABG        [name]

## 2013-08-30 NOTE — Progress Notes (Signed)
Dr. Angelena Form paged and made aware of 3/10 chest pain. Order received.

## 2013-08-30 NOTE — Progress Notes (Signed)
Transferred to [phone]

## 2013-08-30 NOTE — H&P (View-Only) (Signed)
Subjective:  No chest or back pain overnight  Scheduled Meds: . [START ON 08/31/2013] aspirin  81 mg Oral Pre-Cath  . aspirin EC  81 mg Oral Daily  . cholecalciferol  1,000 Units Oral Daily  . pantoprazole  40 mg Oral Q0600  . sodium chloride  3 mL Intravenous Q12H  . sodium chloride  3 mL Intravenous Q12H   Continuous Infusions: . [START ON 08/31/2013] sodium chloride    . sodium chloride 50 mL/hr at 08/30/13 0938   PRN Meds:.sodium chloride, sodium chloride, acetaminophen, loratadine, nitroGLYCERIN, ondansetron (ZOFRAN) IV, polyvinyl alcohol, sodium chloride, sodium chloride   Objective:  Vital Signs in the last 24 hours: Temp:  [97.6 F (36.4 C)-98.1 F (36.7 C)] 97.7 F (36.5 C) (07/06 0530) Pulse Rate:  [58-70] 58 (07/06 0530) Resp:  [15-17] 15 (07/06 0530) BP: (113-124)/(47-90) 124/47 mmHg (07/06 0530) SpO2:  [97 %-99 %] 99 % (07/06 0530)  Intake/Output from previous day:  Intake/Output Summary (Last 24 hours) at 08/30/13 0816 Last data filed at 08/29/13 1015  Gross per 24 hour  Intake      3 ml  Output      0 ml  Net      3 ml    Physical Exam: General appearance: alert, cooperative and no distress Lungs: clear to auscultation bilaterally Heart: regular rate and rhythm   Rate: 58  Rhythm: normal sinus rhythm and sinus bradycardia  Lab Results:  Recent Labs  08/27/13 1517 08/28/13 0230  WBC 5.4 4.1  HGB 12.6 11.0*  PLT 190 171    Recent Labs  08/27/13 1517 08/28/13 0230  NA 142 144  K 3.6* 4.2  CL 104 110  CO2 22 25  GLUCOSE 137* 99  BUN 16 15  CREATININE 0.61 0.69    Recent Labs  08/28/13 0230 08/28/13 0715  TROPONINI <0.30 <0.30   No results found for this basename: INR,  in the last 72 hours  Imaging: Imaging results have been reviewed  Cardiac Studies:  Assessment/Plan:  63 y/o female with recent abnormal stress test done to investigate fatigue, possibly limited by breast attenuation - admitted 08/27/13 with chest and back  pain. She had been scheduled for cath later this week. Amylase and lipase elevated and pt seen by GI. MRI done 08/28/12 showed common bile duct dilatation likely due to prior cholecystectomy. No pancreatic head mass, common bile duct stone or ampullary lesion.     Principal Problem:   Chest and back pain with moderate risk of acute coronary syndrome Active Problems:   Abnormal nuclear cardiac imaging test   Elevated amylase and lipase- MRI unremarkable   Anemia, iron deficiency   Gastritis    PLAN: Proceed with cath today.   Kerin Ransom PA-C Beeper [phone] 08/30/2013, 8:16 AM   The patient was seen, examined and discussed with Kerin Ransom, PA-C and I agree with the above.   63 year old female with profound fatigue, and back pain that are possibly anginal equivalents who I saw in the clinic and schedule for a cath on 7/8 for an abnormal stress test (distal anterior ischemia). She was admitted over the weekend with CP associated with SOB and diaphoresis and was found to have elevated amylase and lipase. GI is following for a questionable pancreatic mass on CT that was negative on MRI and also findings of 2 liver leasions. They concluded this was an exaggerated physiologic response to GB removal. GI will follow on an outpatient basis with follow up MRI  for stability of liver lesions.   Plan: cath today. Continue aspirin, no beta-lockers as bradycardic at baseline, no statins with elevated ALT, amylase, lipase. We will add if evidence of CAD on cath.  Dorothy Spark 08/30/2013

## 2013-08-30 NOTE — Progress Notes (Signed)
Faint bruising proximal to TR band on back side of forearm. Hematoma unchanged. 3cc total thus far removed from TR band. No oozing.

## 2013-08-30 NOTE — Progress Notes (Signed)
Subjective:  No chest or back pain overnight  Scheduled Meds: . [START ON 08/31/2013] aspirin  81 mg Oral Pre-Cath  . aspirin EC  81 mg Oral Daily  . cholecalciferol  1,000 Units Oral Daily  . pantoprazole  40 mg Oral Q0600  . sodium chloride  3 mL Intravenous Q12H  . sodium chloride  3 mL Intravenous Q12H   Continuous Infusions: . [START ON 08/31/2013] sodium chloride    . sodium chloride 50 mL/hr at 08/30/13 0938   PRN Meds:.sodium chloride, sodium chloride, acetaminophen, loratadine, nitroGLYCERIN, ondansetron (ZOFRAN) IV, polyvinyl alcohol, sodium chloride, sodium chloride   Objective:  Vital Signs in the last 24 hours: Temp:  [97.6 F (36.4 C)-98.1 F (36.7 C)] 97.7 F (36.5 C) (07/06 0530) Pulse Rate:  [58-70] 58 (07/06 0530) Resp:  [15-17] 15 (07/06 0530) BP: (113-124)/(47-90) 124/47 mmHg (07/06 0530) SpO2:  [97 %-99 %] 99 % (07/06 0530)  Intake/Output from previous day:  Intake/Output Summary (Last 24 hours) at 08/30/13 0816 Last data filed at 08/29/13 1015  Gross per 24 hour  Intake      3 ml  Output      0 ml  Net      3 ml    Physical Exam: General appearance: alert, cooperative and no distress Lungs: clear to auscultation bilaterally Heart: regular rate and rhythm   Rate: 58  Rhythm: normal sinus rhythm and sinus bradycardia  Lab Results:  Recent Labs  08/27/13 1517 08/28/13 0230  WBC 5.4 4.1  HGB 12.6 11.0*  PLT 190 171    Recent Labs  08/27/13 1517 08/28/13 0230  NA 142 144  K 3.6* 4.2  CL 104 110  CO2 22 25  GLUCOSE 137* 99  BUN 16 15  CREATININE 0.61 0.69    Recent Labs  08/28/13 0230 08/28/13 0715  TROPONINI <0.30 <0.30   No results found for this basename: INR,  in the last 72 hours  Imaging: Imaging results have been reviewed  Cardiac Studies:  Assessment/Plan:  63 y/o female with recent abnormal stress test done to investigate fatigue, possibly limited by breast attenuation - admitted 08/27/13 with chest and back  pain. She had been scheduled for cath later this week. Amylase and lipase elevated and pt seen by GI. MRI done 08/28/12 showed common bile duct dilatation likely due to prior cholecystectomy. No pancreatic head mass, common bile duct stone or ampullary lesion.     Principal Problem:   Chest and back pain with moderate risk of acute coronary syndrome Active Problems:   Abnormal nuclear cardiac imaging test   Elevated amylase and lipase- MRI unremarkable   Anemia, iron deficiency   Gastritis    PLAN: Proceed with cath today.   Kerin Ransom PA-C Beeper [phone] 08/30/2013, 8:16 AM   The patient was seen, examined and discussed with Kerin Ransom, PA-C and I agree with the above.   64 year old female with profound fatigue, and back pain that are possibly anginal equivalents who I saw in the clinic and schedule for a cath on 7/8 for an abnormal stress test (distal anterior ischemia). She was admitted over the weekend with CP associated with SOB and diaphoresis and was found to have elevated amylase and lipase. GI is following for a questionable pancreatic mass on CT that was negative on MRI and also findings of 2 liver leasions. They concluded this was an exaggerated physiologic response to GB removal. GI will follow on an outpatient basis with follow up MRI  for stability of liver lesions.   Plan: cath today. Continue aspirin, no beta-lockers as bradycardic at baseline, no statins with elevated ALT, amylase, lipase. We will add if evidence of CAD on cath.  Dorothy Spark 08/30/2013

## 2013-08-30 NOTE — Progress Notes (Signed)
[facility] Progress Note    Since last GI note: Feels well, NPO after MN for cardiac cath today.  Objective: Vital signs in last 24 hours: Temp:  [97.6 F (36.4 C)-98.1 F (36.7 C)] 97.7 F (36.5 C) (07/06 0530) Pulse Rate:  [58-70] 58 (07/06 0530) Resp:  [15-17] 15 (07/06 0530) BP: (113-124)/(47-90) 124/47 mmHg (07/06 0530) SpO2:  [97 %-99 %] 99 % (07/06 0530) Last BM Date: 08/29/13 General: alert and oriented times 3 Heart: regular rate and rythm Abdomen: soft, non-tender, non-distended, normal bowel sounds   Lab Results:  Recent Labs  08/27/13 1517 08/28/13 0230  WBC 5.4 4.1  HGB 12.6 11.0*  PLT 190 171  MCV 84.9 86.0    Recent Labs  08/27/13 1517 08/28/13 0230  NA 142 144  K 3.6* 4.2  CL 104 110  CO2 22 25  GLUCOSE 137* 99  BUN 16 15  CREATININE 0.61 0.69  CALCIUM 10.6* 10.1    Recent Labs  08/28/13 0715  PROT 5.6*  ALBUMIN 3.1*  AST 25  ALT 20  ALKPHOS 70  BILITOT 0.3  BILIDIR <0.2  IBILI NOT CALCULATED   No results found for this basename: INR,  in the last 72 hours   Studies/Results: Mr 3d Recon At Scanner  08/29/2013   CLINICAL DATA:  Abdominal pain. History of cholecystectomy with common bowel duct dilatation. Small liver lesions seen on recent CT scan.  EXAM: MRI ABDOMEN WITHOUT AND WITH CONTRAST (INCLUDING MRCP)  TECHNIQUE: Multiplanar multisequence MR imaging of the abdomen was performed both before and after the administration of intravenous contrast. Heavily T2-weighted images of the biliary and pancreatic ducts were obtained, and three-dimensional MRCP images were rendered by post processing.  CONTRAST:  73mL MULTIHANCE GADOBENATE DIMEGLUMINE 529 MG/ML IV SOLN  COMPARISON:  CT scan 08/27/2013  FINDINGS: The gallbladder is surgically absent. There is common bile duct dilatation measuring up to 12 mm. No common bile duct stones are identified. The duct tapers to the ampulla. No pancreatic head mass or ampullary mass. The  pancreatic duct is normal in caliber and has a normal course. No intrahepatic biliary dilatation.  There are 2 arterial phase enhancing liver lesions as demonstrated on the CT scan. This is the only sequence on which the lesions are visualized (series 1401). The right hepatic lobe lesion near the dome posteriorly is peripheral and could be a small vascular shunt (THID). There is a bilobed lesion just anterior to the IVC which measures a maximum of 17 mm. This is not visible on any other imaging sequence. This is most likely [place] and less likely adenoma. I do not see any findings for cirrhosis to suggest this is a hepatoma. Fibrolamellar HCC is a possibility.  The pancreas is normal. The spleen is normal. The adrenal glands and kidneys are normal. No mesenteric or retroperitoneal mass or adenopathy. Aorta and branch vessels are normal. The major venous structures are patent.  The stomach, duodenum, small bowel and colon are unremarkable. The bony structures are unremarkable. A hemangioma is noted in the L1 vertebral body  IMPRESSION: 1. Common bile duct dilatation likely due to prior cholecystectomy. Low insertion of a cystic duct remnant is noted. No pancreatic head mass, common bile duct stone or ampullary lesion. 2. Normal pancreas and pancreatic duct. 3. Two arterial phase enhancing liver lesions as discussed above. I would recommend a followup MR examination in 4 months to document stability.   Electronically Signed   By: Veneta Penton.D.  On: 08/29/2013 15:48   Mr Abd W/wo Cm/mrcp  08/29/2013   CLINICAL DATA:  Abdominal pain. History of cholecystectomy with common bowel duct dilatation. Small liver lesions seen on recent CT scan.  EXAM: MRI ABDOMEN WITHOUT AND WITH CONTRAST (INCLUDING MRCP)  TECHNIQUE: Multiplanar multisequence MR imaging of the abdomen was performed both before and after the administration of intravenous contrast. Heavily T2-weighted images of the biliary and pancreatic ducts were  obtained, and three-dimensional MRCP images were rendered by post processing.  CONTRAST:  35mL MULTIHANCE GADOBENATE DIMEGLUMINE 529 MG/ML IV SOLN  COMPARISON:  CT scan 08/27/2013  FINDINGS: The gallbladder is surgically absent. There is common bile duct dilatation measuring up to 12 mm. No common bile duct stones are identified. The duct tapers to the ampulla. No pancreatic head mass or ampullary mass. The pancreatic duct is normal in caliber and has a normal course. No intrahepatic biliary dilatation.  There are 2 arterial phase enhancing liver lesions as demonstrated on the CT scan. This is the only sequence on which the lesions are visualized (series 1401). The right hepatic lobe lesion near the dome posteriorly is peripheral and could be a small vascular shunt (THID). There is a bilobed lesion just anterior to the IVC which measures a maximum of 17 mm. This is not visible on any other imaging sequence. This is most likely [place] and less likely adenoma. I do not see any findings for cirrhosis to suggest this is a hepatoma. Fibrolamellar HCC is a possibility.  The pancreas is normal. The spleen is normal. The adrenal glands and kidneys are normal. No mesenteric or retroperitoneal mass or adenopathy. Aorta and branch vessels are normal. The major venous structures are patent.  The stomach, duodenum, small bowel and colon are unremarkable. The bony structures are unremarkable. A hemangioma is noted in the L1 vertebral body  IMPRESSION: 1. Common bile duct dilatation likely due to prior cholecystectomy. Low insertion of a cystic duct remnant is noted. No pancreatic head mass, common bile duct stone or ampullary lesion. 2. Normal pancreas and pancreatic duct. 3. Two arterial phase enhancing liver lesions as discussed above. I would recommend a followup MR examination in 4 months to document stability.   Electronically Signed   By: Kalman Jewels M.D.   On: 08/29/2013 15:48     Medications: Scheduled Meds: .  [START ON 08/31/2013] aspirin  81 mg Oral Pre-Cath  . aspirin EC  81 mg Oral Daily  . cholecalciferol  1,000 Units Oral Daily  . pantoprazole  40 mg Oral Q0600  . sodium chloride  3 mL Intravenous Q12H  . sodium chloride  3 mL Intravenous Q12H   Continuous Infusions: . [START ON 08/31/2013] sodium chloride    . sodium chloride     PRN Meds:.sodium chloride, sodium chloride, acetaminophen, loratadine, nitroGLYCERIN, ondansetron (ZOFRAN) IV, polyvinyl alcohol, sodium chloride, sodium chloride    Assessment/Plan: 63 y.o. female with chest pains, elevated pancreatic enzymes, dilated CBD  MRI exonerates her pancreas, ampullary region.  CBD is more dilated than is usual s/p cholecystectomy but contains no stones, no masses and given normal LFTs this probably is just an exaggerated physiologic response to GB removal.  Will follow up with her as an outpatient.  My office will contact her to schedule.  Likely will plan to repeat MRI per radiologist recommendation to demonstrate stability of liver lesion(s).     Milus Banister, MD  08/30/2013, 9:12 AM [facility] Pager [phone]

## 2013-08-30 NOTE — Discharge Summary (Signed)
CARDIOLOGY DISCHARGE SUMMARY   Patient ID: Bethany Carter MRN: [id number] DOB/AGE: [birth year] 63 y.o.  Admit date: 08/27/2013 Discharge date: 08/30/2013  PCP: Cathlean Cower, MD Primary Cardiologist: Dr. Meda Coffee  Primary Discharge Diagnosis:   Chest and back pain with moderate risk of acute coronary syndrome Secondary Discharge Diagnosis:    Anemia, iron deficiency   Abnormal nuclear cardiac imaging test   Gastritis   Elevated amylase and lipase- MRI unremarkable  Procedures: Cardiac catheterization, coronary arteriogram, left ventriculogram, MRI of the abdomen with and without contrast, CT angiogram of the chest  Hospital Course: Bethany Carter is a 63 y.o. female with no history of CAD. She was being evaluated for fatigue and had a stress test. The stress test showed mid to apical ischemia. On the day of admission, she developed chest pain and came to the emergency room her symptoms resolved with sublingual nitroglycerin and aspirin. She was admitted for further evaluation and treatment.  Her cardiac enzymes were negative for MI. An abnormal stress test and chest pain, cardiac catheterization was indicated. This was performed on 07/06.  Results are below. She had no significant CAD. Her EF is preserved. Post-cath, she had some hemorrhage at the cath site and a level I hematoma. This was treated with direct pressure and the TR band was deflated very slowly. Because the TR panel was not fully deflated prior to 11 PM, she was held overnight.  Post-cath, she is not having any chest pain or shortness of breath. She is feeling much better and considered stable for discharge, to followup as an outpatient.  Labs:   Lab Results  Component Value Date   WBC 4.1 08/28/2013   HGB 11.0* 08/28/2013   HCT 35.0* 08/28/2013   MCV 86.0 08/28/2013   PLT 171 08/28/2013     Recent Labs Lab 08/28/13 0230 08/28/13 0715  NA 144  --   K 4.2  --   CL 110  --   CO2 25  --   BUN 15  --   CREATININE 0.69  --    CALCIUM 10.1  --   PROT  --  5.6*  BILITOT  --  0.3  ALKPHOS  --  70  ALT  --  20  AST  --  25  GLUCOSE 99  --     Recent Labs  08/28/13 0230 08/28/13 0715  TROPONINI <0.30 <0.30   Lipid Panel     Component Value Date/Time   TRIG 75 08/25/2013 1459   LDLCALC 82 08/25/2013 1459    Radiology:  MR ABD 3d Recon At Scanner 08/29/2013   CLINICAL DATA:  Abdominal pain. History of cholecystectomy with common bowel duct dilatation. Small liver lesions seen on recent CT scan.  EXAM: MRI ABDOMEN WITHOUT AND WITH CONTRAST (INCLUDING MRCP)  TECHNIQUE: Multiplanar multisequence MR imaging of the abdomen was performed both before and after the administration of intravenous contrast. Heavily T2-weighted images of the biliary and pancreatic ducts were obtained, and three-dimensional MRCP images were rendered by post processing.  CONTRAST:  16mL MULTIHANCE GADOBENATE DIMEGLUMINE 529 MG/ML IV SOLN  COMPARISON:  CT scan 08/27/2013  FINDINGS: The gallbladder is surgically absent. There is common bile duct dilatation measuring up to 12 mm. No common bile duct stones are identified. The duct tapers to the ampulla. No pancreatic head mass or ampullary mass. The pancreatic duct is normal in caliber and has a normal course. No intrahepatic biliary dilatation.  There are 2 arterial phase enhancing liver lesions  as demonstrated on the CT scan. This is the only sequence on which the lesions are visualized (series 1401). The right hepatic lobe lesion near the dome posteriorly is peripheral and could be a small vascular shunt (THID). There is a bilobed lesion just anterior to the IVC which measures a maximum of 17 mm. This is not visible on any other imaging sequence. This is most likely [place] and less likely adenoma. I do not see any findings for cirrhosis to suggest this is a hepatoma. Fibrolamellar HCC is a possibility.  The pancreas is normal. The spleen is normal. The adrenal glands and kidneys are normal. No mesenteric or  retroperitoneal mass or adenopathy. Aorta and branch vessels are normal. The major venous structures are patent.  The stomach, duodenum, small bowel and colon are unremarkable. The bony structures are unremarkable. A hemangioma is noted in the L1 vertebral body  IMPRESSION: 1. Common bile duct dilatation likely due to prior cholecystectomy. Low insertion of a cystic duct remnant is noted. No pancreatic head mass, common bile duct stone or ampullary lesion. 2. Normal pancreas and pancreatic duct. 3. Two arterial phase enhancing liver lesions as discussed above. I would recommend a followup MR examination in 4 months to document stability.   Electronically Signed   By: Kalman Jewels M.D.   On: 08/29/2013 15:48   Dg Chest Port 1 View 08/27/2013   CLINICAL DATA:  Chest pain.  Dizziness.  EXAM: PORTABLE CHEST - 1 VIEW  COMPARISON:  07/26/2013  FINDINGS: Heart size is normal. Mediastinal shadows are normal. The lungs are clear. Artifact overlies the chest. The vascularity is normal. No effusions.  IMPRESSION: No active disease.   Electronically Signed   By: Nelson Chimes M.D.   On: 08/27/2013 16:44   Ct Angio Chest Aortic Dissect W &/or W/o 08/27/2013   CLINICAL DATA:  Chest pain.  Hyperventilated.  EXAM: CT ANGIOGRAPHY CHEST, ABDOMEN AND PELVIS  TECHNIQUE: Multidetector CT imaging through the chest, abdomen and pelvis was performed using the standard protocol during bolus administration of intravenous contrast. Multiplanar reconstructed images and MIPs were obtained and reviewed to evaluate the vascular anatomy.  CONTRAST:  170mL OMNIPAQUE IOHEXOL 350 MG/ML SOLN  COMPARISON:  None.  FINDINGS: CTA CHEST FINDINGS  No filling defects in the pulmonary arteries to suggest pulmonary emboli. Heart is normal size. Aorta is normal caliber. No dissection. Areas of biapical and bibasilar scarring. No confluent airspace opacities. No effusions.  No mediastinal, hilar, or axillary adenopathy. Chest wall soft tissues are  unremarkable. Bilateral breast implants noted.  No acute bony abnormality or focal bone lesion.  Review of the MIP images confirms the above findings.  CTA ABDOMEN AND PELVIS FINDINGS  No evidence of aortic aneurysm or dissection. The iliac and femoral vessels are normal caliber. Mesenteric vessels and renal arteries are widely patent.  Prior cholecystectomy. Common bile duct is dilated, measuring up to 12 mm. Mild prominence of the pancreatic duct. No focal hepatic lesion. Spleen, pancreas, adrenals and kidneys are unremarkable. There is mild soft tissue fullness in the region of the ampulla. Difficult to completely exclude ampullary lesion.  Hypervascular area noted centrally within the liver measuring 14 mm. Small hypervascular area noted posteriorly in the right hepatic lobe. No other focal hepatic lesions.  Scattered sigmoid diverticulosis. No active diverticulitis. Small bowel is decompressed. No free fluid, free air or adenopathy.  Uterus, adnexae and urinary bladder are unremarkable.  No acute bony abnormality or focal bone lesion.  Review of the MIP  images confirms the above findings.  IMPRESSION: No evidence of aortic aneurysm or dissection. No evidence of pulmonary embolus.  Small hypervascular areas within the liver, nonspecific. 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  Prior cholecystectomy. Mild common bile duct and pancreatic ductal dilatation the soft tissue fullness in the region of the ampulla. Recommend correlation with LFTs. This could be further evaluated with MRI of the abdomen with and without contrast.   Electronically Signed   By: Rolm Baptise M.D.   On: 08/27/2013 19:12   Cardiac Cath: 08/30/2013 Left main: No obstructive disease.  Left Anterior Descending Artery: Large caliber vessel that courses to the apex. Moderate caliber diagonal branch. No obstructive disease.  Circumflex Artery: Moderate caliber vessel with small obtuse marginal branch. No obstructive disease.    Right Coronary Artery: Large dominant vessel with no obstructive disease.  Left Ventricular Angiogram: LVEF=55-60%.  Impression:  1. No angiographic evidence of CAD  2. Normal LV systolic function  3. Non-cardiac chest pain  Recommendations: No further ischemic workup.   EKG: 29-Aug-2013 10:08:43 Normal sinus rhythm T wave inversion in V2 may be normal variant Normal ECG Since last tracing T wave in V2 is now inverted Vent. rate 66 BPM PR interval 160 ms QRS duration 82 ms QT/QTc 386/404 ms P-R-T axes 45 28 32  FOLLOW UP PLANS AND APPOINTMENTS Allergies  Allergen Reactions  . Codeine Shortness Of Breath and Other (See Comments)    Severe abdominal pain  . Percocet [Oxycodone-Acetaminophen] Shortness Of Breath    Intense abd pain     Medication List         acetaminophen 500 MG tablet  Commonly known as:  TYLENOL  Take 500 mg by mouth every 8 (eight) hours as needed for moderate pain.     aspirin 81 MG EC tablet  Take 1 tablet (81 mg total) by mouth daily. Swallow whole.     cetirizine 10 MG tablet  Commonly known as:  ZYRTEC  Take 10 mg by mouth daily as needed for allergies.     cholecalciferol 1000 UNITS tablet  Commonly known as:  VITAMIN D  Take 1,000 Units by mouth daily.     hydrochlorothiazide 25 MG tablet  Commonly known as:  HYDRODIURIL  Take 25 mg by mouth daily as needed (excess fluid).     hydroxypropyl methylcellulose 2.5 % ophthalmic solution  Commonly known as:  ISOPTO TEARS  Place 1 drop into both eyes daily as needed for dry eyes.     meloxicam 15 MG tablet  Commonly known as:  MOBIC  Take 15 mg by mouth daily as needed for pain.        Discharge Instructions   Diet - low sodium heart healthy    Complete by:  As directed      Increase activity slowly    Complete by:  As directed           Follow-up Information   Follow up with Milus Banister, MD. (office will call you)    Specialty:  Gastroenterology   Contact information:    [address] [place] [address] [phone]       Follow up with Dorothy Spark, MD.   Specialty:  Cardiology   Contact information:   [address] [place] [address] [phone]       BRING ALL MEDICATIONS WITH YOU TO FOLLOW UP APPOINTMENTS  Time spent with patient to include physician time: 33 min  Signed: Rosaria Ferries, PA-C 08/30/2013, 9:45 PM Co-Sign MD

## 2013-08-30 NOTE — Interval H&P Note (Signed)
History and Physical Interval Note:  08/30/2013 4:27 PM  Bethany Carter  has presented today for cardiac cath with the diagnosis of chest pain, abnormal stress myoview.  The various methods of treatment have been discussed with the patient and family. After consideration of risks, benefits and other options for treatment, the patient has consented to  Procedure(s): LEFT HEART CATHETERIZATION WITH CORONARY ANGIOGRAM (N/A) as a surgical intervention .  The patient's history has been reviewed, patient examined, no change in status, stable for surgery.  I have reviewed the patient's chart and labs.  Questions were answered to the patient's satisfaction.    Cath Lab Visit (complete for each Cath Lab visit)  Clinical Evaluation Leading to the Procedure:   ACS: No.  Non-ACS:    Anginal Classification: CCS II  Anti-ischemic medical therapy: No Therapy  Non-Invasive Test Results: Low-risk stress test findings: cardiac mortality <1%/year  Prior CABG: No previous CABG        MCALHANY,CHRISTOPHER

## 2013-08-30 NOTE — CV Procedure (Signed)
      Cardiac Catheterization Operative Report  Bethany Carter [id number] 7/6/20154:37 PM Cathlean Cower, MD  Procedure Performed:  1. Left Heart Catheterization 2. Selective Coronary Angiography 3. Left ventricular angiogram  Operator: Lauree Chandler, MD  Arterial access site:  Right radial artery.   Indication: 63 yo female with recent chest pain concerning for unstable angina. Intermediate risk stress myoview with possible mid anterior wall and apical ischemia.                                    Procedure Details: The risks, benefits, complications, treatment options, and expected outcomes were discussed with the patient. The patient and/or family concurred with the proposed plan, giving informed consent. The patient was brought to the cath lab after IV hydration was begun and oral premedication was given. The patient was further sedated with Versed and Fentanyl. The right wrist was assessed with an Allens test which was positive. The right wrist was prepped and draped in a sterile fashion. 1% lidocaine was used for local anesthesia. Using the modified Seldinger access technique, a 5 French sheath was placed in the right radial artery. 3 mg Verapamil was given through the sheath. 4000 units IV heparin was given. Standard diagnostic catheters were used to perform selective coronary angiography. A pigtail catheter was used to perform a left ventricular angiogram. The sheath was removed from the right radial artery and a Terumo hemostasis band was applied at the arteriotomy site on the right wrist.   There were no immediate complications. The patient was taken to the recovery area in stable condition.   Hemodynamic Findings: Central aortic pressure: 132/65 Left ventricular pressure: 130/3/15  Angiographic Findings:  Left main: No obstructive disease.   Left Anterior Descending Artery: Large caliber vessel that courses to the apex. Moderate caliber diagonal branch. No obstructive  disease.   Circumflex Artery: Moderate caliber vessel with small obtuse marginal branch. No obstructive disease.   Right Coronary Artery: Large dominant vessel with no obstructive disease.   Left Ventricular Angiogram: LVEF=55-60%.   Impression: 1. No angiographic evidence of CAD 2. Normal LV systolic function 3. Non-cardiac chest pain  Recommendations: No further ischemic workup.        Complications:  None. The patient tolerated the procedure well.

## 2013-08-30 NOTE — Progress Notes (Signed)
Patient TR bands slow deflate because of the oozing. TR band deflation is not expected to be completed until after 11 PM. Because of this, she will be held overnight. Discharge in a.m. if does well.

## 2013-08-30 NOTE — Progress Notes (Signed)
Instructions explained to patient; verbalized understanding.

## 2013-08-31 ENCOUNTER — Telehealth: Payer: Self-pay

## 2013-08-31 DIAGNOSIS — I209 Angina pectoris, unspecified: Secondary | ICD-10-CM

## 2013-08-31 MED ORDER — NITROGLYCERIN 0.4 MG SL SUBL: 0.4000 mg | SUBLINGUAL_TABLET | SUBLINGUAL | Status: DC | PRN

## 2013-08-31 NOTE — Telephone Encounter (Signed)
Message copied by Barron Alvine on Tue Aug 31, 2013  9:37 AM ------      Message from: Owens Loffler P      Created: Mon Aug 30, 2013  9:15 AM       Should be d/c'd in next few days.  She will need rov with me in 2 months for abnormal liver.              Thanks       ------

## 2013-08-31 NOTE — Progress Notes (Signed)
Pt kept overnight because of late cath. Her wrist has some ecchymosis, no hematoma. She has requested an Rx for SL NTG for "spasm"- this was suggested by a family member who apparently is a Hydrographic surveyor. I explained there was no documented coronary spasm, Troponin elevation, or EKG changes noted. She has been instructed to take SL NTG only for severe SSCP or pressure.   Kerin Ransom PA-C 08/31/2013 11:32 AM  Ecchymosis without active bleeding or hematoma of right wrist. OK for discharge. Empirical SL NTG prn for possible coronary vasospasm. Maintenance daily therapy for possible vasospasm does not appear justified. Address risk factors. Consider GI evaluation as outpatient.  Sanda Klein, MD, [facility] [phone] office [phone] pager

## 2013-08-31 NOTE — Telephone Encounter (Signed)
Letter mailed with appt date and time

## 2013-08-31 NOTE — Discharge Instructions (Signed)
Radial Site Care Refer to this sheet in the next few weeks. These instructions provide you with information on caring for yourself after your procedure. Your caregiver may also give you more specific instructions. Your treatment has been planned according to current medical practices, but problems sometimes occur. Call your caregiver if you have any problems or questions after your procedure. HOME CARE INSTRUCTIONS  You may shower the day after the procedure.Remove the bandage (dressing) and gently wash the site with plain soap and water.Gently pat the site dry.  Do not apply powder or lotion to the site.  Do not submerge the affected site in water for 3 to 5 days.  Inspect the site at least twice daily.  Do not flex or bend the affected arm for 24 hours.  No lifting over 5 pounds (2.3 kg) for 5 days after your procedure.  Do not drive home if you are discharged the same day of the procedure. Have someone else drive you.  You may drive 24 hours after the procedure unless otherwise instructed by your caregiver.  Do not operate machinery or power tools for 24 hours.  A responsible adult should be with you for the first 24 hours after you arrive home. What to expect:  Any bruising will usually fade within 1 to 2 weeks.  Blood that collects in the tissue (hematoma) may be painful to the touch. It should usually decrease in size and tenderness within 1 to 2 weeks. SEEK IMMEDIATE MEDICAL CARE IF:  You have unusual pain at the radial site.  You have redness, warmth, swelling, or pain at the radial site.  You have drainage (other than a small amount of blood on the dressing).  You have chills.  You have a fever or persistent symptoms for more than 72 hours.  You have a fever and your symptoms suddenly get worse.  Your arm becomes pale, cool, tingly, or numb.  You have heavy bleeding from the site. Hold pressure on the site. Document Released: 03/16/2010 Document Revised:  05/06/2011 Document Reviewed: 03/16/2010 [facility] Patient Information 2015 [place], [place]. This information is not intended to replace advice given to you by your health care provider. Make sure you discuss any questions you have with your health care provider.    Chest Pain Observation It is often hard to give a specific diagnosis for the cause of chest pain. Among other possibilities your symptoms might be caused by inadequate oxygen delivery to your heart (angina). Angina that is not treated or evaluated can lead to a heart attack (myocardial infarction) or death. Blood tests, electrocardiograms, and X-rays may have been done to help determine a possible cause of your chest pain. After evaluation and observation, your health care provider has determined that it is unlikely your pain was caused by an unstable condition that requires hospitalization. However, a full evaluation of your pain may need to be completed, with additional diagnostic testing as directed. It is very important to keep your follow-up appointments. Not keeping your follow-up appointments could result in permanent heart damage, disability, or death. If there is any problem keeping your follow-up appointments, you must call your health care provider. HOME CARE INSTRUCTIONS  Due to the slight chance that your pain could be angina, it is important to follow your health care provider's treatment plan and also maintain a healthy lifestyle:  Maintain or work toward achieving a healthy weight.  Stay physically active and exercise regularly.  Decrease your salt intake.  Eat a balanced, healthy  diet. Talk to a dietitian to learn about heart-healthy foods.  Increase your fiber intake by including whole grains, vegetables, fruits, and nuts in your diet.  Avoid situations that cause stress, anger, or depression.  Take medicines as advised by your health care provider. Report any side effects to your health care provider. Do not stop  medicines or adjust the dosages on your own.  Quit smoking. Do not use nicotine patches or gum until you check with your health care provider.  Keep your blood pressure, blood sugar, and cholesterol levels within normal limits.  Limit alcohol intake to no more than 1 drink per day for women who are not pregnant and 2 drinks per day for men.  Do not abuse drugs. SEEK IMMEDIATE MEDICAL CARE IF: You have severe chest pain or pressure which may include symptoms such as:  You feel pain or pressure in your arms, neck, jaw, or back.  You have severe back or abdominal pain, feel sick to your stomach (nauseous), or throw up (vomit).  You are sweating profusely.  You are having a fast or irregular heartbeat.  You feel short of breath while at rest.  You notice increasing shortness of breath during rest, sleep, or with activity.  You have chest pain that does not get better after rest or after taking your usual medicine.  You wake from sleep with chest pain.  You are unable to sleep because you cannot breathe.  You develop a frequent cough or you are coughing up blood.  You feel dizzy, faint, or experience extreme fatigue.  You develop severe weakness, dizziness, fainting, or chills. Any of these symptoms may represent a serious problem that is an emergency. Do not wait to see if the symptoms will go away. Call your local emergency services (911 in the [place] Do not drive yourself to the hospital. MAKE SURE YOU:  Understand these instructions.  Will watch your condition.  Will get help right away if you are not doing well or get worse. Document Released: 03/16/2010 Document Revised: 02/16/2013 Document Reviewed: 08/13/2012 [facility] Patient Information 2015 [place], [place]. This information is not intended to replace advice given to you by your health care provider. Make sure you discuss any questions you have with your health care provider.

## 2013-08-31 NOTE — Progress Notes (Signed)
Reviewed discharge instructions with patient and husband, they stated their understanding.  Right radial level 0, no hematoma or bruising.  Radial site care instructions reviewed.  Discharged home via wheelchair by volunteers with husband.  Bethany Carter

## 2013-09-01 ENCOUNTER — Ambulatory Visit (HOSPITAL_COMMUNITY)
Admission: RE | Admit: 2013-09-01 | Payer: BC Managed Care – PPO | Source: Ambulatory Visit | Admitting: Cardiovascular Disease

## 2013-09-01 ENCOUNTER — Encounter (HOSPITAL_COMMUNITY): Admission: RE | Payer: Self-pay | Source: Ambulatory Visit

## 2013-09-01 SURGERY — LEFT HEART CATHETERIZATION WITH CORONARY ANGIOGRAM
Anesthesia: LOCAL | Laterality: Left

## 2013-10-25 ENCOUNTER — Encounter: Payer: Self-pay | Admitting: *Deleted

## 2013-10-26 ENCOUNTER — Ambulatory Visit (INDEPENDENT_AMBULATORY_CARE_PROVIDER_SITE_OTHER): Payer: BC Managed Care – PPO | Admitting: Cardiology

## 2013-10-26 ENCOUNTER — Encounter: Payer: Self-pay | Admitting: Cardiology

## 2013-10-26 VITALS — BP 118/56 | HR 78 | Ht 63.0 in | Wt 133.8 lb

## 2013-10-26 DIAGNOSIS — T733XXS Exhaustion due to excessive exertion, sequela: Secondary | ICD-10-CM

## 2013-10-26 DIAGNOSIS — T733XXA Exhaustion due to excessive exertion, initial encounter: Secondary | ICD-10-CM

## 2013-10-26 DIAGNOSIS — R002 Palpitations: Secondary | ICD-10-CM

## 2013-10-26 DIAGNOSIS — IMO0002 Reserved for concepts with insufficient information to code with codable children: Secondary | ICD-10-CM

## 2013-10-26 NOTE — Patient Instructions (Signed)
Your physician recommends that you continue on your current medications as directed. Please refer to the Current Medication list given to you today.  Your physician wants you to follow-up in: one year with Dr Johann Capers will receive a reminder letter in the mail two months in advance. If you don't receive a letter, please call our office to schedule the follow-up appointment.
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
## 2013-11-10 ENCOUNTER — Encounter: Payer: Self-pay | Admitting: Gastroenterology

## 2013-11-10 ENCOUNTER — Ambulatory Visit (INDEPENDENT_AMBULATORY_CARE_PROVIDER_SITE_OTHER): Payer: BC Managed Care – PPO | Admitting: Gastroenterology

## 2013-11-10 VITALS — BP 132/74 | HR 60 | Ht 62.0 in | Wt 128.2 lb

## 2013-11-10 DIAGNOSIS — R933 Abnormal findings on diagnostic imaging of other parts of digestive tract: Secondary | ICD-10-CM

## 2013-11-10 NOTE — Patient Instructions (Signed)
MRI of the liver (with and without contrast) in Mid November to followup previously noted liver lesions.
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
## 2013-12-29 ENCOUNTER — Telehealth: Payer: Self-pay | Admitting: Gastroenterology

## 2013-12-30 NOTE — Telephone Encounter (Signed)
Pt aware that a staff message was sent to myself the middle of November to set that up.

## 2014-01-06 ENCOUNTER — Telehealth: Payer: Self-pay | Admitting: Gastroenterology

## 2014-01-06 DIAGNOSIS — K769 Liver disease, unspecified: Secondary | ICD-10-CM

## 2014-01-06 DIAGNOSIS — R109 Unspecified abdominal pain: Secondary | ICD-10-CM

## 2014-01-10 NOTE — Telephone Encounter (Signed)
01/21/14 4 pm arrive 345 pm NPO 6 hours.  [facility] pt aware and instructed

## 2014-01-21 ENCOUNTER — Other Ambulatory Visit: Payer: Self-pay | Admitting: Gastroenterology

## 2014-01-21 ENCOUNTER — Ambulatory Visit (HOSPITAL_COMMUNITY)
Admission: RE | Admit: 2014-01-21 | Discharge: 2014-01-21 | Disposition: A | Discharge status: Home / Self Care | Payer: BC Managed Care – PPO | Source: Ambulatory Visit | Attending: Gastroenterology | Admitting: Gastroenterology

## 2014-01-21 DIAGNOSIS — R109 Unspecified abdominal pain: Secondary | ICD-10-CM

## 2014-01-21 DIAGNOSIS — Z09 Encounter for follow-up examination after completed treatment for conditions other than malignant neoplasm: Secondary | ICD-10-CM | POA: Diagnosis not present

## 2014-01-21 DIAGNOSIS — K769 Liver disease, unspecified: Secondary | ICD-10-CM | POA: Diagnosis present

## 2014-01-21 LAB — POCT I-STAT CREATININE: CREATININE: 0.7 mg/dL (ref 0.50–1.10)

## 2014-01-21 MED ORDER — GADOBENATE DIMEGLUMINE 529 MG/ML IV SOLN
11.0000 mL | Freq: Once | INTRAVENOUS | Status: AC | PRN
  Administered 2014-01-21: 11 mL via INTRAVENOUS

## 2014-01-25 ENCOUNTER — Encounter (HOSPITAL_COMMUNITY): Payer: Self-pay | Admitting: Emergency Medicine

## 2014-01-25 ENCOUNTER — Emergency Department (HOSPITAL_COMMUNITY)
Admission: EM | Admit: 2014-01-25 | Discharge: 2014-01-25 | Disposition: A | Discharge status: Home / Self Care | Payer: 59 | Source: Home / Self Care | Attending: Family Medicine | Admitting: Family Medicine

## 2014-01-25 DIAGNOSIS — J069 Acute upper respiratory infection, unspecified: Secondary | ICD-10-CM | POA: Diagnosis not present

## 2014-01-25 LAB — POCT RAPID STREP A: Streptococcus, Group A Screen (Direct): NEGATIVE

## 2014-01-25 MED ORDER — IPRATROPIUM BROMIDE 0.06 % NA SOLN: 2.0000 | Freq: Four times a day (QID) | NASAL | Status: DC

## 2014-01-25 MED ORDER — DEXTROMETHORPHAN POLISTIREX 30 MG/5ML PO LQCR: 60.0000 mg | Freq: Two times a day (BID) | ORAL | Status: DC

## 2014-01-25 NOTE — ED Notes (Signed)
Symptoms started Friday night with sore throat, cough, fever (100.5), nasal congestion and yellow phlegm. She has been using Sudafed and Theraflu with little relief.
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
## 2014-01-26 ENCOUNTER — Ambulatory Visit: Payer: BC Managed Care – PPO | Admitting: Internal Medicine

## 2014-01-27 LAB — CULTURE, GROUP A STREP

## 2014-01-28 ENCOUNTER — Ambulatory Visit (INDEPENDENT_AMBULATORY_CARE_PROVIDER_SITE_OTHER): Payer: BC Managed Care – PPO | Admitting: Family

## 2014-01-28 ENCOUNTER — Encounter: Payer: Self-pay | Admitting: Family

## 2014-01-28 VITALS — BP 120/80 | HR 91 | Temp 98.2°F | Resp 18 | Ht 63.0 in | Wt 127.4 lb

## 2014-01-28 DIAGNOSIS — J019 Acute sinusitis, unspecified: Secondary | ICD-10-CM

## 2014-01-28 MED ORDER — PROMETHAZINE-DM 6.25-15 MG/5ML PO SYRP: 5.0000 mL | ORAL_SOLUTION | Freq: Four times a day (QID) | ORAL | Status: DC | PRN

## 2014-01-28 MED ORDER — BENZONATATE 100 MG PO CAPS: 100.0000 mg | ORAL_CAPSULE | Freq: Two times a day (BID) | ORAL | Status: DC | PRN

## 2014-01-28 MED ORDER — AMOXICILLIN-POT CLAVULANATE 875-125 MG PO TABS: 1.0000 | ORAL_TABLET | Freq: Two times a day (BID) | ORAL | Status: DC

## 2014-01-28 NOTE — Progress Notes (Signed)
Pre visit review using our clinic review tool, if applicable. No additional management support is needed unless otherwise documented below in the visit note. 

## 2014-01-28 NOTE — Patient Instructions (Addendum)
Thank you for choosing [facility].  Summary/Instructions:  Your prescription(s) have been submitted to your pharmacy. Please take as directed and contact our office if you believe you are having problem(s) with the medication(s).  If your symptoms worsen or fail to improve, please contact our office for further instruction, or in case of emergency go directly to the emergency room at the closest medical facility.   General Recommendations: Please drink plenty of fluids. Sleep in humidified air if possible. Use saline nasal sprays or the Netti pot as needed.   Medicaitons:  Flonase as needed to assist with congestion. You may use Mucinex to help break up congestion as you feel is needed. Tylenol as needed for fever. Sudafed for congestion.     Sinusitis Sinusitis is redness, soreness, and inflammation of the paranasal sinuses. Paranasal sinuses are air pockets within the bones of your face (beneath the eyes, the middle of the forehead, or above the eyes). In healthy paranasal sinuses, mucus is able to drain out, and air is able to circulate through them by way of your nose. However, when your paranasal sinuses are inflamed, mucus and air can become trapped. This can allow bacteria and other germs to grow and cause infection. Sinusitis can develop quickly and last only a short time (acute) or continue over a long period (chronic). Sinusitis that lasts for more than 12 weeks is considered chronic.  CAUSES  Causes of sinusitis include:  Allergies.  Structural abnormalities, such as displacement of the cartilage that separates your nostrils (deviated septum), which can decrease the air flow through your nose and sinuses and affect sinus drainage.  Functional abnormalities, such as when the small hairs (cilia) that line your sinuses and help remove mucus do not work properly or are not present. SIGNS AND SYMPTOMS  Symptoms of acute and chronic sinusitis are the same. The primary symptoms  are pain and pressure around the affected sinuses. Other symptoms include:  Upper toothache.  Earache.  Headache.  Bad breath.  Decreased sense of smell and taste.  A cough, which worsens when you are lying flat.  Fatigue.  Fever.  Thick drainage from your nose, which often is green and may contain pus (purulent).  Swelling and warmth over the affected sinuses. DIAGNOSIS  Your health care provider will perform a physical exam. During the exam, your health care provider may:  Look in your nose for signs of abnormal growths in your nostrils (nasal polyps).  Tap over the affected sinus to check for signs of infection.  View the inside of your sinuses (endoscopy) using an imaging device that has a light attached (endoscope). If your health care provider suspects that you have chronic sinusitis, one or more of the following tests may be recommended:  Allergy tests.  Nasal culture. A sample of mucus is taken from your nose, sent to a lab, and screened for bacteria.  Nasal cytology. A sample of mucus is taken from your nose and examined by your health care provider to determine if your sinusitis is related to an allergy. TREATMENT  Most cases of acute sinusitis are related to a viral infection and will resolve on their own within 10 days. Sometimes medicines are prescribed to help relieve symptoms (pain medicine, decongestants, nasal steroid sprays, or saline sprays).  However, for sinusitis related to a bacterial infection, your health care provider will prescribe antibiotic medicines. These are medicines that will help kill the bacteria causing the infection.  Rarely, sinusitis is caused by a fungal   infection. In theses cases, your health care provider will prescribe antifungal medicine. For some cases of chronic sinusitis, surgery is needed. Generally, these are cases in which sinusitis recurs more than 3 times per year, despite other treatments. HOME CARE INSTRUCTIONS   Drink  plenty of water. Water helps thin the mucus so your sinuses can drain more easily.  Use a humidifier.  Inhale steam 3 to 4 times a day (for example, sit in the bathroom with the shower running).  Apply a warm, moist washcloth to your face 3 to 4 times a day, or as directed by your health care provider.  Use saline nasal sprays to help moisten and clean your sinuses.  Take medicines only as directed by your health care provider.  If you were prescribed either an antibiotic or antifungal medicine, finish it all even if you start to feel better. SEEK IMMEDIATE MEDICAL CARE IF:  You have increasing pain or severe headaches.  You have nausea, vomiting, or drowsiness.  You have swelling around your face.  You have vision problems.  You have a stiff neck.  You have difficulty breathing. MAKE SURE YOU:   Understand these instructions.  Will watch your condition.  Will get help right away if you are not doing well or get worse. Document Released: 02/11/2005 Document Revised: 06/28/2013 Document Reviewed: 02/26/2011 [employer] Patient Information 2015 [employer]. This information is not intended to replace advice given to you by your health care provider. Make sure you discuss any questions you have with your health care provider.  

## 2014-01-28 NOTE — Progress Notes (Signed)
Subjective:    Patient ID: Bethany Carter, female    DOB: 08/09/1950, 63 y.o.   MRN: [id number]  Chief Complaint  Patient presents with  . Cough    sore throat, fever, sneezing, headache, sinus pressure, ear pain x1 week     HPI:  Bethany Carter is a 63 y.o. female who presents today for an acute visit.   Acute symptoms of sore throat, fever, sneezing, headache, sinus pressure, and ear pain have been going on for approximately one week. Ear pain just started a couple of days ago. Has had fevers, last being last night about 100. Was seen at Urgent Care on 12/2 and was given Delsym, Mucinex and a nasal spray, none of which has helped. Over the course of the week she feels she has gotten worst.   Allergies  Allergen Reactions  . Codeine Shortness Of Breath and Other (See Comments)    Severe abdominal pain  . Percocet [Oxycodone-Acetaminophen] Shortness Of Breath    Intense abd pain   Current Outpatient Prescriptions on File Prior to Visit  Medication Sig Dispense Refill  . acetaminophen (TYLENOL) 500 MG tablet Take 500 mg by mouth every 8 (eight) hours as needed for moderate pain.    Marland Kitchen aspirin 81 MG EC tablet Take 1 tablet (81 mg total) by mouth daily. Swallow whole. 30 tablet 12  . cetirizine (ZYRTEC) 10 MG tablet Take 10 mg by mouth daily as needed for allergies.    . cholecalciferol (VITAMIN D) 1000 UNITS tablet Take 1,000 Units by mouth daily.    . hydrochlorothiazide (HYDRODIURIL) 25 MG tablet Take 25 mg by mouth daily as needed (excess fluid).    . hydroxypropyl methylcellulose (ISOPTO TEARS) 2.5 % ophthalmic solution Place 1 drop into both eyes daily as needed for dry eyes.    Marland Kitchen ipratropium (ATROVENT) 0.06 % nasal spray Place 2 sprays into both nostrils 4 (four) times daily. 15 mL 1  . meloxicam (MOBIC) 15 MG tablet Take 15 mg by mouth as needed for pain.    . nitroGLYCERIN (NITROSTAT) 0.4 MG SL tablet Place 1 tablet (0.4 mg total) under the tongue every 5 (five) minutes as  needed for chest pain. 25 tablet 2  . dextromethorphan (DELSYM) 30 MG/5ML liquid Take 10 mLs (60 mg total) by mouth 2 (two) times daily. (Patient not taking: Reported on 01/28/2014) 89 mL 1  . phenylephrine (SUDAFED PE) 10 MG TABS tablet Take 10 mg by mouth every 4 (four) hours as needed.    Marland Kitchen Phenylephrine-Pheniramine-DM (THERAFLU COLD & COUGH) 12-15-18 MG PACK Take by mouth.     No current facility-administered medications on file prior to visit.    Review of Systems    See HPI  Objective:    BP 120/80 mmHg  Pulse 91  Temp(Src) 98.2 F (36.8 C) (Oral)  Resp 18  Ht 5\' 3"  (1.6 m)  Wt 127 lb 6.4 oz (57.788 kg)  BMI 22.57 kg/m2  SpO2 96% Nursing note and vital signs reviewed.  Physical Exam  Constitutional: She is oriented to person, place, and time. She appears well-developed and well-nourished. No distress.  HENT:  Right Ear: Hearing, tympanic membrane, external ear and ear canal normal.  Left Ear: Hearing, tympanic membrane, external ear and ear canal normal.  Nose: Right sinus exhibits maxillary sinus tenderness. Right sinus exhibits no frontal sinus tenderness. Left sinus exhibits maxillary sinus tenderness. Left sinus exhibits no frontal sinus tenderness.  Mouth/Throat: Uvula is midline, oropharynx is clear and moist and  mucous membranes are normal.  Ethmoid sinus tenderness  Cardiovascular: Normal rate, regular rhythm, normal heart sounds and intact distal pulses.   Pulmonary/Chest: Effort normal and breath sounds normal.  Lymphadenopathy:    She has no cervical adenopathy.  Neurological: She is alert and oriented to person, place, and time.  Skin: Skin is warm and dry.  Psychiatric: She has a normal mood and affect. Her behavior is normal. Judgment and thought content normal.       Assessment & Plan:

## 2014-01-28 NOTE — Assessment & Plan Note (Addendum)
Symptoms and exam consistent with bacterial sinusitis. Start Augmentin. Start Tessalon and Phenergan DM as needed for cough.continue over-the-counter medications as needed for symptom relief. Sinus care discussed with patient. Follow up if symptoms worsen or fail to improve.

## 2014-02-03 ENCOUNTER — Encounter (HOSPITAL_COMMUNITY): Payer: Self-pay | Admitting: Cardiovascular Disease

## 2014-02-04 ENCOUNTER — Ambulatory Visit: Payer: BC Managed Care – PPO

## 2014-02-04 ENCOUNTER — Ambulatory Visit (INDEPENDENT_AMBULATORY_CARE_PROVIDER_SITE_OTHER): Payer: BC Managed Care – PPO | Admitting: *Deleted

## 2014-02-04 DIAGNOSIS — Z23 Encounter for immunization: Secondary | ICD-10-CM

## 2014-02-08 ENCOUNTER — Other Ambulatory Visit: Payer: Self-pay

## 2014-02-08 DIAGNOSIS — Z1231 Encounter for screening mammogram for malignant neoplasm of breast: Secondary | ICD-10-CM

## 2014-02-15 ENCOUNTER — Encounter: Payer: Self-pay | Admitting: Internal Medicine

## 2014-02-15 ENCOUNTER — Ambulatory Visit (INDEPENDENT_AMBULATORY_CARE_PROVIDER_SITE_OTHER): Payer: 59 | Admitting: Internal Medicine

## 2014-02-15 VITALS — BP 110/70 | HR 73 | Temp 98.0°F | Ht 63.0 in | Wt 130.1 lb

## 2014-02-15 DIAGNOSIS — T7840XD Allergy, unspecified, subsequent encounter: Secondary | ICD-10-CM | POA: Diagnosis not present

## 2014-02-15 DIAGNOSIS — H109 Unspecified conjunctivitis: Secondary | ICD-10-CM | POA: Diagnosis not present

## 2014-02-15 DIAGNOSIS — J069 Acute upper respiratory infection, unspecified: Secondary | ICD-10-CM

## 2014-02-15 MED ORDER — LEVOFLOXACIN 250 MG PO TABS: 250.0000 mg | ORAL_TABLET | Freq: Every day | ORAL | Status: DC

## 2014-02-15 MED ORDER — POLYMYXIN B-TRIMETHOPRIM 10000-0.1 UNIT/ML-% OP SOLN: 1.0000 [drp] | Freq: Four times a day (QID) | OPHTHALMIC | Status: DC

## 2014-02-15 MED ORDER — HYDROCODONE-HOMATROPINE 5-1.5 MG/5ML PO SYRP: 5.0000 mL | ORAL_SOLUTION | Freq: Four times a day (QID) | ORAL | Status: DC | PRN

## 2014-02-15 NOTE — Patient Instructions (Signed)
Please take all new medication as prescribed - the antibiotic (pills and drops for the eye), and cough medicine  Please continue all other medications as before, and refills have been done if requested.  Please have the pharmacy call with any other refills you may need.  Please keep your appointments with your specialists as you may have planned
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
## 2014-02-15 NOTE — Progress Notes (Signed)
Pre visit review using our clinic review tool, if applicable. No additional management support is needed unless otherwise documented below in the visit note. 

## 2014-02-16 NOTE — Assessment & Plan Note (Signed)
stable overall by history and exam, and pt to continue medical treatment as before,  to f/u any worsening symptoms or concerns 

## 2014-02-16 NOTE — Assessment & Plan Note (Signed)
Mild to mod, for antibx course,  to f/u any worsening symptoms or concerns 

## 2014-02-23 ENCOUNTER — Ambulatory Visit
Admission: RE | Admit: 2014-02-23 | Discharge: 2014-02-23 | Disposition: A | Discharge status: Home / Self Care | Payer: BC Managed Care – PPO | Source: Ambulatory Visit

## 2014-02-23 DIAGNOSIS — Z1231 Encounter for screening mammogram for malignant neoplasm of breast: Secondary | ICD-10-CM

## 2014-05-28 DIAGNOSIS — Z8719 Personal history of other diseases of the digestive system: Secondary | ICD-10-CM | POA: Insufficient documentation

## 2014-05-28 DIAGNOSIS — Z8619 Personal history of other infectious and parasitic diseases: Secondary | ICD-10-CM | POA: Diagnosis not present

## 2014-05-28 DIAGNOSIS — Z8709 Personal history of other diseases of the respiratory system: Secondary | ICD-10-CM | POA: Insufficient documentation

## 2014-05-28 DIAGNOSIS — R079 Chest pain, unspecified: Secondary | ICD-10-CM | POA: Diagnosis not present

## 2014-05-28 DIAGNOSIS — Z79899 Other long term (current) drug therapy: Secondary | ICD-10-CM | POA: Insufficient documentation

## 2014-05-28 DIAGNOSIS — Z7982 Long term (current) use of aspirin: Secondary | ICD-10-CM | POA: Insufficient documentation

## 2014-05-28 DIAGNOSIS — Z8739 Personal history of other diseases of the musculoskeletal system and connective tissue: Secondary | ICD-10-CM | POA: Diagnosis not present

## 2014-05-28 DIAGNOSIS — Z862 Personal history of diseases of the blood and blood-forming organs and certain disorders involving the immune mechanism: Secondary | ICD-10-CM | POA: Diagnosis not present

## 2014-05-29 ENCOUNTER — Emergency Department (HOSPITAL_COMMUNITY): Payer: 59

## 2014-05-29 ENCOUNTER — Encounter (HOSPITAL_COMMUNITY): Payer: Self-pay | Admitting: Adult Health

## 2014-05-29 ENCOUNTER — Emergency Department (HOSPITAL_COMMUNITY)
Admission: EM | Admit: 2014-05-29 | Discharge: 2014-05-29 | Disposition: A | Discharge status: Home / Self Care | Payer: 59 | Attending: Emergency Medicine | Admitting: Emergency Medicine

## 2014-05-29 DIAGNOSIS — R079 Chest pain, unspecified: Secondary | ICD-10-CM

## 2014-05-29 LAB — CBC
HEMATOCRIT: 37.8 % (ref 36.0–46.0)
HEMOGLOBIN: 12.1 g/dL (ref 12.0–15.0)
MCH: 28.2 pg (ref 26.0–34.0)
MCHC: 32 g/dL (ref 30.0–36.0)
MCV: 88.1 fL (ref 78.0–100.0)
Platelets: 191 10*3/uL (ref 150–400)
RBC: 4.29 MIL/uL (ref 3.87–5.11)
RDW: 13.8 % (ref 11.5–15.5)
WBC: 5 10*3/uL (ref 4.0–10.5)

## 2014-05-29 LAB — BASIC METABOLIC PANEL
Anion gap: 10 (ref 5–15)
BUN: 20 mg/dL (ref 6–23)
CALCIUM: 11.1 mg/dL — AB (ref 8.4–10.5)
CO2: 23 mmol/L (ref 19–32)
Chloride: 103 mmol/L (ref 96–112)
Creatinine, Ser: 0.72 mg/dL (ref 0.50–1.10)
GFR calc non Af Amer: 89 mL/min — ABNORMAL LOW (ref >90)
GLUCOSE: 118 mg/dL — AB (ref 70–99)
Potassium: 3.4 mmol/L — ABNORMAL LOW (ref 3.5–5.1)
SODIUM: 136 mmol/L (ref 135–145)

## 2014-05-29 LAB — I-STAT TROPONIN, ED: Troponin i, poc: 0.01 ng/mL (ref 0.00–0.08)

## 2014-05-29 MED ORDER — ASPIRIN 325 MG PO TABS: 325.0000 mg | ORAL_TABLET | ORAL | Status: DC

## 2014-05-29 MED ORDER — ASPIRIN 81 MG PO CHEW
324.0000 mg | CHEWABLE_TABLET | Freq: Once | ORAL | Status: AC
  Administered 2014-05-29: 324 mg via ORAL
  Filled 2014-05-29: qty 4
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
## 2014-05-29 NOTE — ED Notes (Addendum)
Presents with back pain began this evening, took a meloxicam and pain was easing off, all of a sudden pt reports, "i got very hot, nauseated, SOB and dizzy, I had sever painin my epigastric area and it radiated in my back, I thought I was going to throw up. I felt like I was about to pass out. I called for husband and he gave me a nitro and my pain instantly went away" denies paiin at this time, pt is pale. Pain began while at rest-she reports painis beginning to come back in the back.

## 2014-05-29 NOTE — Discharge Instructions (Signed)

## 2014-06-08 ENCOUNTER — Encounter: Payer: Self-pay | Admitting: Cardiology

## 2014-06-08 ENCOUNTER — Ambulatory Visit (INDEPENDENT_AMBULATORY_CARE_PROVIDER_SITE_OTHER): Payer: 59 | Admitting: Cardiology

## 2014-06-08 VITALS — BP 110/62 | HR 65 | Ht 63.0 in | Wt 129.2 lb

## 2014-06-08 DIAGNOSIS — I201 Angina pectoris with documented spasm: Secondary | ICD-10-CM

## 2014-06-08 DIAGNOSIS — I10 Essential (primary) hypertension: Secondary | ICD-10-CM | POA: Diagnosis not present

## 2014-06-08 MED ORDER — ISOSORBIDE MONONITRATE ER 30 MG PO TB24: 30.0000 mg | ORAL_TABLET | Freq: Every day | ORAL | Status: DC
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
## 2014-06-08 NOTE — Patient Instructions (Signed)
Medication Instructions:    START TAKING IMDUR 30 MG ONCE DAILY  DR NELSON WANTS YOU TO PURCHASE AND START TAKING RED YEAST RICE OTC DAILY--YOU CAN GET THIS AT YOUR LOCAL PHARMACY    Follow-Up: Your physician wants you to follow-up in: [place] will receive a reminder letter in the mail two months in advance. If you don't receive a letter, please call our office to schedule the follow-up appointment.

## 2014-06-13 ENCOUNTER — Telehealth: Payer: Self-pay | Admitting: Cardiology

## 2014-06-13 NOTE — Telephone Encounter (Signed)
New Message    Pt c/o medication issue:  1. Name of [place] extended relief 2. How are you currently taking this medication (dosage and times per day)?30mg /daily   3. Are you having a reaction (difficulty breathing--STAT)?migraine and vomiting   4. What is your medication issue? Cardiac spasms

## 2014-06-13 NOTE — Telephone Encounter (Signed)
LMTCB  Will also forward medication concern to Dr Meda Coffee for further review and recommendation of med.

## 2014-06-13 NOTE — Telephone Encounter (Signed)
Informed the pt that per Dr Meda Coffee it is safe for her to d/c the Imdur 30 mg due to intolerance, and take her Nitro SL 0.4 mg PRN for her episodes of prinzmental angina.  Informed the pt that per Dr Meda Coffee if she can tolerate taking 1/2 the dose of Imdur at 15 mg po daily that's safe as well.  Informed the pt that getting a headache is a normal side effect of Imdur, and will subside with time.  Pt states that she will try taking 1/2 dose of Imdur 15 mg po daily, and see how this works, and if it still causes her migraines she will d/c Imdur all together and just use the Nitro SL PRN for chest pain.  Pt verbalized understanding and agrees with this plan.

## 2014-07-27 ENCOUNTER — Telehealth: Payer: Self-pay

## 2014-07-27 DIAGNOSIS — K769 Liver disease, unspecified: Secondary | ICD-10-CM

## 2014-07-27 DIAGNOSIS — R9389 Abnormal findings on diagnostic imaging of other specified body structures: Secondary | ICD-10-CM

## 2014-07-27 NOTE — Telephone Encounter (Signed)
Recommended MRI with Eovist. See imaging report 11/15

## 2014-07-27 NOTE — Telephone Encounter (Signed)
-----   Message from Barron Alvine, [place] sent at 01/24/2014 10:02 AM EST ----- She needs repeat MRI in 6 months, if no changes then will likely not need further imaging

## 2014-07-29 NOTE — Telephone Encounter (Signed)
You have been scheduled for an MRI at [facility] on 08/11/14. Your appointment time is 8 am. Please arrive 15 minutes prior to your appointment time for registration purposes. Please make certain not to have anything to eat or drink 6 hours prior to your test. In addition, if you have any metal in your body, have a pacemaker or defibrillator, please be sure to let your ordering physician know. This test typically takes 45 minutes to 1 hour to complete.  The patient has been notified of this information and all questions answered.

## 2014-08-11 ENCOUNTER — Ambulatory Visit (HOSPITAL_COMMUNITY)
Admission: RE | Admit: 2014-08-11 | Discharge: 2014-08-11 | Disposition: A | Discharge status: Home / Self Care | Payer: 59 | Source: Ambulatory Visit | Attending: Gastroenterology | Admitting: Gastroenterology

## 2014-08-11 ENCOUNTER — Ambulatory Visit (HOSPITAL_COMMUNITY): Payer: 59

## 2014-08-11 ENCOUNTER — Other Ambulatory Visit: Payer: Self-pay | Admitting: Gastroenterology

## 2014-08-11 DIAGNOSIS — K769 Liver disease, unspecified: Secondary | ICD-10-CM | POA: Diagnosis not present

## 2014-08-11 DIAGNOSIS — N281 Cyst of kidney, acquired: Secondary | ICD-10-CM | POA: Insufficient documentation

## 2014-08-11 DIAGNOSIS — R938 Abnormal findings on diagnostic imaging of other specified body structures: Secondary | ICD-10-CM | POA: Diagnosis present

## 2014-08-11 DIAGNOSIS — R9389 Abnormal findings on diagnostic imaging of other specified body structures: Secondary | ICD-10-CM

## 2014-08-11 LAB — POCT I-STAT CREATININE: CREATININE: 0.7 mg/dL (ref 0.44–1.00)

## 2014-08-11 MED ORDER — GADOXETATE DISODIUM 0.25 MMOL/ML IV SOLN
5.0000 mL | Freq: Once | INTRAVENOUS | Status: AC | PRN
  Administered 2014-08-11: 5 mL via INTRAVENOUS

## 2014-09-20 ENCOUNTER — Ambulatory Visit: Payer: 59 | Admitting: Internal Medicine

## 2014-12-12 ENCOUNTER — Ambulatory Visit (INDEPENDENT_AMBULATORY_CARE_PROVIDER_SITE_OTHER): Payer: 59 | Admitting: Cardiology

## 2014-12-12 ENCOUNTER — Encounter: Payer: Self-pay | Admitting: Cardiology

## 2014-12-12 VITALS — BP 126/58 | HR 64 | Ht 63.0 in | Wt 130.0 lb

## 2014-12-12 DIAGNOSIS — I201 Angina pectoris with documented spasm: Secondary | ICD-10-CM

## 2014-12-12 DIAGNOSIS — E785 Hyperlipidemia, unspecified: Secondary | ICD-10-CM

## 2014-12-12 DIAGNOSIS — I10 Essential (primary) hypertension: Secondary | ICD-10-CM | POA: Diagnosis not present
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
## 2014-12-12 NOTE — Patient Instructions (Signed)
Medication Instructions:   Your physician recommends that you continue on your current medications as directed. Please refer to the Current Medication list given to you today.    Labwork:  ONE YEAR PRIOR TO YOUR ONE YEAR FOLLOW-UP APPOINTMENT WITH DR Meda Coffee TO CHECK A CMET AND LIPIDS--PLEASE COME FASTING TO THIS APPOINTMENT      Follow-Up:  ONE YEAR WITH DR NELSON---PLEASE HAVE YOUR LABS DONE PRIOR TO THIS OFFICE VISIT

## 2015-01-30 ENCOUNTER — Other Ambulatory Visit: Payer: Self-pay

## 2015-01-30 DIAGNOSIS — Z1231 Encounter for screening mammogram for malignant neoplasm of breast: Secondary | ICD-10-CM

## 2015-02-07 ENCOUNTER — Telehealth: Payer: Self-pay | Admitting: Internal Medicine

## 2015-02-07 MED ORDER — HYDROCHLOROTHIAZIDE 25 MG PO TABS: 25.0000 mg | ORAL_TABLET | Freq: Every day | ORAL | Status: DC | PRN

## 2015-02-07 NOTE — Telephone Encounter (Signed)
Pt refill for hydrochlorothiazide (HYDRODIURIL) 25 MG tablet [id number]  Pharmacy is [facility] on [address]

## 2015-02-14 ENCOUNTER — Ambulatory Visit (INDEPENDENT_AMBULATORY_CARE_PROVIDER_SITE_OTHER): Payer: 59 | Admitting: Adult Health

## 2015-02-14 ENCOUNTER — Encounter: Payer: Self-pay | Admitting: Adult Health

## 2015-02-14 VITALS — BP 130/82 | HR 75 | Temp 97.5°F | Wt 129.9 lb

## 2015-02-14 DIAGNOSIS — N3001 Acute cystitis with hematuria: Secondary | ICD-10-CM

## 2015-02-14 LAB — POCT URINALYSIS DIPSTICK
Bilirubin, UA: NEGATIVE
Glucose, UA: NEGATIVE
KETONES UA: NEGATIVE
Nitrite, UA: NEGATIVE
Protein, UA: NEGATIVE
Spec Grav, UA: 1.02
Urobilinogen, UA: NEGATIVE
pH, UA: 7

## 2015-02-14 MED ORDER — CIPROFLOXACIN HCL 500 MG PO TABS: 500.0000 mg | ORAL_TABLET | Freq: Two times a day (BID) | ORAL | Status: DC

## 2015-02-14 MED ORDER — PHENAZOPYRIDINE HCL 200 MG PO TABS: 200.0000 mg | ORAL_TABLET | Freq: Three times a day (TID) | ORAL | Status: DC | PRN

## 2015-02-14 NOTE — Patient Instructions (Signed)

## 2015-02-14 NOTE — Addendum Note (Signed)
Addended by: Gari Crown D on: 02/14/2015 04:35 PM   Modules accepted: Orders

## 2015-02-14 NOTE — Progress Notes (Signed)
  [id number] Bethany Reichmann, MD Chief Complaint  Patient presents with  . Urinary Tract Infection    Current Issues:  Presents with 5 days of dysuria, urinary urgency and urinary frequency Associated symptoms include:  dysuria, flank pain bilaterally, hematuria, lower abdominal pain, urinary frequency and urinary urgency  There is a previous history of of similar symptoms.   Prior to Admission medications   Medication Sig Start Date End Date Taking? Authorizing Provider  acetaminophen (TYLENOL) 500 MG tablet Take 500 mg by mouth every 8 (eight) hours as needed for moderate pain.   Yes Historical Provider, MD  aspirin 81 MG EC tablet Take 1 tablet (81 mg total) by mouth daily. Swallow whole. 02/17/12  Yes Biagio Borg, MD  cetirizine (ZYRTEC) 10 MG tablet Take 10 mg by mouth daily as needed for allergies.   Yes Historical Provider, MD  chlorpheniramine (CHLOR-TRIMETON) 4 MG tablet Take 4 mg by mouth daily as needed.    Yes Historical Provider, MD  cholecalciferol (VITAMIN D) 1000 UNITS tablet Take 1,000 Units by mouth daily.   Yes Historical Provider, MD  doxycycline (VIBRAMYCIN) 100 MG capsule Take 100 mg by mouth daily as needed (rosacea).   Yes Historical Provider, MD  hydrochlorothiazide (HYDRODIURIL) 25 MG tablet Take 1 tablet (25 mg total) by mouth daily as needed (excess fluid). 02/07/15  Yes Biagio Borg, MD  hydroxypropyl methylcellulose (ISOPTO TEARS) 2.5 % ophthalmic solution Place 1 drop into both eyes daily as needed for dry eyes.   Yes Historical Provider, MD  isosorbide mononitrate (IMDUR) 30 MG 24 hr tablet Take 15 mg by mouth daily.   Yes Historical Provider, MD  nitroGLYCERIN (NITROSTAT) 0.4 MG SL tablet Place 1 tablet (0.4 mg total) under the tongue every 5 (five) minutes as needed for chest pain. 08/31/13  Yes Erlene Quan, PA-C    Review of Systems:  PE:  BP 130/82 mmHg  Pulse 75  Temp(Src) 97.5 F (36.4 C) (Oral)  Wt 129 lb 14.4 oz (58.922 kg) Constitutional. Alert and  oriented x 4. No acute distress Heart: Normal rate, normal rhythm. No gallop, murmur, or rub heard Lungs: Clear to auscultation Back: No CVA tenderness Abdomen: No additional pain with palpation. No masses felt.     Assessment and Plan:    1. Acute cystitis with hematuria [N30.01] - ciprofloxacin (CIPRO) 500 MG tablet; Take 1 tablet (500 mg total) by mouth 2 (two) times daily.  Dispense: 10 tablet; Refill: 0 - phenazopyridine (PYRIDIUM) 200 MG tablet; Take 1 tablet (200 mg total) by mouth 3 (three) times daily as needed for pain.  Dispense: 10 tablet; Refill: 0 - POCT urinalysis dipstick- 2 + Leuk, 3+ blood - Follow up if no improvement in the next 2-3 days.

## 2015-02-14 NOTE — Progress Notes (Signed)
Pre visit review using our clinic review tool, if applicable. No additional management support is needed unless otherwise documented below in the visit note. 

## 2015-02-16 LAB — URINE CULTURE: Colony Count: 30000

## 2015-02-28 ENCOUNTER — Ambulatory Visit: Payer: 59

## 2015-03-06 ENCOUNTER — Ambulatory Visit: Payer: 59

## 2015-03-23 ENCOUNTER — Ambulatory Visit
Admission: RE | Admit: 2015-03-23 | Discharge: 2015-03-23 | Disposition: A | Discharge status: Home / Self Care | Payer: 59 | Source: Ambulatory Visit

## 2015-03-23 DIAGNOSIS — Z1231 Encounter for screening mammogram for malignant neoplasm of breast: Secondary | ICD-10-CM

## 2015-07-12 ENCOUNTER — Other Ambulatory Visit: Payer: Self-pay | Admitting: Internal Medicine

## 2015-07-12 ENCOUNTER — Other Ambulatory Visit: Payer: Self-pay | Admitting: Cardiology

## 2015-07-12 NOTE — Telephone Encounter (Signed)
Rx request sent to pharmacy.  

## 2015-08-12 IMAGING — CT CT ANGIO CHEST
2 of 9 series · 16 of 46 positions shown · IV contrast (omnipaque)
Comparison: None.

CLINICAL DATA: Chest pain.  Hyperventilated.

EXAM:
CT ANGIOGRAPHY CHEST, ABDOMEN AND PELVIS
TECHNIQUE: Multidetector CT imaging through the chest, abdomen and pelvis was
performed using the standard protocol during bolus administration of
intravenous contrast. Multiplanar reconstructed images and MIPs were
obtained and reviewed to evaluate the vascular anatomy.
CONTRAST:  100mL OMNIPAQUE IOHEXOL 350 MG/ML SOLN

[Series 5: dissection 2.0 i30f 1 · axial · 0.62mm/px · z∈[+723,+1265]mm · 13 of 305 slices shown]
[im 17/305  lung]
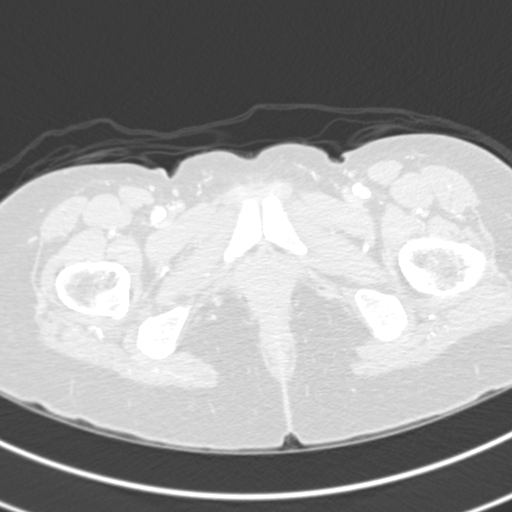
[im 34/305  soft-tissue]
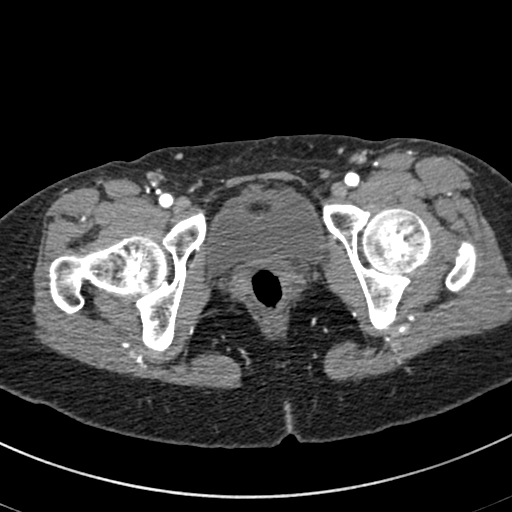
[im 68/305  lung]
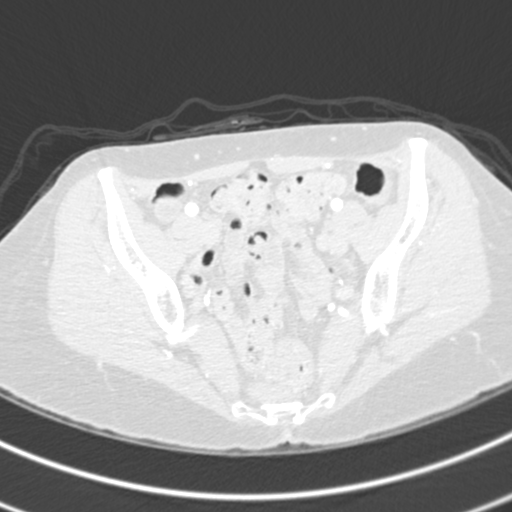
[im 85/305  soft-tissue]
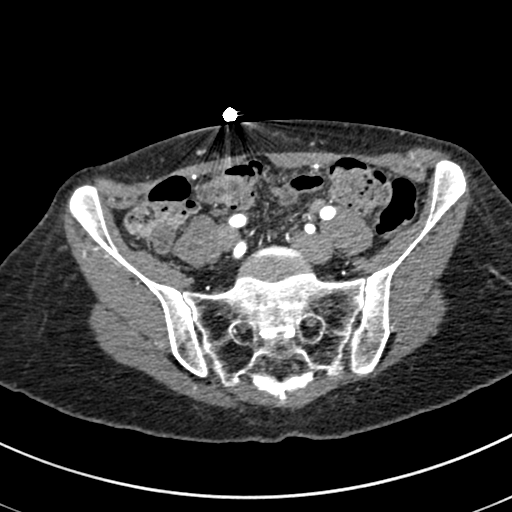
[im 102/305  lung]
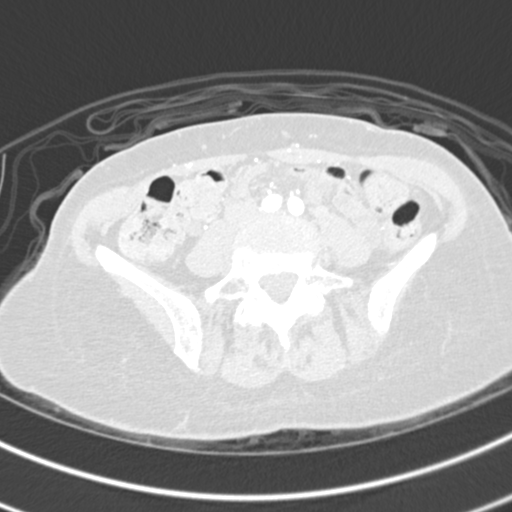
[im 136/305  soft-tissue]
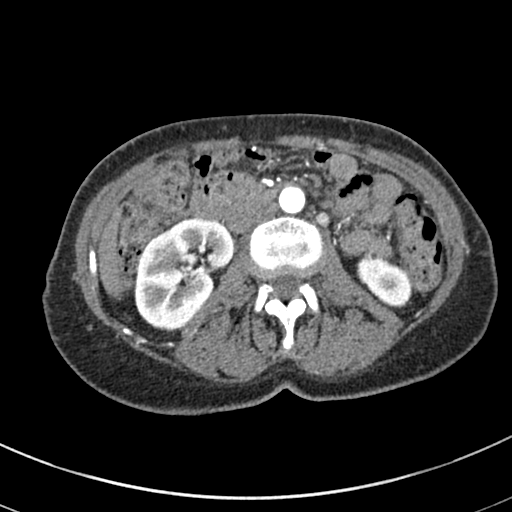
[im 153/305  lung]
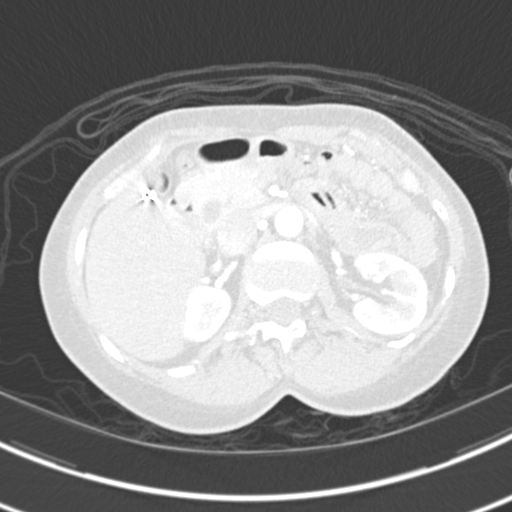
[im 169/305  soft-tissue]
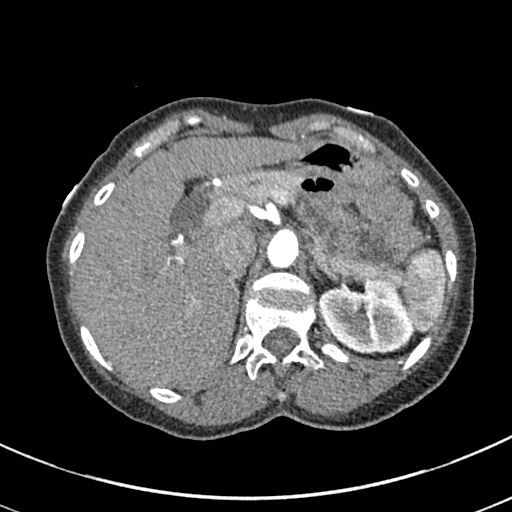
[im 203/305  lung]
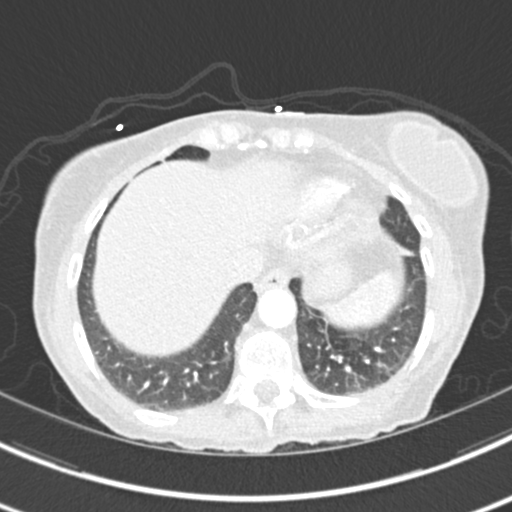
[im 220/305  soft-tissue]
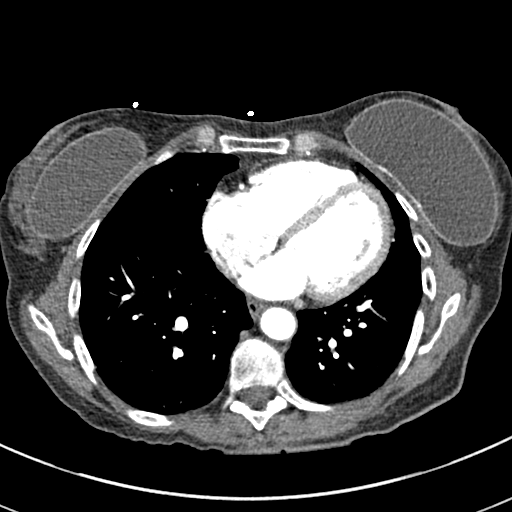
[im 237/305  lung]
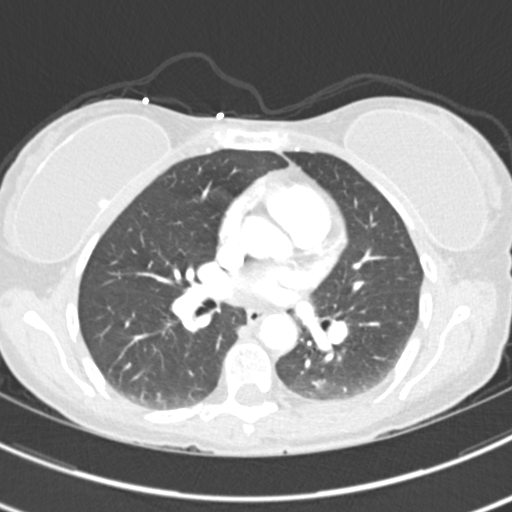
[im 271/305  soft-tissue]
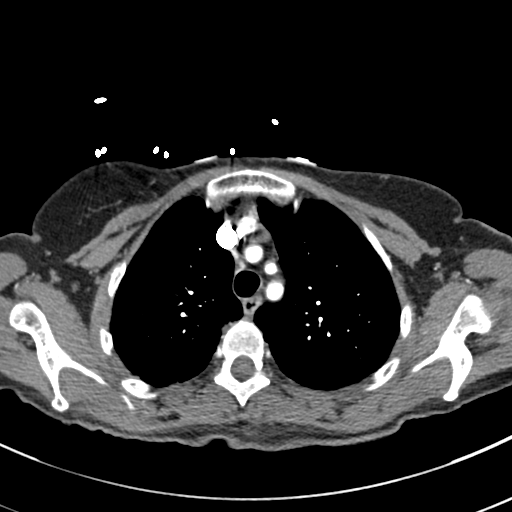
[im 288/305  lung]
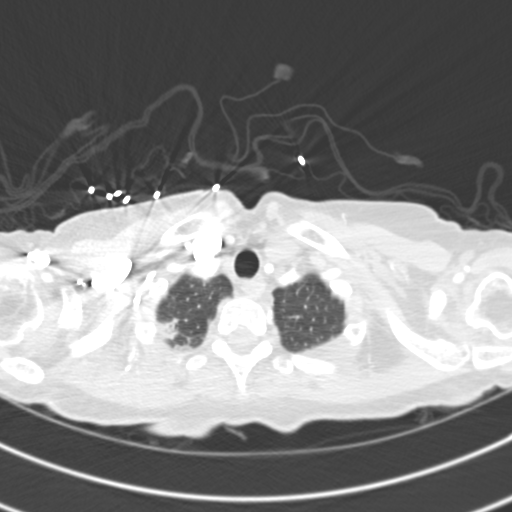

[Series 7: coronal mpr · coronal · 0.62mm/px · 3 of 102 slices shown]
[im 26/102  soft-tissue]
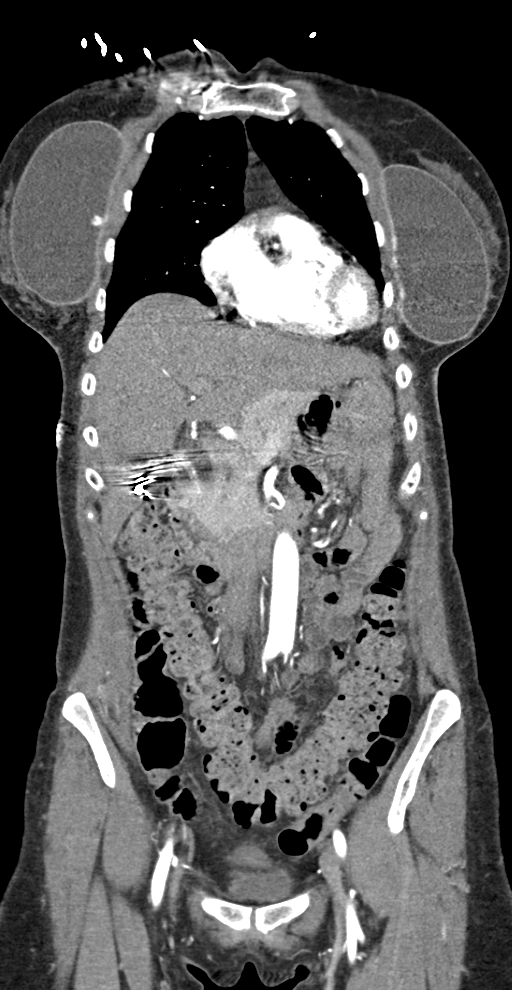
[im 51/102  soft-tissue]
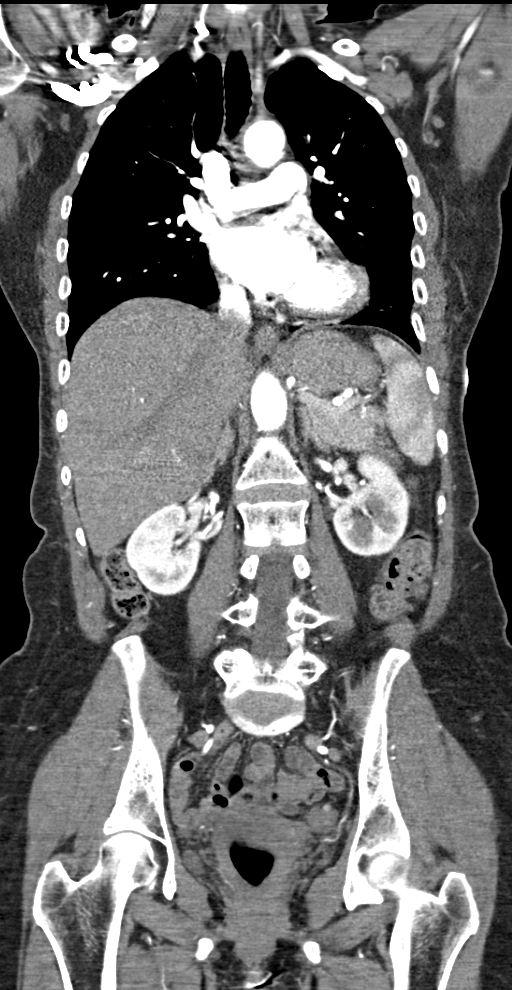
[im 76/102  soft-tissue]
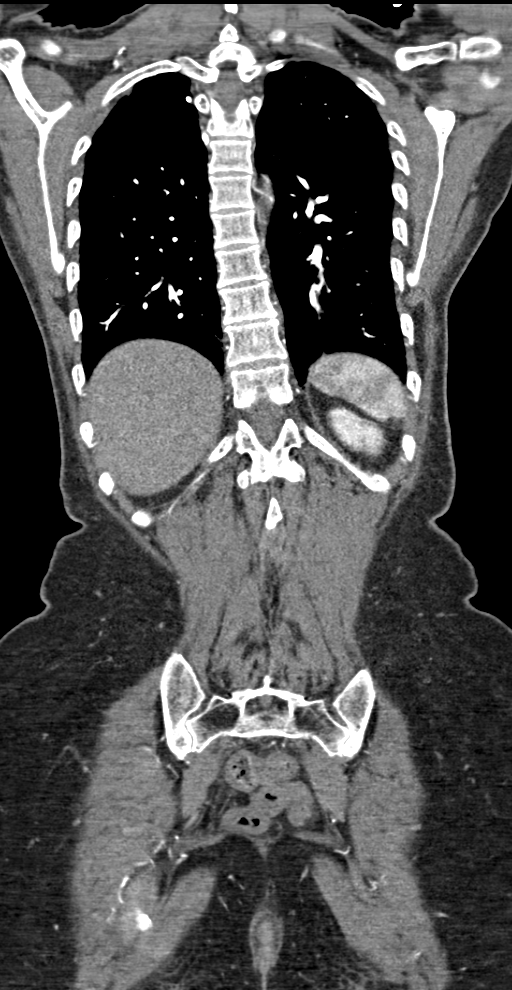

[16 of 46 positions shown; findings below may reference images not displayed]

FINDINGS: CTA CHEST FINDINGS

No filling defects in the pulmonary arteries to suggest pulmonary
emboli. Heart is normal size. Aorta is normal caliber. No
dissection. Areas of biapical and bibasilar scarring. No confluent
airspace opacities. No effusions.

No mediastinal, hilar, or axillary adenopathy. Chest wall soft
tissues are unremarkable. Bilateral breast implants noted.

No acute bony abnormality or focal bone lesion.

Review of the MIP images confirms the above findings.

CTA ABDOMEN AND PELVIS FINDINGS

No evidence of aortic aneurysm or dissection. The iliac and femoral
vessels are normal caliber. Mesenteric vessels and renal arteries
are widely patent.

Prior cholecystectomy. Common bile duct is dilated, measuring up to
12 mm. Mild prominence of the pancreatic duct. No focal hepatic
lesion. Spleen, pancreas, adrenals and kidneys are unremarkable.
There is mild soft tissue fullness in the region of the ampulla.
Difficult to completely exclude ampullary lesion.

Hypervascular area noted centrally within the liver measuring 14 mm.
Small hypervascular area noted posteriorly in the right hepatic
lobe. No other focal hepatic lesions.

Scattered sigmoid diverticulosis. No active diverticulitis. Small
bowel is decompressed. No free fluid, free air or adenopathy.

Uterus, adnexae and urinary bladder are unremarkable.

No acute bony abnormality or focal bone lesion.

Review of the MIP images confirms the above findings.
IMPRESSION: No evidence of aortic aneurysm or dissection. No evidence of
pulmonary embolus.

Small hypervascular areas within the liver, nonspecific. I favor
these represent flash filling of hemangiomas.

Prior cholecystectomy. Mild common bile duct and pancreatic ductal
dilatation the soft tissue fullness in the region of the ampulla.
Recommend correlation with LFTs. This could be further evaluated
with MRI of the abdomen with and without contrast.

## 2015-09-15 ENCOUNTER — Other Ambulatory Visit: Payer: Self-pay | Admitting: Internal Medicine

## 2015-10-09 ENCOUNTER — Encounter: Payer: Self-pay | Admitting: Physician Assistant

## 2015-10-09 ENCOUNTER — Ambulatory Visit (INDEPENDENT_AMBULATORY_CARE_PROVIDER_SITE_OTHER): Payer: Medicare Other | Admitting: Physician Assistant

## 2015-10-09 VITALS — BP 120/70 | HR 78 | Ht 63.0 in | Wt 118.8 lb

## 2015-10-09 DIAGNOSIS — Z0000 Encounter for general adult medical examination without abnormal findings: Secondary | ICD-10-CM

## 2015-10-09 DIAGNOSIS — I201 Angina pectoris with documented spasm: Secondary | ICD-10-CM | POA: Diagnosis not present

## 2015-10-09 DIAGNOSIS — E785 Hyperlipidemia, unspecified: Secondary | ICD-10-CM | POA: Diagnosis not present

## 2015-10-09 DIAGNOSIS — R002 Palpitations: Secondary | ICD-10-CM | POA: Diagnosis not present

## 2015-10-09 DIAGNOSIS — R42 Dizziness and giddiness: Secondary | ICD-10-CM

## 2015-10-09 LAB — LIPID PANEL
Cholesterol: 170 mg/dL (ref 125–200)
HDL: 83 mg/dL (ref >=46)
LDL CALC: 70 mg/dL (ref <130)
TRIGLYCERIDES: 85 mg/dL (ref <150)
Total CHOL/HDL Ratio: 2 Ratio (ref <=5.0)
VLDL: 17 mg/dL (ref <30)

## 2015-10-09 LAB — CBC WITH DIFFERENTIAL/PLATELET
BASOS PCT: 1 %
Basophils Absolute: 42 cells/uL (ref 0–200)
EOS ABS: 210 {cells}/uL (ref 15–500)
EOS PCT: 5 %
HCT: 41.1 % (ref 35.0–45.0)
HEMOGLOBIN: 13.6 g/dL (ref 11.7–15.5)
Lymphs Abs: 1596 cells/uL (ref 850–3900)
MCH: 29.3 pg (ref 27.0–33.0)
MCHC: 33.1 g/dL (ref 32.0–36.0)
MCV: 88.6 fL (ref 80.0–100.0)
MPV: 9.7 fL (ref 7.5–12.5)
Monocytes Absolute: 420 cells/uL (ref 200–950)
Monocytes Relative: 10 %
NEUTROS ABS: 1932 {cells}/uL (ref 1500–7800)
Neutrophils Relative %: 46 %
PLATELETS: 228 10*3/uL (ref 140–400)
RBC: 4.64 MIL/uL (ref 3.80–5.10)
RDW: 13.3 % (ref 11.0–15.0)
WBC: 4.2 10*3/uL (ref 3.8–10.8)

## 2015-10-09 LAB — COMPREHENSIVE METABOLIC PANEL
ALBUMIN: 4.1 g/dL (ref 3.6–5.1)
ALT: 11 U/L (ref 6–29)
AST: 16 U/L (ref 10–35)
Alkaline Phosphatase: 79 U/L (ref 33–130)
BUN: 17 mg/dL (ref 7–25)
CO2: 31 mmol/L (ref 20–31)
CREATININE: 0.64 mg/dL (ref 0.50–0.99)
Calcium: 10.7 mg/dL — ABNORMAL HIGH (ref 8.6–10.4)
Chloride: 106 mmol/L (ref 98–110)
Glucose, Bld: 81 mg/dL (ref 65–99)
Potassium: 4.2 mmol/L (ref 3.5–5.3)
SODIUM: 141 mmol/L (ref 135–146)
Total Bilirubin: 0.5 mg/dL (ref 0.2–1.2)
Total Protein: 6.3 g/dL (ref 6.1–8.1)

## 2015-10-09 NOTE — Patient Instructions (Signed)
Medication Instructions: Your physician recommends that you continue on your current medications as directed. Please refer to the Current Medication list given to you today.   Labwork: TODAY : CMET, LIPID, VIT D, CBC, and TSH  Procedures/Testing: NONE  Follow-Up: Your physician wants you to follow-up in 6 MONTHS with Dr. Johann Capers will receive a reminder letter in the mail two months in advance. If you don't receive a letter, please call our office to schedule the follow-up appointment.   Any Additional Special Instructions Will Be Listed Below (If Applicable).     If you need a refill on your cardiac medications before your next appointment, please call your pharmacy.
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
## 2015-10-10 ENCOUNTER — Encounter: Payer: Self-pay | Admitting: Nurse Practitioner

## 2015-10-10 ENCOUNTER — Ambulatory Visit (INDEPENDENT_AMBULATORY_CARE_PROVIDER_SITE_OTHER): Payer: Medicare Other | Admitting: Nurse Practitioner

## 2015-10-10 VITALS — BP 118/78 | HR 79 | Temp 97.9°F

## 2015-10-10 DIAGNOSIS — I201 Angina pectoris with documented spasm: Secondary | ICD-10-CM

## 2015-10-10 DIAGNOSIS — L237 Allergic contact dermatitis due to plants, except food: Secondary | ICD-10-CM | POA: Diagnosis not present

## 2015-10-10 MED ORDER — DIPHENHYDRAMINE HCL 25 MG PO CAPS: 25.0000 mg | ORAL_CAPSULE | Freq: Three times a day (TID) | ORAL | 0 refills | Status: DC | PRN

## 2015-10-10 MED ORDER — METHYLPREDNISOLONE ACETATE 40 MG/ML IJ SUSP: 40.0000 mg | Freq: Once | INTRAMUSCULAR | Status: DC

## 2015-10-10 MED ORDER — PREDNISONE 10 MG PO TABS: ORAL_TABLET | ORAL | 0 refills | Status: DC

## 2015-10-10 MED ORDER — CALAMINE EX LOTN: 1.0000 "application " | TOPICAL_LOTION | CUTANEOUS | 0 refills | Status: DC | PRN

## 2015-10-10 MED ORDER — METHYLPREDNISOLONE ACETATE 40 MG/ML IJ SUSP
40.0000 mg | Freq: Once | INTRAMUSCULAR | Status: AC
  Administered 2015-10-10: 40 mg via INTRAMUSCULAR

## 2015-10-10 NOTE — Progress Notes (Addendum)
Subjective:  Patient ID: Bethany Carter, female    DOB: [birth year]  Age: 65 y.o. MRN: [id number]  CC: Rash (onset after working in garden on sunday)   Rash  The current episode started in the past 7 days. The problem has been gradually worsening since onset. The affected locations include the neck, face, chest, right arm and left arm. The rash is characterized by itchiness and redness. She was exposed to plant contact (pulling weeds in daughter's garden). Pertinent negatives include no congestion, eye pain, facial edema, fever, rhinorrhea, shortness of breath or sore throat. Past treatments include anti-itch cream. The treatment provided no relief.     Outpatient Medications Prior to Visit  Medication Sig Dispense Refill  . acetaminophen (TYLENOL) 500 MG tablet Take 500 mg by mouth every 8 (eight) hours as needed for moderate pain.    Marland Kitchen aspirin 81 MG EC tablet Take 1 tablet (81 mg total) by mouth daily. Swallow whole. 30 tablet 12  . cetirizine (ZYRTEC) 10 MG tablet Take 10 mg by mouth daily as needed for allergies.    . hydrochlorothiazide (HYDRODIURIL) 25 MG tablet TAKE 1 TABLET BY MOUTH EVERY DAY AS NEEDED FOR EXCESS FLUID 30 tablet 0  . hydroxypropyl methylcellulose (ISOPTO TEARS) 2.5 % ophthalmic solution Place 1 drop into both eyes daily as needed for dry eyes.    . isosorbide mononitrate (IMDUR) 30 MG 24 hr tablet TAKE 1 TABLET (30 MG TOTAL) BY MOUTH DAILY. 90 tablet 1  . nitroGLYCERIN (NITROSTAT) 0.4 MG SL tablet Place 1 tablet (0.4 mg total) under the tongue every 5 (five) minutes as needed for chest pain. 25 tablet 2  . ciprofloxacin (CIPRO) 500 MG tablet Take 1 tablet (500 mg total) by mouth 2 (two) times daily. 10 tablet 0   No facility-administered medications prior to visit.     ROS Review of Systems  Constitutional: Negative for fever.  HENT: Negative for congestion, rhinorrhea and sore throat.   Eyes: Negative for pain.  Respiratory: Negative for shortness of breath.     Skin: Positive for rash.    Objective:  BP 118/78   Pulse 79   Temp 97.9 F (36.6 C)   SpO2 99%   BP Readings from Last 3 Encounters:  10/10/15 118/78  10/09/15 120/70  02/14/15 130/82    Wt Readings from Last 3 Encounters:  10/09/15 118 lb 12.8 oz (53.9 kg)  02/14/15 129 lb 14.4 oz (58.9 kg)  12/12/14 130 lb (59 kg)    Physical Exam  Constitutional: She is oriented to person, place, and time. She appears well-developed. No distress.  HENT:  Right Ear: External ear normal.  Left Ear: External ear normal.  Nose: Nose normal.  Mouth/Throat: Oropharynx is clear and moist. No oropharyngeal exudate.  Eyes: Conjunctivae and EOM are normal.  Neck: Normal range of motion. Neck supple.  Cardiovascular: Normal rate.   Pulmonary/Chest: Effort normal.  Lymphadenopathy:    She has no cervical adenopathy.  Neurological: She is alert and oriented to person, place, and time.  Skin: Skin is warm and dry. Rash noted. Rash is macular and vesicular.     No drianage, no induration.  Vitals reviewed.   Lab Results  Component Value Date   WBC 4.2 10/09/2015   HGB 13.6 10/09/2015   HCT 41.1 10/09/2015   PLT 228 10/09/2015   GLUCOSE 81 10/09/2015   CHOL 170 10/09/2015   TRIG 85 10/09/2015   HDL 83 10/09/2015   LDLCALC 70 10/09/2015  ALT 11 10/09/2015   AST 16 10/09/2015   NA 141 10/09/2015   K 4.2 10/09/2015   CL 106 10/09/2015   CREATININE 0.64 10/09/2015   BUN 17 10/09/2015   CO2 31 10/09/2015   TSH 0.80 08/25/2013   INR 1.0 08/25/2013    Mm Screening Breast W/implant Tomo Bilateral  Result Date: 03/24/2015 CLINICAL DATA:  Screening. EXAM: DIGITAL SCREENING BILATERAL MAMMOGRAM WITH IMPLANTS, 3D TOMO WITH CAD The patient has retroglandular implants. Standard and implant displaced views were performed. COMPARISON:  Previous exam(s). ACR Breast Density Category c: The breast tissue is heterogeneously dense, which may obscure small masses. FINDINGS: There are no findings  suspicious for malignancy. Intact bilateral retroglandular saline implants. Images were processed with CAD. IMPRESSION: No mammographic evidence of malignancy. A result letter of this screening mammogram will be mailed directly to the patient. RECOMMENDATION: Screening mammogram in one year. (Code:SM-B-01Y) BI-RADS CATEGORY  2.: Benign. Electronically Signed   By: Ammie Ferrier M.D.   On: 03/24/2015 09:20    Assessment & Plan:   Bethany Carter was seen today for rash.  Diagnoses and all orders for this visit:  Allergic contact dermatitis due to plant -     methylPREDNISolone acetate (DEPO-MEDROL) injection 40 mg; Inject 1 mL (40 mg total) into the muscle once. -     diphenhydrAMINE (BENADRYL) 25 mg capsule; Take 1 capsule (25 mg total) by mouth every 8 (eight) hours as needed for itching or allergies. -     Discontinue: methylPREDNISolone acetate (DEPO-MEDROL) injection 40 mg; Inject 1 mL (40 mg total) into the muscle once.  Other orders -     predniSONE (DELTASONE) 10 MG tablet; Take 4tabs once a day x 2days, then 3tabs once a dayx 2days, then 2tabs once a day x3days, then, 1tab once a day x 3days, then stop -     calamine lotion; Apply 1 application topically as needed for itching.   I have discontinued [name]'s ciprofloxacin. I am also having her start on diphenhydrAMINE, predniSONE, and calamine. Additionally, I am having her maintain her aspirin, acetaminophen, hydroxypropyl methylcellulose / hypromellose, cetirizine, nitroGLYCERIN, isosorbide mononitrate, and hydrochlorothiazide. We administered methylPREDNISolone acetate.  Meds ordered this encounter  Medications  . methylPREDNISolone acetate (DEPO-MEDROL) injection 40 mg  . diphenhydrAMINE (BENADRYL) 25 mg capsule    Sig: Take 1 capsule (25 mg total) by mouth every 8 (eight) hours as needed for itching or allergies.    Dispense:  30 capsule    Refill:  0    Order Specific Question:   Supervising Provider    Answer:   Cassandria Anger [[id number]]  . predniSONE (DELTASONE) 10 MG tablet    Sig: Take 4tabs once a day x 2days, then 3tabs once a dayx 2days, then 2tabs once a day x3days, then, 1tab once a day x 3days, then stop    Dispense:  23 tablet    Refill:  0    Order Specific Question:   Supervising Provider    Answer:   Cassandria Anger [[id number]]  . calamine lotion    Sig: Apply 1 application topically as needed for itching.    Dispense:  120 mL    Refill:  0    Order Specific Question:   Supervising Provider    Answer:   Cassandria Anger [[id number]]  . DISCONTD: methylPREDNISolone acetate (DEPO-MEDROL) injection 40 mg     Follow-up: Return if symptoms worsen or fail to improve.  Wilfred Lacy, NP

## 2015-10-10 NOTE — Patient Instructions (Signed)
Poison Ivy Dermatitis- Will rx with solumedrol here in the office today.  Follow with short burst of oral prednisone. prednisone.  Pt was advised to call if increased erythema/swelling, if symptoms worsen or if symptoms are not improved in 2week.  Add daily antihistamine such as zyrtec 10mg  once daily. OK to take 1 tablet of benadryl at bedtime as needed for itching.    Start oral prednisone tomorrow, take with food.

## 2015-10-10 NOTE — Progress Notes (Signed)
Pre visit review using our clinic review tool, if applicable. No additional management support is needed unless otherwise documented below in the visit note. 

## 2015-10-11 ENCOUNTER — Telehealth: Payer: Self-pay | Admitting: Physician Assistant

## 2015-10-11 NOTE — Telephone Encounter (Signed)
Returned pts call re: lab results. Left another message for her to call back. 

## 2015-10-11 NOTE — Telephone Encounter (Signed)
-----   Message from Imogene Burn, PA-C sent at 10/11/2015  7:37 AM EDT ----- Labs all stable

## 2015-10-11 NOTE — Telephone Encounter (Signed)
Returned pts call and went over lab results.

## 2015-10-11 NOTE — Telephone Encounter (Signed)
New message ° ° °Patient returning call back to nurse.  °

## 2015-10-13 ENCOUNTER — Ambulatory Visit (INDEPENDENT_AMBULATORY_CARE_PROVIDER_SITE_OTHER): Payer: Medicare Other | Admitting: Family

## 2015-10-13 ENCOUNTER — Encounter: Payer: Self-pay | Admitting: Family

## 2015-10-13 DIAGNOSIS — I201 Angina pectoris with documented spasm: Secondary | ICD-10-CM | POA: Diagnosis not present

## 2015-10-13 DIAGNOSIS — L237 Allergic contact dermatitis due to plants, except food: Secondary | ICD-10-CM | POA: Diagnosis not present

## 2015-10-13 MED ORDER — TRIAMCINOLONE ACETONIDE 0.1 % EX CREA: 1.0000 "application " | TOPICAL_CREAM | Freq: Two times a day (BID) | CUTANEOUS | 0 refills | Status: DC

## 2015-10-13 MED ORDER — PREDNISONE 20 MG PO TABS: ORAL_TABLET | ORAL | 0 refills | Status: DC

## 2015-10-13 NOTE — Patient Instructions (Addendum)
   Thank you for choosing [facility].  Summary/Instructions:   Take 3  - 10 mg tablets tonight.   Take 3 -  20 mg tablets for 3 Days.  Take 2 - 20 mg tablets for 4 days  Take 2 -  10 mg tablets for 4 days.    Your prescription(s) have been submitted to your pharmacy or been printed and provided for you. Please take as directed and contact our office if you believe you are having problem(s) with the medication(s) or have any questions.   If your symptoms worsen or fail to improve, please contact our office for further instruction, or in case of emergency go directly to the emergency room at the closest medical facility.    Poison [employer] ivy is a inflammation of the skin (contact dermatitis) caused by touching the allergens on the leaves of the ivy plant following previous exposure to the plant. The rash usually appears 48 hours after exposure. The rash is usually bumps (papules) or blisters (vesicles) in a linear pattern. Depending on your own sensitivity, the rash may simply cause redness and itching, or it may also progress to blisters which may break open. These must be well cared for to prevent secondary bacterial (germ) infection, followed by scarring. Keep any open areas dry, clean, dressed, and covered with an antibacterial ointment if needed. The eyes may also get puffy. The puffiness is worst in the morning and gets better as the day progresses. This dermatitis usually heals without scarring, within 2 to 3 weeks without treatment. HOME CARE INSTRUCTIONS  Thoroughly wash with soap and water as soon as you have been exposed to poison ivy. You have about one half hour to remove the plant resin before it will cause the rash. This washing will destroy the oil or antigen on the skin that is causing, or will cause, the rash. Be sure to wash under your fingernails as any plant resin there will continue to spread the rash. Do not rub skin vigorously when washing affected area.  Poison ivy cannot spread if no oil from the plant remains on your body. A rash that has progressed to weeping sores will not spread the rash unless you have not washed thoroughly. It is also important to wash any clothes you have been wearing as these may carry active allergens. The rash will return if you wear the unwashed clothing, even several days later. Avoidance of the plant in the future is the best measure. Poison ivy plant can be recognized by the number of leaves. Generally, poison ivy has three leaves with flowering branches on a single stem. Diphenhydramine may be purchased over the counter and used as needed for itching. Do not drive with this medication if it makes you drowsy.Ask your caregiver about medication for children. SEEK MEDICAL CARE IF:  Open sores develop.  Redness spreads beyond area of rash.  You notice purulent (pus-like) discharge.  You have increased pain.  Other signs of infection develop (such as fever).   This information is not intended to replace advice given to you by your health care provider. Make sure you discuss any questions you have with your health care provider.   Document Released: 02/09/2000 Document Revised: 05/06/2011 Document Reviewed: 07/20/2014 Elsevier Interactive Patient Education [employer].

## 2015-10-13 NOTE — Assessment & Plan Note (Signed)
Symptoms and exam consistent with poison ivy dermatitis that has been refractory to current prednisone treatment. Increase prednisone dosage and taper. Start triamcinolone cream for smaller areas. Follow up if symptoms worsen or do not improve.
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
## 2015-10-24 ENCOUNTER — Other Ambulatory Visit: Payer: Medicare Other | Admitting: *Deleted

## 2015-10-24 ENCOUNTER — Telehealth: Payer: Self-pay | Admitting: Cardiology

## 2015-10-24 DIAGNOSIS — Z0189 Encounter for other specified special examinations: Secondary | ICD-10-CM

## 2015-10-24 DIAGNOSIS — R5383 Other fatigue: Secondary | ICD-10-CM | POA: Diagnosis not present

## 2015-10-24 DIAGNOSIS — M810 Age-related osteoporosis without current pathological fracture: Secondary | ICD-10-CM

## 2015-10-24 DIAGNOSIS — R079 Chest pain, unspecified: Secondary | ICD-10-CM

## 2015-10-24 DIAGNOSIS — R42 Dizziness and giddiness: Secondary | ICD-10-CM | POA: Diagnosis not present

## 2015-10-24 DIAGNOSIS — Z0000 Encounter for general adult medical examination without abnormal findings: Secondary | ICD-10-CM | POA: Diagnosis not present

## 2015-10-24 LAB — TSH: TSH: 0.41 mIU/L

## 2015-10-24 NOTE — Telephone Encounter (Signed)
Left a message for the pt to call back.  

## 2015-10-24 NOTE — Telephone Encounter (Signed)
Informed the pt that the reason why her TSH and Vitamin D level, that was to be drawn on 8/14 is not crossing over to [place], for it appears it was never collected.  Informed the pt that unfortunately, we will need to collect these labs again, with no charge to her account, for this was our fault.  Deeply apologized to the pt about this.  Pt would like to come in today to have her TSH and Vit D level redrawn, as ordered by Estella Husk PA-C on 8/14 OV.  Informed the pt that would be fine and she can come in today to have this redrawn.  Will send our billing dept a message to not charge this pt, for this was a miss on our part, by our lab tech.  Pt verbalized understanding, agrees with this plan, and gracious for all the assistance provided. Will send this message to Estella Husk PA-C, to inform her of missed labs, and pt coming in today to have this redrawn.

## 2015-10-24 NOTE — Telephone Encounter (Signed)
New Message  Pt call requesting to speak with RN about test results for the TSH and vitamin D that did not show up on her my chart account. Please call back to discuss

## 2015-10-25 ENCOUNTER — Telehealth: Payer: Self-pay | Admitting: Cardiology

## 2015-10-25 LAB — VITAMIN D 25 HYDROXY (VIT D DEFICIENCY, FRACTURES): Vit D, 25-Hydroxy: 20 ng/mL — ABNORMAL LOW (ref 30–100)

## 2015-10-25 NOTE — Telephone Encounter (Signed)
Follow Up:      Returning your call from today. 

## 2015-10-26 NOTE — Telephone Encounter (Signed)
Returned pts call and discussed TSH & VIT D results  Pt verbalized understanding.

## 2015-10-31 ENCOUNTER — Telehealth: Payer: Self-pay

## 2015-10-31 ENCOUNTER — Telehealth: Payer: Self-pay | Admitting: Internal Medicine

## 2015-10-31 NOTE — Telephone Encounter (Signed)
Patient returned call, advised her that Dr. [name] states she needs to take the OTC vitamin 2000 units daily and her TSH was normal.

## 2015-10-31 NOTE — Telephone Encounter (Signed)
Please call cell.  Patient states that she spoke to you about her vit D and tyroid meds. She is looking for further clarification on vit D and also a prescription for thyroid meds. Please give her a call

## 2015-10-31 NOTE — Telephone Encounter (Signed)
Spoke to patient husband, told him to have patient give [place] a call back.

## 2016-01-01 DIAGNOSIS — L719 Rosacea, unspecified: Secondary | ICD-10-CM | POA: Diagnosis not present

## 2016-01-17 ENCOUNTER — Encounter: Payer: Self-pay | Admitting: Gastroenterology

## 2016-02-08 DIAGNOSIS — R1900 Intra-abdominal and pelvic swelling, mass and lump, unspecified site: Secondary | ICD-10-CM | POA: Diagnosis not present

## 2016-02-08 DIAGNOSIS — K5900 Constipation, unspecified: Secondary | ICD-10-CM | POA: Diagnosis not present

## 2016-02-28 ENCOUNTER — Encounter: Payer: Self-pay | Admitting: Gastroenterology

## 2016-04-02 ENCOUNTER — Ambulatory Visit (INDEPENDENT_AMBULATORY_CARE_PROVIDER_SITE_OTHER): Payer: Medicare Other

## 2016-04-02 DIAGNOSIS — Z23 Encounter for immunization: Secondary | ICD-10-CM

## 2016-04-15 ENCOUNTER — Ambulatory Visit (AMBULATORY_SURGERY_CENTER): Payer: Self-pay | Admitting: *Deleted

## 2016-04-15 VITALS — Ht 63.0 in | Wt 122.4 lb

## 2016-04-15 DIAGNOSIS — Z800 Family history of malignant neoplasm of digestive organs: Secondary | ICD-10-CM

## 2016-04-15 MED ORDER — NA SULFATE-K SULFATE-MG SULF 17.5-3.13-1.6 GM/177ML PO SOLN: 1.0000 | Freq: Once | ORAL | 0 refills | Status: AC

## 2016-04-16 DIAGNOSIS — Z113 Encounter for screening for infections with a predominantly sexual mode of transmission: Secondary | ICD-10-CM | POA: Diagnosis not present

## 2016-04-16 DIAGNOSIS — Z124 Encounter for screening for malignant neoplasm of cervix: Secondary | ICD-10-CM | POA: Diagnosis not present

## 2016-04-16 DIAGNOSIS — Z01419 Encounter for gynecological examination (general) (routine) without abnormal findings: Secondary | ICD-10-CM | POA: Diagnosis not present

## 2016-04-17 ENCOUNTER — Other Ambulatory Visit: Payer: Self-pay | Admitting: Obstetrics

## 2016-04-17 DIAGNOSIS — M810 Age-related osteoporosis without current pathological fracture: Secondary | ICD-10-CM

## 2016-04-17 DIAGNOSIS — Z1231 Encounter for screening mammogram for malignant neoplasm of breast: Secondary | ICD-10-CM

## 2016-04-19 DIAGNOSIS — Z1231 Encounter for screening mammogram for malignant neoplasm of breast: Secondary | ICD-10-CM | POA: Diagnosis not present

## 2016-04-22 ENCOUNTER — Ambulatory Visit
Admission: RE | Admit: 2016-04-22 | Discharge: 2016-04-22 | Disposition: A | Discharge status: Home / Self Care | Payer: Medicare Other | Source: Ambulatory Visit | Attending: Obstetrics | Admitting: Obstetrics

## 2016-04-22 DIAGNOSIS — M810 Age-related osteoporosis without current pathological fracture: Secondary | ICD-10-CM | POA: Diagnosis not present

## 2016-04-22 DIAGNOSIS — Z780 Asymptomatic menopausal state: Secondary | ICD-10-CM | POA: Diagnosis not present

## 2016-04-29 ENCOUNTER — Encounter: Payer: Self-pay | Admitting: Gastroenterology

## 2016-04-29 ENCOUNTER — Ambulatory Visit (AMBULATORY_SURGERY_CENTER): Payer: Medicare Other | Admitting: Gastroenterology

## 2016-04-29 VITALS — BP 117/69 | HR 64 | Temp 98.4°F | Resp 17 | Ht 63.0 in | Wt 119.0 lb

## 2016-04-29 DIAGNOSIS — Z1212 Encounter for screening for malignant neoplasm of rectum: Secondary | ICD-10-CM

## 2016-04-29 DIAGNOSIS — Z1211 Encounter for screening for malignant neoplasm of colon: Secondary | ICD-10-CM | POA: Diagnosis not present

## 2016-04-29 DIAGNOSIS — I10 Essential (primary) hypertension: Secondary | ICD-10-CM | POA: Diagnosis not present

## 2016-04-29 DIAGNOSIS — Z800 Family history of malignant neoplasm of digestive organs: Secondary | ICD-10-CM

## 2016-04-29 DIAGNOSIS — I2510 Atherosclerotic heart disease of native coronary artery without angina pectoris: Secondary | ICD-10-CM | POA: Diagnosis not present

## 2016-04-29 MED ORDER — SODIUM CHLORIDE 0.9 % IV SOLN: 500.0000 mL | INTRAVENOUS | Status: DC

## 2016-04-29 NOTE — Patient Instructions (Signed)
YOU HAD AN ENDOSCOPIC PROCEDURE TODAY AT THE [facility]:   Refer to the procedure report that was given to you for any specific questions about what was found during the examination.  If the procedure report does not answer your questions, please call your gastroenterologist to clarify.  If you requested that your care partner not be given the details of your procedure findings, then the procedure report has been included in a sealed envelope for you to review at your convenience later.  YOU SHOULD EXPECT: Some feelings of bloating in the abdomen. Passage of more gas than usual.  Walking can help get rid of the air that was put into your GI tract during the procedure and reduce the bloating. If you had a lower endoscopy (such as a colonoscopy or flexible sigmoidoscopy) you may notice spotting of blood in your stool or on the toilet paper. If you underwent a bowel prep for your procedure, you may not have a normal bowel movement for a few days.  Please Note:  You might notice some irritation and congestion in your nose or some drainage.  This is from the oxygen used during your procedure.  There is no need for concern and it should clear up in a day or so.  SYMPTOMS TO REPORT IMMEDIATELY:   Following lower endoscopy (colonoscopy or flexible sigmoidoscopy):  Excessive amounts of blood in the stool  Significant tenderness or worsening of abdominal pains  Swelling of the abdomen that is new, acute  Fever of 100F or higher  For urgent or emergent issues, a gastroenterologist can be reached at any hour by calling [phone].   DIET:  We do recommend a small meal at first, but then you may proceed to your regular diet.  Drink plenty of fluids but you should avoid alcoholic beverages for 24 hours.  ACTIVITY:  You should plan to take it easy for the rest of today and you should NOT DRIVE or use heavy machinery until tomorrow (because of the sedation medicines used during the test).     FOLLOW UP: Our staff will call the number listed on your records the next business day following your procedure to check on you and address any questions or concerns that you may have regarding the information given to you following your procedure. If we do not reach you, we will leave a message.  However, if you are feeling well and you are not experiencing any problems, there is no need to return our call.  We will assume that you have returned to your regular daily activities without incident.  If any biopsies were taken you will be contacted by phone or by letter within the next 1-3 weeks.  Please call us at [phone] if you have not heard about the biopsies in 3 weeks.   Repeat Colonoscopy in 5 years   SIGNATURES/CONFIDENTIALITY: You and/or your care partner have signed paperwork which will be entered into your electronic medical record.  These signatures attest to the fact that that the information above on your After Visit Summary has been reviewed and is understood.  Full responsibility of the confidentiality of this discharge information lies with you and/or your care-partner. 

## 2016-04-29 NOTE — Op Note (Signed)
[facility] Patient Name: Bethany Carter Procedure Date: 04/29/2016 8:53 AM MRN: [id number] Endoscopist: Milus Banister , MD Age: 66 Referring MD:  Date of Birth: [birth year] Gender: Female Account #: [id number] Procedure:                Colonoscopy Indications:              Screening in patient at increased risk: Family                            history of 1st-degree relative with colorectal                            cancer: FH of colon cancer (father had colon                            cancer): colonoscopy with Dr. Theola Sequin, it was                            normal, repeat was recommended in 5 years from that                            time due to family history of colon cancer Medicines:                Monitored Anesthesia Care Procedure:                Pre-Anesthesia Assessment:                           - Prior to the procedure, a History and Physical                            was performed, and patient medications and                            allergies were reviewed. The patient's tolerance of                            previous anesthesia was also reviewed. The risks                            and benefits of the procedure and the sedation                            options and risks were discussed with the patient.                            All questions were answered, and informed consent                            was obtained. Prior Anticoagulants: The patient has                            taken no previous anticoagulant or antiplatelet  agents. ASA Grade Assessment: II - A patient with                            mild systemic disease. After reviewing the risks                            and benefits, the patient was deemed in                            satisfactory condition to undergo the procedure.                           After obtaining informed consent, the colonoscope                            was passed under direct  vision. Throughout the                            procedure, the patient's blood pressure, pulse, and                            oxygen saturations were monitored continuously. The                            Model PCF-[id number]) scope was introduced                            through the anus and advanced to the the cecum,                            identified by appendiceal orifice and ileocecal                            valve. The colonoscopy was performed without                            difficulty. The patient tolerated the procedure                            well. The quality of the bowel preparation was                            excellent. The ileocecal valve, appendiceal                            orifice, and rectum were photographed. Scope In: 8:56:50 AM Scope Out: 9:10:35 AM Scope Withdrawal Time: 0 hours 7 minutes 50 seconds  Total Procedure Duration: 0 hours 13 minutes 45 seconds  Findings:                 The entire examined colon appeared normal on direct                            and retroflexion views. Complications:  No immediate complications. Estimated blood loss:                            None. Estimated Blood Loss:     Estimated blood loss: none. Impression:               - The entire examined colon is normal on direct and                            retroflexion views.                           - No polyps or cancers. Recommendation:           - Patient has a contact number available for                            emergencies. The signs and symptoms of potential                            delayed complications were discussed with the                            patient. Return to normal activities tomorrow.                            Written discharge instructions were provided to the                            patient.                           - Resume previous diet.                           - Continue present medications.                            - Repeat colonoscopy in 5 years for screening                            purposes. Milus Banister, MD 04/29/2016 9:13:24 AM This report has been signed electronically.

## 2016-04-29 NOTE — Progress Notes (Signed)
Report to PACU, RN, vss, BBS= Clear.  

## 2016-04-30 ENCOUNTER — Telehealth: Payer: Self-pay | Admitting: *Deleted

## 2016-04-30 NOTE — Telephone Encounter (Signed)
  Follow up Call-  Call back number 04/29/2016  Post procedure Call Back phone  # [phone]  Permission to leave phone message Yes  Some recent data might be hidden     Patient questions:  Do you have a fever, pain , or abdominal swelling? No. Pain Score  0 *  Have you tolerated food without any problems? Yes.    Have you been able to return to your normal activities? Yes.    Do you have any questions about your discharge instructions: Diet   No. Medications  No. Follow up visit  No.  Do you have questions or concerns about your Care? No.  Actions: * If pain score is 4 or above: No action needed, pain <4.

## 2016-05-16 DIAGNOSIS — M810 Age-related osteoporosis without current pathological fracture: Secondary | ICD-10-CM | POA: Diagnosis not present

## 2016-05-31 ENCOUNTER — Ambulatory Visit (INDEPENDENT_AMBULATORY_CARE_PROVIDER_SITE_OTHER): Payer: Medicare Other | Admitting: Internal Medicine

## 2016-05-31 ENCOUNTER — Encounter: Payer: Self-pay | Admitting: Internal Medicine

## 2016-05-31 VITALS — BP 106/80 | HR 74 | Temp 98.4°F | Ht 63.5 in | Wt 125.0 lb

## 2016-05-31 DIAGNOSIS — E538 Deficiency of other specified B group vitamins: Secondary | ICD-10-CM

## 2016-05-31 DIAGNOSIS — R5383 Other fatigue: Secondary | ICD-10-CM

## 2016-05-31 DIAGNOSIS — I201 Angina pectoris with documented spasm: Secondary | ICD-10-CM | POA: Diagnosis not present

## 2016-05-31 DIAGNOSIS — M810 Age-related osteoporosis without current pathological fracture: Secondary | ICD-10-CM | POA: Diagnosis not present

## 2016-05-31 NOTE — Patient Instructions (Addendum)

## 2016-05-31 NOTE — Progress Notes (Signed)
Pre visit review using our clinic review tool, if applicable. No additional management support is needed unless otherwise documented below in the visit note. 
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
## 2016-06-02 NOTE — Assessment & Plan Note (Signed)
Also for PTH level

## 2016-06-02 NOTE — Assessment & Plan Note (Signed)
Etiology unclear, for labs as ordered, Etiology unclear, Exam otherwise benign, to check labs as documented, follow with expectant management

## 2016-06-02 NOTE — Assessment & Plan Note (Signed)
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

## 2016-06-02 NOTE — Assessment & Plan Note (Signed)
Stable,no CP, cont same tx

## 2016-06-03 ENCOUNTER — Other Ambulatory Visit (INDEPENDENT_AMBULATORY_CARE_PROVIDER_SITE_OTHER): Payer: Medicare Other

## 2016-06-03 DIAGNOSIS — I201 Angina pectoris with documented spasm: Secondary | ICD-10-CM

## 2016-06-03 DIAGNOSIS — R5383 Other fatigue: Secondary | ICD-10-CM | POA: Diagnosis not present

## 2016-06-03 DIAGNOSIS — E538 Deficiency of other specified B group vitamins: Secondary | ICD-10-CM | POA: Diagnosis not present

## 2016-06-03 DIAGNOSIS — M810 Age-related osteoporosis without current pathological fracture: Secondary | ICD-10-CM

## 2016-06-03 LAB — CBC WITH DIFFERENTIAL/PLATELET
Basophils Absolute: 0 10*3/uL (ref 0.0–0.1)
Basophils Relative: 0.6 % (ref 0.0–3.0)
EOS ABS: 0.1 10*3/uL (ref 0.0–0.7)
EOS PCT: 2 % (ref 0.0–5.0)
HCT: 41.5 % (ref 36.0–46.0)
Hemoglobin: 13.9 g/dL (ref 12.0–15.0)
LYMPHS PCT: 35.8 % (ref 12.0–46.0)
Lymphs Abs: 1.4 10*3/uL (ref 0.7–4.0)
MCHC: 33.5 g/dL (ref 30.0–36.0)
MCV: 87.7 fl (ref 78.0–100.0)
MONO ABS: 0.4 10*3/uL (ref 0.1–1.0)
MONOS PCT: 10.2 % (ref 3.0–12.0)
NEUTROS PCT: 51.4 % (ref 43.0–77.0)
Neutro Abs: 2 10*3/uL (ref 1.4–7.7)
PLATELETS: 225 10*3/uL (ref 150.0–400.0)
RBC: 4.74 Mil/uL (ref 3.87–5.11)
RDW: 13.4 % (ref 11.5–15.5)
WBC: 3.9 10*3/uL — AB (ref 4.0–10.5)

## 2016-06-03 LAB — BASIC METABOLIC PANEL
BUN: 14 mg/dL (ref 6–23)
CALCIUM: 10.7 mg/dL — AB (ref 8.4–10.5)
CO2: 30 mEq/L (ref 19–32)
CREATININE: 0.71 mg/dL (ref 0.40–1.20)
Chloride: 106 mEq/L (ref 96–112)
GFR: 87.59 mL/min (ref >60.00)
GLUCOSE: 91 mg/dL (ref 70–99)
Potassium: 4.3 mEq/L (ref 3.5–5.1)
SODIUM: 142 meq/L (ref 135–145)

## 2016-06-03 LAB — URINALYSIS, ROUTINE W REFLEX MICROSCOPIC
Bilirubin Urine: NEGATIVE
Hgb urine dipstick: NEGATIVE
Ketones, ur: NEGATIVE
Nitrite: NEGATIVE
PH: 7.5 (ref 5.0–8.0)
SPECIFIC GRAVITY, URINE: 1.015 (ref 1.000–1.030)
TOTAL PROTEIN, URINE-UPE24: NEGATIVE
URINE GLUCOSE: NEGATIVE
Urobilinogen, UA: 0.2 (ref 0.0–1.0)

## 2016-06-03 LAB — LIPID PANEL
CHOLESTEROL: 155 mg/dL (ref 0–200)
HDL: 58.6 mg/dL (ref >39.00)
LDL CALC: 83 mg/dL (ref 0–99)
NonHDL: 96.54
Total CHOL/HDL Ratio: 3
Triglycerides: 66 mg/dL (ref 0.0–149.0)
VLDL: 13.2 mg/dL (ref 0.0–40.0)

## 2016-06-03 LAB — HEPATIC FUNCTION PANEL
ALBUMIN: 4.1 g/dL (ref 3.5–5.2)
ALK PHOS: 84 U/L (ref 39–117)
ALT: 17 U/L (ref 0–35)
AST: 21 U/L (ref 0–37)
Bilirubin, Direct: 0.1 mg/dL (ref 0.0–0.3)
Total Bilirubin: 0.6 mg/dL (ref 0.2–1.2)
Total Protein: 6.6 g/dL (ref 6.0–8.3)

## 2016-06-03 LAB — VITAMIN B12: Vitamin B-12: 213 pg/mL (ref 211–911)

## 2016-06-03 LAB — TSH: TSH: 1.52 u[IU]/mL (ref 0.35–4.50)

## 2016-06-03 LAB — VITAMIN D 25 HYDROXY (VIT D DEFICIENCY, FRACTURES): VITD: 49.48 ng/mL (ref 30.00–100.00)

## 2016-06-04 ENCOUNTER — Other Ambulatory Visit: Payer: Self-pay | Admitting: Internal Medicine

## 2016-06-04 ENCOUNTER — Telehealth: Payer: Self-pay

## 2016-06-04 DIAGNOSIS — E213 Hyperparathyroidism, unspecified: Secondary | ICD-10-CM

## 2016-06-04 LAB — PTH, INTACT AND CALCIUM
CALCIUM: 10.3 mg/dL (ref 8.6–10.4)
PTH: 95 pg/mL — ABNORMAL HIGH (ref 14–64)

## 2016-06-04 NOTE — Telephone Encounter (Signed)
-----   Message from Biagio Borg, MD sent at 06/04/2016  1:31 PM EDT ----- Left message on MyChart, pt to cont same tx except  Your PTH level is elevated with a normal Vitamin D level.  This could represent Hyperparathyroidism.  We will need to refer you to Endocrinology for further consideration. You should hear from the office about this again.   Efton Thomley to please inform pt, I will do referral

## 2016-06-04 NOTE — Telephone Encounter (Signed)
Called pt, LVM.   

## 2016-06-06 NOTE — Telephone Encounter (Signed)
This result is a trivial abnormality that has no clinical significance.  No further evaluation or treatment is needed

## 2016-06-06 NOTE — Telephone Encounter (Signed)
Shirron do you mind following up in regard b.c i'm sure patient will have additional questions.

## 2016-06-06 NOTE — Telephone Encounter (Signed)
Patient has called back.  Did notify that referral for endo has been entered.  Told patient to follow back up with our office if she does not hear anything within two weeks from endo.   Patient also states she seen that her WBC and B12 levels were off some.  She would like to know what Dr. [name] thinks is causing this or if there is to be concern.

## 2016-06-06 NOTE — Telephone Encounter (Signed)
Called pt and let her know the information about B-12. However she would like to know if it was any particular reason that her WBCs are low? Please advise.

## 2016-06-06 NOTE — Telephone Encounter (Signed)
Pt is correct, the B12 is very low normal.  Please start OTC b12 vitamin (or b complex multivitamin) every day for at least 3-6 months

## 2016-06-07 ENCOUNTER — Encounter: Payer: Self-pay | Admitting: Cardiology

## 2016-06-07 NOTE — Telephone Encounter (Signed)
Called pt, left detailed msg. 

## 2016-06-25 ENCOUNTER — Ambulatory Visit (INDEPENDENT_AMBULATORY_CARE_PROVIDER_SITE_OTHER): Payer: Medicare Other | Admitting: Endocrinology

## 2016-06-25 ENCOUNTER — Encounter: Payer: Self-pay | Admitting: Endocrinology

## 2016-06-25 DIAGNOSIS — I201 Angina pectoris with documented spasm: Secondary | ICD-10-CM

## 2016-06-25 MED ORDER — FUROSEMIDE 20 MG PO TABS: 20.0000 mg | ORAL_TABLET | Freq: Every day | ORAL | 5 refills | Status: DC | PRN
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
## 2016-06-25 NOTE — Patient Instructions (Addendum)
It is critically important to prevent falling down (keep floor areas well-lit, dry, and free of loose objects.  If you have a cane, walker, or wheelchair, you should use it, even for short trips around the house.  Wear flat-soled shoes.  Also, try not to rush). Please change the furosemide to a different type of fluid pill.  I have sent a prescription to your pharmacy. Our strategy will be to see if we can improve both the calciuim with changing the fluid pill, and the parathyroid level by keeping the vitamin-D in a good range.  Please come back for a follow-up appointment in 2-3 months.

## 2016-06-27 ENCOUNTER — Ambulatory Visit (INDEPENDENT_AMBULATORY_CARE_PROVIDER_SITE_OTHER): Payer: Medicare Other | Admitting: Cardiology

## 2016-06-27 VITALS — BP 116/64 | HR 76 | Ht 62.5 in | Wt 119.0 lb

## 2016-06-27 DIAGNOSIS — R42 Dizziness and giddiness: Secondary | ICD-10-CM

## 2016-06-27 DIAGNOSIS — I201 Angina pectoris with documented spasm: Secondary | ICD-10-CM

## 2016-06-27 DIAGNOSIS — I2510 Atherosclerotic heart disease of native coronary artery without angina pectoris: Secondary | ICD-10-CM | POA: Diagnosis not present

## 2016-06-27 DIAGNOSIS — E784 Other hyperlipidemia: Secondary | ICD-10-CM | POA: Diagnosis not present

## 2016-06-27 DIAGNOSIS — E7849 Other hyperlipidemia: Secondary | ICD-10-CM

## 2016-06-27 DIAGNOSIS — R600 Localized edema: Secondary | ICD-10-CM | POA: Diagnosis not present

## 2016-06-27 NOTE — Patient Instructions (Signed)
Medication Instructions:   Your physician recommends that you continue on your current medications as directed. Please refer to the Current Medication list given to you today.    Testing/Procedures:  Your physician has requested that you have an echocardiogram. Echocardiography is a painless test that uses sound waves to create images of your heart. It provides your doctor with information about the size and shape of your heart and how well your heart's chambers and valves are working. This procedure takes approximately one hour. There are no restrictions for this procedure.     Follow-Up:  Your physician wants you to follow-up in: ONE YEAR WITH DR NELSON You will receive a reminder letter in the mail two months in advance. If you don't receive a letter, please call our office to schedule the follow-up appointment.        If you need a refill on your cardiac medications before your next appointment, please call your pharmacy.   
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
## 2016-06-28 ENCOUNTER — Ambulatory Visit (INDEPENDENT_AMBULATORY_CARE_PROVIDER_SITE_OTHER): Payer: Medicare Other | Admitting: Internal Medicine

## 2016-06-28 ENCOUNTER — Encounter: Payer: Self-pay | Admitting: Internal Medicine

## 2016-06-28 VITALS — BP 106/58 | HR 72 | Temp 98.2°F | Ht 62.5 in | Wt 121.0 lb

## 2016-06-28 DIAGNOSIS — L237 Allergic contact dermatitis due to plants, except food: Secondary | ICD-10-CM | POA: Diagnosis not present

## 2016-06-28 DIAGNOSIS — I201 Angina pectoris with documented spasm: Secondary | ICD-10-CM

## 2016-06-28 MED ORDER — PREDNISONE 10 MG PO TABS: ORAL_TABLET | ORAL | 0 refills | Status: DC

## 2016-06-28 MED ORDER — METHYLPREDNISOLONE ACETATE 40 MG/ML IJ SUSP
40.0000 mg | Freq: Once | INTRAMUSCULAR | Status: AC
  Administered 2016-06-28: 40 mg via INTRAMUSCULAR
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
## 2016-06-28 NOTE — Progress Notes (Signed)
Pre visit review using our clinic review tool, if applicable. No additional management support is needed unless otherwise documented below in the visit note. 

## 2016-06-28 NOTE — Assessment & Plan Note (Signed)
Depo-medrol 40 mg IM x 1 Prednisone taper Taking zyrtec daily for allergies Can apply calamine or benadryl lotion Call if no improvement She has had both steroid injections and orally in the past year and had no side effects - reviewed possible side effects

## 2016-06-28 NOTE — Patient Instructions (Signed)
Your received a steroid injection today.  Tomorrow start the steroid pills and take as directed.   You can apply calamine lotion or benadryl cream to the rash.   Call if no improvement     Poison Ivy Dermatitis Poison ivy dermatitis is inflammation of the skin that is caused by the allergens on the leaves of the poison ivy plant. The skin reaction often involves redness, swelling, blisters, and extreme itching. What are the causes? This condition is caused by a specific chemical (urushiol) found in the sap of the poison ivy plant. This chemical is sticky and can be easily spread to people, animals, and objects. You can get poison ivy dermatitis by:  Having direct contact with a poison ivy plant.  Touching animals, other people, or objects that have come in contact with poison ivy and have the chemical on them. What increases the risk? This condition is more likely to develop in:  People who are outdoors often.  People who go outdoors without wearing protective clothing, such as closed shoes, long pants, and a long-sleeved shirt. What are the signs or symptoms? Symptoms of this condition include:  Redness and itching.  A rash that often includes bumps and blisters. The rash usually appears 48 hours after exposure.  Swelling. This may occur if the reaction is more severe. Symptoms usually last for 1-2 weeks. However, the first time you develop this condition, symptoms may last 3-4 weeks. How is this diagnosed? This condition may be diagnosed based on your symptoms and a physical exam. Your health care provider may also ask you about any recent outdoor activity. How is this treated? Treatment for this condition will vary depending on how severe it is. Treatment may include:  Hydrocortisone creams or calamine lotions to relieve itching.  Oatmeal baths to soothe the skin.  Over-the-counter antihistamine tablets.  Oral steroid medicine for more severe outbreaks. Follow these  instructions at home:  Take or apply over-the-counter and prescription medicines only as told by your health care provider.  Wash exposed skin as soon as possible with soap and cold water.  Use hydrocortisone creams or calamine lotion as needed to soothe the skin and relieve itching.  Take oatmeal baths as needed. Use colloidal oatmeal. You can get this at your local pharmacy or grocery store. Follow the instructions on the packaging.  Do not scratch or rub your skin.  While you have the rash, wash clothes right after you wear them. How is this prevented?  Learn to identify the poison ivy plant and avoid contact with the plant. This plant can be recognized by the number of leaves. Generally, poison ivy has three leaves with flowering branches on a single stem. The leaves are typically glossy, and they have jagged edges that come to a point at the front.  If you have been exposed to poison ivy, thoroughly wash with soap and water right away. You have about 30 minutes to remove the plant resin before it will cause the rash. Be sure to wash under your fingernails because any plant resin there will continue to spread the rash.  When hiking or camping, wear clothes that will help you to avoid exposure on the skin. This includes long pants, a long-sleeved shirt, tall socks, and hiking boots. You can also apply preventive lotion to your skin to help limit exposure.  If you suspect that your clothes or outdoor gear came in contact with poison ivy, rinse them off outside with a garden hose before you  bring them inside your house. Contact a health care provider if:  You have open sores in the rash area.  You have more redness, swelling, or pain in the affected area.  You have redness that spreads beyond the rash area.  You have fluid, blood, or pus coming from the affected area.  You have a fever.  You have a rash over a large area of your body.  You have a rash on your eyes, mouth, or  genitals.  Your rash does not improve after a few days. Get help right away if:  Your face swells or your eyes swell shut.  You have trouble breathing.  You have trouble swallowing. This information is not intended to replace advice given to you by your health care provider. Make sure you discuss any questions you have with your health care provider. Document Released: 02/09/2000 Document Revised: 07/20/2015 Document Reviewed: 07/20/2014 Elsevier Interactive Patient Education  2017 [employer].

## 2016-07-08 ENCOUNTER — Ambulatory Visit (HOSPITAL_COMMUNITY): Payer: Medicare Other | Attending: Cardiology

## 2016-07-08 DIAGNOSIS — R600 Localized edema: Secondary | ICD-10-CM | POA: Insufficient documentation

## 2016-07-09 ENCOUNTER — Telehealth: Payer: Self-pay | Admitting: *Deleted

## 2016-07-09 MED ORDER — NITROGLYCERIN 0.4 MG SL SUBL: 0.4000 mg | SUBLINGUAL_TABLET | SUBLINGUAL | 2 refills | Status: DC | PRN

## 2016-07-09 MED ORDER — FUROSEMIDE 20 MG PO TABS: ORAL_TABLET | ORAL | 3 refills | Status: DC

## 2016-07-09 NOTE — Telephone Encounter (Signed)
-----   Message from Dorothy Spark, MD sent at 07/09/2016  8:44 AM EDT ----- Echo shows normal LVEF and grade 1 diastolic dysfunction - appropriate for age, however can be causing leg swelling, please advise her to take lasix 20 mg po every day, when her swelling resolves than switch it to every other day. (Currently PRN)

## 2016-07-09 NOTE — Telephone Encounter (Signed)
Notified the pt that per Dr Meda Coffee, her echo showed normal LVEF, grade 1 diastolic dysfunction, which is age appropriate, however can be causing leg swelling, so she recommends that we change her lasix to 20 mg po daily, and when her swelling resolves, than switch it to taking it every other day thereafter.  Pt also requested refill of Nitro, for her current supply has expired. Confirmed the pharmacy of choice with the pt.  Pt verbalized understanding and agrees with this plan.

## 2016-10-07 ENCOUNTER — Ambulatory Visit (INDEPENDENT_AMBULATORY_CARE_PROVIDER_SITE_OTHER): Payer: Medicare Other | Admitting: Endocrinology

## 2016-10-07 ENCOUNTER — Encounter: Payer: Self-pay | Admitting: Endocrinology

## 2016-10-07 DIAGNOSIS — I201 Angina pectoris with documented spasm: Secondary | ICD-10-CM
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
## 2016-10-07 NOTE — Patient Instructions (Addendum)
blood tests are requested for you today.  We'll let you know about the results. Our strategy will be to see if we can improve both the calciuim with changing the fluid pill, and the parathyroid level by keeping the vitamin-D in a good range.  Please come back for a follow-up appointment in 1 year.

## 2016-10-08 LAB — BASIC METABOLIC PANEL
BUN: 19 mg/dL (ref 6–23)
CHLORIDE: 103 meq/L (ref 96–112)
CO2: 30 meq/L (ref 19–32)
Calcium: 10.8 mg/dL — ABNORMAL HIGH (ref 8.4–10.5)
Creatinine, Ser: 0.62 mg/dL (ref 0.40–1.20)
GFR: 102.31 mL/min (ref >60.00)
GLUCOSE: 90 mg/dL (ref 70–99)
POTASSIUM: 4.4 meq/L (ref 3.5–5.1)
SODIUM: 139 meq/L (ref 135–145)

## 2016-10-08 LAB — VITAMIN D 25 HYDROXY (VIT D DEFICIENCY, FRACTURES): VITD: 41.37 ng/mL (ref 30.00–100.00)

## 2016-10-10 LAB — PTH, INTACT AND CALCIUM
CALCIUM: 10.8 mg/dL — AB (ref 8.6–10.4)
PTH: 134 pg/mL — AB (ref 14–64)

## 2016-11-05 ENCOUNTER — Telehealth: Payer: Self-pay | Admitting: Endocrinology

## 2016-11-05 DIAGNOSIS — E210 Primary hyperparathyroidism: Secondary | ICD-10-CM

## 2016-11-05 NOTE — Telephone Encounter (Signed)
Patient called to provide the number for Endo to send records to Dr. Fredirick Maudlin. Call [phone] to advise.

## 2016-11-07 DIAGNOSIS — E210 Primary hyperparathyroidism: Secondary | ICD-10-CM | POA: Insufficient documentation

## 2016-11-07 NOTE — Telephone Encounter (Signed)
Called Dr. Glenford Peers office and patient hasn't been seen there. I was wondering if you had any idea what this was in regards to? I have called patient back again to try to clarify if she is wanting a referral?

## 2016-11-07 NOTE — Telephone Encounter (Signed)
Called patient to ask if she needed her whole medical record sent to doctor's office. Asked if she would call back to clarify.

## 2016-11-07 NOTE — Telephone Encounter (Signed)
Patient states she needs someone to call Dr. Glenford Peers office to coordinate what records are needed.

## 2016-11-07 NOTE — Telephone Encounter (Signed)
I entered referral in epic

## 2016-11-07 NOTE — Telephone Encounter (Signed)
Called patient and she stated that in mychart message Dr. Loanne Drilling had said that she may want to see a surgeon about her parathyroid. She was wanted to see Dr. Fredirick Maudlin at [facility]. She wants a referral put in?

## 2016-11-08 NOTE — Telephone Encounter (Signed)
Called patient and left VM that referral was placed.

## 2016-11-22 NOTE — Telephone Encounter (Signed)
Patient stated she called Dr. Glenford Peers office and the referral was not placed per their office. Call Dr. Glenford Peers office to ensure that they have a referral for the patient and call patient to notify once this is done.

## 2016-11-26 ENCOUNTER — Telehealth: Payer: Self-pay | Admitting: *Deleted

## 2016-11-26 NOTE — Telephone Encounter (Signed)
Patient called and is inquiring about a referral that Dr. Loanne Drilling put in for her. Patient is requesting a call back . Her contact [phone] . Please advise. Thank you

## 2016-11-27 NOTE — Telephone Encounter (Signed)
Please forward to P[facility] 

## 2016-11-27 NOTE — Telephone Encounter (Signed)
Per Neoma Laming the original referral did not indicate a patient preferred location so it was sent to the closest [facility] provider. She will re-route this referral based on the new information.   In the future, please indicate in the referral if a patient has a preferred location for services.   Thanks!

## 2016-11-27 NOTE — Telephone Encounter (Signed)
Patient called and she does not want to be referred to [facility] but to see Dr. Fredirick Maudlin at [facility]. I see where the referral was placed & approved just not to the place she wanted.

## 2016-11-28 NOTE — Telephone Encounter (Signed)
Ok thank you didn't realize that!

## 2016-12-03 ENCOUNTER — Telehealth: Payer: Self-pay

## 2016-12-03 ENCOUNTER — Ambulatory Visit (INDEPENDENT_AMBULATORY_CARE_PROVIDER_SITE_OTHER): Payer: Medicare Other

## 2016-12-03 DIAGNOSIS — Z23 Encounter for immunization: Secondary | ICD-10-CM | POA: Diagnosis not present

## 2016-12-03 NOTE — Telephone Encounter (Signed)
Per patient request, she states dr burns has been recommended highly to her and she was wanting to know if she could switch from dr [name] to dr burns---routing to dr burns, please advise and I will call patient back, thanks

## 2016-12-03 NOTE — Telephone Encounter (Signed)
I am sorry I am not accepting new patients at this time.

## 2016-12-04 ENCOUNTER — Telehealth: Payer: Self-pay | Admitting: Endocrinology

## 2016-12-04 NOTE — Telephone Encounter (Signed)
Patient is calling on the status of referral to see Dr Jodean Lima in [place].

## 2016-12-04 NOTE — Telephone Encounter (Signed)
Patient is ok with switching over to [facility]---routing to tammy/scheduling---can you please get patient switched over to [facility] and call her back to schedule appt ---patient is aware we will be calling her back to arrange---thanks

## 2016-12-05 NOTE — Telephone Encounter (Signed)
Please advise. Thanks.  

## 2016-12-05 NOTE — Telephone Encounter (Signed)
Left patient vm to call back to schedule next appt as transfer over to [facility].

## 2016-12-12 NOTE — Telephone Encounter (Signed)
Please refer to [facility]

## 2016-12-12 NOTE — Telephone Encounter (Signed)
Patient is asking you to give her a call concerning the referral to see Dr Anderson Malta, please advise  Fax [phone]   Patient ask you to please call her

## 2016-12-13 ENCOUNTER — Ambulatory Visit (INDEPENDENT_AMBULATORY_CARE_PROVIDER_SITE_OTHER): Payer: Medicare Other | Admitting: Nurse Practitioner

## 2016-12-13 ENCOUNTER — Encounter: Payer: Self-pay | Admitting: Nurse Practitioner

## 2016-12-13 VITALS — BP 122/74 | HR 62 | Temp 97.6°F | Ht 62.5 in | Wt 122.0 lb

## 2016-12-13 DIAGNOSIS — B029 Zoster without complications: Secondary | ICD-10-CM

## 2016-12-13 DIAGNOSIS — Z23 Encounter for immunization: Secondary | ICD-10-CM | POA: Diagnosis not present

## 2016-12-13 MED ORDER — VALACYCLOVIR HCL 1 G PO TABS: 1000.0000 mg | ORAL_TABLET | Freq: Three times a day (TID) | ORAL | 1 refills | Status: DC

## 2016-12-13 NOTE — Progress Notes (Signed)
Subjective:  Patient ID: Bethany Carter, female    DOB: [birth year]  Age: 66 y.o. MRN: [id number]  CC: Rash (chills,red spot on right jaw line. burning and painful at times. going on for 3 days. took acycloviar. )   Rash  This is a new problem. The current episode started in the past 7 days. The problem has been gradually worsening since onset. The affected locations include the neck (right side of neck). The rash is characterized by blistering, pain, itchiness and redness. It is unknown if there was an exposure to a precipitant. Pertinent negatives include no eye pain, facial edema, fatigue, fever or joint pain. Treatments tried: acyclovir. Her past medical history is significant for varicella.    Outpatient Medications Prior to Visit  Medication Sig Dispense Refill  . acetaminophen (TYLENOL) 500 MG tablet Take 500 mg by mouth every 8 (eight) hours as needed for moderate pain.    Marland Kitchen alendronate (FOSAMAX) 70 MG tablet Take 70 mg by mouth daily.    . cetirizine (ZYRTEC) 10 MG tablet Take 10 mg by mouth daily as needed for allergies.    . cholecalciferol (VITAMIN D) 1000 units tablet Take 2,000 Units by mouth daily.    Marland Kitchen doxycycline (VIBRAMYCIN) 100 MG capsule Take 100 mg by mouth as needed.    . furosemide (LASIX) 20 MG tablet Take 1 tab by mouth daily for swelling, then if swelling resolves, switch to taking 1 tab by mouth every other day, thereafter. 90 tablet 3  . Magnesium 100 MG CAPS Take 150 mg by mouth.    . nitroGLYCERIN (NITROSTAT) 0.4 MG SL tablet Place 1 tablet (0.4 mg total) under the tongue every 5 (five) minutes as needed for chest pain. 25 tablet 2  . COD LIVER OIL PO Take by mouth daily.    . predniSONE (DELTASONE) 10 MG tablet Take 3 tabs po with food daily x 3 days, then 2 tabs po qd x 3 days, then 1 tab po qd x 3 days (Patient not taking: Reported on 10/07/2016) 18 tablet 0   No facility-administered medications prior to visit.     ROS See HPI  Objective:  BP 122/74    Pulse 62   Temp 97.6 F (36.4 C)   Ht 5' 2.5" (1.588 m)   Wt 122 lb (55.3 kg)   SpO2 97%   BMI 21.96 kg/m   BP Readings from Last 3 Encounters:  12/13/16 122/74  10/07/16 118/68  06/28/16 (!) 106/58    Wt Readings from Last 3 Encounters:  12/13/16 122 lb (55.3 kg)  10/07/16 119 lb (54 kg)  06/28/16 121 lb (54.9 kg)    Physical Exam  Constitutional: She is oriented to person, place, and time. No distress.  Eyes: Pupils are equal, round, and reactive to light. Conjunctivae and EOM are normal.  Neck: Normal range of motion. Neck supple. No thyromegaly present.    Cardiovascular: Normal rate.   Pulmonary/Chest: Effort normal.  Lymphadenopathy:    She has no cervical adenopathy.  Neurological: She is alert and oriented to person, place, and time.  Skin: Skin is warm and dry. Rash noted. Rash is vesicular. There is erythema.  Vitals reviewed.   Lab Results  Component Value Date   WBC 3.9 (L) 06/03/2016   HGB 13.9 06/03/2016   HCT 41.5 06/03/2016   PLT 225.0 06/03/2016   GLUCOSE 90 10/07/2016   CHOL 155 06/03/2016   TRIG 66.0 06/03/2016   HDL 58.60 06/03/2016   LDLCALC 83  06/03/2016   ALT 17 06/03/2016   AST 21 06/03/2016   NA 139 10/07/2016   K 4.4 10/07/2016   CL 103 10/07/2016   CREATININE 0.62 10/07/2016   BUN 19 10/07/2016   CO2 30 10/07/2016   TSH 1.52 06/03/2016   INR 1.0 08/25/2013    Dg Bone Density (dxa)  Result Date: 04/22/2016 EXAM: DUAL X-RAY ABSORPTIOMETRY (DXA) FOR BONE MINERAL DENSITY IMPRESSION: Referring Physician:  Aloha Gell MD PATIENT: Name: Bethany Carter, Bethany Carter Patient ID: [id number] Birth Date: 04/26/1950 Height: 62.3 in. Sex: Female Measured: 04/22/2016 Weight: 125.6 lbs. Indications: Caucasian, Estrogen Deficient, Family Hist. (Parent hip fracture), Postmenopausal Fractures: None Treatments: Vitamin D (E933.5) ASSESSMENT: The BMD measured at Femur Total Right is 0.557 g/cm2 with a T-score of -3.6. This patient is considered OSTEOPOROTIC  according to [facility]) criteria. There has been a statistically significant decrease in BMD of Lumbar spine and the Left hip since prior exam dated 02/20/2012. Site Region Measured Date Measured Age YA BMD Significant CHANGE T-score DualFemur Total Right 04/22/2016    65.7         -3.6    0.557 g/cm2 AP Spine L1-L4 04/22/2016 65.7 -2.3 0.904 g/cm2 * World Health Organization [facility]) criteria for post-menopausal, Caucasian Women: Normal       T-score at or above -1 SD Osteopenia   T-score between -1 and -2.5 SD Osteoporosis T-score at or below -2.5 SD RECOMMENDATION: [place] recommends that FDA-approved medical therapies be considered in postmenopausal women and men age 57 or older with a: 1. Hip or vertebral (clinical or morphometric) fracture. 2. T-score of <-2.5 at the spine or hip. 3. Ten-year fracture probability by FRAX of 3% or greater for hip fracture or 20% or greater for major osteoporotic fracture. All treatment decisions require clinical judgment and consideration of individual patient factors, including patient preferences, co-morbidities, previous drug use, risk factors not captured in the FRAX model (e.g. falls, vitamin D deficiency, increased bone turnover, interval significant decline in bone density) and possible under - or over-estimation of fracture risk by FRAX. All patients should ensure an adequate intake of dietary calcium (1200 mg/d) and vitamin D (800 IU daily) unless contraindicated. FOLLOW-UP: People with diagnosed cases of osteoporosis or at high risk for fracture should have regular bone mineral density tests. For patients eligible for Medicare, routine testing is allowed once every 2 years. The testing frequency can be increased to one year for patients who have rapidly progressing disease, those who are receiving or discontinuing medical therapy to restore bone mass, or have additional risk factors. I have reviewed this report, and agree  with the above findings. Mark A. Thornton Papas, M.D. [facility] Electronically Signed   By: Lavonia Dana M.D.   On: 04/22/2016 15:58    Assessment & Plan:   Takeila was seen today for rash.  Diagnoses and all orders for this visit:  Herpes zoster without complication -     valACYclovir (VALTREX) 1000 MG tablet; Take 1 tablet (1,000 mg total) by mouth 3 (three) times daily.  Need for vaccination with 13-polyvalent pneumococcal conjugate vaccine -     Pneumococcal conjugate vaccine 13-valent IM   I am having Ms. Dambach start on valACYclovir. I am also having her maintain her acetaminophen, cetirizine, cholecalciferol, Magnesium, COD LIVER OIL PO, doxycycline, alendronate, predniSONE, furosemide, and nitroGLYCERIN.  Meds ordered this encounter  Medications  . valACYclovir (VALTREX) 1000 MG tablet    Sig: Take 1 tablet (1,000 mg total) by mouth  3 (three) times daily.    Dispense:  21 tablet    Refill:  1    Order Specific Question:   Supervising Provider    Answer:   Binnie Rail [[id number]]    Follow-up: No Follow-up on file.  Wilfred Lacy, NP

## 2016-12-13 NOTE — Patient Instructions (Signed)

## 2016-12-17 NOTE — Telephone Encounter (Signed)
Pt called and was upset that she had not heard anything regarding her referral and stated it had been a month.  She would like to be referred to general surgery.  Explained that we had referred to [facility].  Pt stated that she would like to be referred to:  Dr. Fredirick Maudlin [facility] med center  [phone]  [phone] fax #  Assured pt that the referral would be scheduled today.  Called, scheduled appointment for 12/26/16 arrival 1pm at the 4th floor [place] offices.  Called patient and informed of appointment date and time, she expressed understanding

## 2016-12-30 ENCOUNTER — Ambulatory Visit (INDEPENDENT_AMBULATORY_CARE_PROVIDER_SITE_OTHER): Payer: Medicare Other | Admitting: Nurse Practitioner

## 2016-12-30 ENCOUNTER — Encounter: Payer: Self-pay | Admitting: Nurse Practitioner

## 2016-12-30 VITALS — BP 120/78 | HR 71 | Temp 97.8°F | Ht 62.5 in | Wt 121.0 lb

## 2016-12-30 DIAGNOSIS — J209 Acute bronchitis, unspecified: Secondary | ICD-10-CM

## 2016-12-30 DIAGNOSIS — J0140 Acute pansinusitis, unspecified: Secondary | ICD-10-CM

## 2016-12-30 MED ORDER — SALINE SPRAY 0.65 % NA SOLN: 1.0000 | NASAL | 0 refills | Status: DC | PRN

## 2016-12-30 MED ORDER — HYDROCODONE-HOMATROPINE 5-1.5 MG/5ML PO SYRP: 5.0000 mL | ORAL_SOLUTION | Freq: Four times a day (QID) | ORAL | 0 refills | Status: DC | PRN

## 2016-12-30 MED ORDER — FLUTICASONE PROPIONATE 50 MCG/ACT NA SUSP: 2.0000 | Freq: Every day | NASAL | 0 refills | Status: DC

## 2016-12-30 MED ORDER — ALBUTEROL SULFATE HFA 108 (90 BASE) MCG/ACT IN AERS: 1.0000 | INHALATION_SPRAY | Freq: Four times a day (QID) | RESPIRATORY_TRACT | 0 refills | Status: DC | PRN

## 2016-12-30 MED ORDER — LEVOFLOXACIN 500 MG PO TABS: 500.0000 mg | ORAL_TABLET | Freq: Every day | ORAL | 0 refills | Status: DC

## 2016-12-30 NOTE — Progress Notes (Signed)
Subjective:  Patient ID: Bethany Carter, female    DOB: 12/04/1950  Age: 66 y.o. MRN: [id number]  CC: Cough (coughing yellow/green mucus, eyes drieness, pain area chest area from couging. 1 wk. grandson and daughter had pneumonia from Adenovirus/ had delsym and bensonatate didnt help)   Cough  This is a new problem. The current episode started in the past 7 days. The problem has been gradually worsening. The problem occurs constantly. The cough is productive of sputum and productive of purulent sputum. Associated symptoms include chest pain, chills, headaches, nasal congestion, postnasal drip, rhinorrhea, a sore throat and shortness of breath. Pertinent negatives include no fever. The symptoms are aggravated by lying down and cold air. She has tried OTC cough suppressant and prescription cough suppressant for the symptoms. The treatment provided no relief.    Outpatient Medications Prior to Visit  Medication Sig Dispense Refill  . acetaminophen (TYLENOL) 500 MG tablet Take 500 mg by mouth every 8 (eight) hours as needed for moderate pain.    Marland Kitchen alendronate (FOSAMAX) 70 MG tablet Take 70 mg by mouth daily.    . cetirizine (ZYRTEC) 10 MG tablet Take 10 mg by mouth daily as needed for allergies.    . cholecalciferol (VITAMIN D) 1000 units tablet Take 2,000 Units by mouth daily.    . COD LIVER OIL PO Take by mouth daily.    Marland Kitchen doxycycline (VIBRAMYCIN) 100 MG capsule Take 100 mg by mouth as needed.    . furosemide (LASIX) 20 MG tablet Take 1 tab by mouth daily for swelling, then if swelling resolves, switch to taking 1 tab by mouth every other day, thereafter. 90 tablet 3  . Magnesium 100 MG CAPS Take 150 mg by mouth.    . nitroGLYCERIN (NITROSTAT) 0.4 MG SL tablet Place 1 tablet (0.4 mg total) under the tongue every 5 (five) minutes as needed for chest pain. 25 tablet 2  . predniSONE (DELTASONE) 10 MG tablet Take 3 tabs po with food daily x 3 days, then 2 tabs po qd x 3 days, then 1 tab po qd x 3  days (Patient not taking: Reported on 10/07/2016) 18 tablet 0  . valACYclovir (VALTREX) 1000 MG tablet Take 1 tablet (1,000 mg total) by mouth 3 (three) times daily. (Patient not taking: Reported on 12/30/2016) 21 tablet 1   No facility-administered medications prior to visit.     ROS See HPI  Objective:  BP 120/78   Pulse 71   Temp 97.8 F (36.6 C)   Ht 5' 2.5" (1.588 m)   Wt 121 lb (54.9 kg)   SpO2 98%   BMI 21.78 kg/m   BP Readings from Last 3 Encounters:  12/30/16 120/78  12/13/16 122/74  10/07/16 118/68    Wt Readings from Last 3 Encounters:  12/30/16 121 lb (54.9 kg)  12/13/16 122 lb (55.3 kg)  10/07/16 119 lb (54 kg)    Physical Exam  Constitutional: She is oriented to person, place, and time. No distress.  HENT:  Right Ear: External ear and ear canal normal. A middle ear effusion is present.  Left Ear: External ear and ear canal normal. A middle ear effusion is present.  Nose: Mucosal edema and rhinorrhea present. Right sinus exhibits maxillary sinus tenderness. Right sinus exhibits no frontal sinus tenderness. Left sinus exhibits maxillary sinus tenderness. Left sinus exhibits no frontal sinus tenderness.  Mouth/Throat: Uvula is midline. No trismus in the jaw. Posterior oropharyngeal erythema present. No oropharyngeal exudate.  Eyes: No scleral  icterus.  Neck: Normal range of motion. Neck supple.  Cardiovascular: Normal rate and normal heart sounds.  Pulmonary/Chest: Effort normal and breath sounds normal.  Musculoskeletal: She exhibits no edema.  Lymphadenopathy:    She has no cervical adenopathy.  Neurological: She is alert and oriented to person, place, and time.  Vitals reviewed.   Lab Results  Component Value Date   WBC 3.9 (L) 06/03/2016   HGB 13.9 06/03/2016   HCT 41.5 06/03/2016   PLT 225.0 06/03/2016   GLUCOSE 90 10/07/2016   CHOL 155 06/03/2016   TRIG 66.0 06/03/2016   HDL 58.60 06/03/2016   LDLCALC 83 06/03/2016   ALT 17 06/03/2016   AST  21 06/03/2016   NA 139 10/07/2016   K 4.4 10/07/2016   CL 103 10/07/2016   CREATININE 0.62 10/07/2016   BUN 19 10/07/2016   CO2 30 10/07/2016   TSH 1.52 06/03/2016   INR 1.0 08/25/2013    Dg Bone Density (dxa)  Result Date: 04/22/2016 EXAM: DUAL X-RAY ABSORPTIOMETRY (DXA) FOR BONE MINERAL DENSITY IMPRESSION: Referring Physician:  Aloha Gell MD PATIENT: Name: CAROLANN, BRAZELL Patient ID: [id number] Birth Date: 11-Aug-1950 Height: 62.3 in. Sex: Female Measured: 04/22/2016 Weight: 125.6 lbs. Indications: Caucasian, Estrogen Deficient, Family Hist. (Parent hip fracture), Postmenopausal Fractures: None Treatments: Vitamin D (E933.5) ASSESSMENT: The BMD measured at Femur Total Right is 0.557 g/cm2 with a T-score of -3.6. This patient is considered OSTEOPOROTIC according to [facility]) criteria. There has been a statistically significant decrease in BMD of Lumbar spine and the Left hip since prior exam dated 02/20/2012. Site Region Measured Date Measured Age YA BMD Significant CHANGE T-score DualFemur Total Right 04/22/2016    66          -3.6    0.557 g/cm2 AP Spine L1-L4 04/22/2016 65.7 -2.3 0.904 g/cm2 * World Health Organization [facility]) criteria for post-menopausal, Caucasian Women: Normal       T-score at or above -1 SD Osteopenia   T-score between -1 and -2.5 SD Osteoporosis T-score at or below -2.5 SD RECOMMENDATION: [facility] recommends that FDA-approved medical therapies be considered in postmenopausal women and men age 44 or older with a: 1. Hip or vertebral (clinical or morphometric) fracture. 2. T-score of <-2.5 at the spine or hip. 3. Ten-year fracture probability by FRAX of 3% or greater for hip fracture or 20% or greater for major osteoporotic fracture. All treatment decisions require clinical judgment and consideration of individual patient factors, including patient preferences, co-morbidities, previous drug use, risk factors not captured in the  FRAX model (e.g. falls, vitamin D deficiency, increased bone turnover, interval significant decline in bone density) and possible under - or over-estimation of fracture risk by FRAX. All patients should ensure an adequate intake of dietary calcium (1200 mg/d) and vitamin D (800 IU daily) unless contraindicated. FOLLOW-UP: People with diagnosed cases of osteoporosis or at high risk for fracture should have regular bone mineral density tests. For patients eligible for Medicare, routine testing is allowed once every 2 years. The testing frequency can be increased to one year for patients who have rapidly progressing disease, those who are receiving or discontinuing medical therapy to restore bone mass, or have additional risk factors. I have reviewed this report, and agree with the above findings. Mark A. Thornton Papas, M.D. [facility] Electronically Signed   By: Lavonia Dana M.D.   On: 04/22/2016 15:58    Assessment & Plan:   Brennan was seen today for cough.  Diagnoses  and all orders for this visit:  Acute non-recurrent pansinusitis -     levofloxacin (LEVAQUIN) 500 MG tablet; Take 1 tablet (500 mg total) daily by mouth. -     fluticasone (FLONASE) 50 MCG/ACT nasal spray; Place 2 sprays daily into both nostrils. -     sodium chloride (OCEAN) 0.65 % SOLN nasal spray; Place 1 spray as needed into both nostrils for congestion. -     HYDROcodone-homatropine (HYCODAN) 5-1.5 MG/5ML syrup; Take 5 mLs every 6 (six) hours as needed by mouth for cough. -     albuterol (PROVENTIL HFA;VENTOLIN HFA) 108 (90 Base) MCG/ACT inhaler; Inhale 1-2 puffs every 6 (six) hours as needed into the lungs for wheezing or shortness of breath.  Acute bronchitis, unspecified organism -     levofloxacin (LEVAQUIN) 500 MG tablet; Take 1 tablet (500 mg total) daily by mouth. -     fluticasone (FLONASE) 50 MCG/ACT nasal spray; Place 2 sprays daily into both nostrils. -     sodium chloride (OCEAN) 0.65 % SOLN nasal spray; Place 1  spray as needed into both nostrils for congestion. -     HYDROcodone-homatropine (HYCODAN) 5-1.5 MG/5ML syrup; Take 5 mLs every 6 (six) hours as needed by mouth for cough. -     albuterol (PROVENTIL HFA;VENTOLIN HFA) 108 (90 Base) MCG/ACT inhaler; Inhale 1-2 puffs every 6 (six) hours as needed into the lungs for wheezing or shortness of breath.   I have discontinued Jocelyn Lamer Macera's predniSONE and valACYclovir. I am also having her start on levofloxacin, fluticasone, sodium chloride, HYDROcodone-homatropine, and albuterol. Additionally, I am having her maintain her acetaminophen, cetirizine, cholecalciferol, Magnesium, COD LIVER OIL PO, doxycycline, alendronate, furosemide, and nitroGLYCERIN.  Meds ordered this encounter  Medications  . levofloxacin (LEVAQUIN) 500 MG tablet    Sig: Take 1 tablet (500 mg total) daily by mouth.    Dispense:  5 tablet    Refill:  0    Order Specific Question:   Supervising Provider    Answer:   Cassandria Anger [[id number]]  . fluticasone (FLONASE) 50 MCG/ACT nasal spray    Sig: Place 2 sprays daily into both nostrils.    Dispense:  16 g    Refill:  0    Order Specific Question:   Supervising Provider    Answer:   Cassandria Anger [[id number]]  . sodium chloride (OCEAN) 0.65 % SOLN nasal spray    Sig: Place 1 spray as needed into both nostrils for congestion.    Dispense:  15 mL    Refill:  0    Order Specific Question:   Supervising Provider    Answer:   Cassandria Anger [[id number]]  . HYDROcodone-homatropine (HYCODAN) 5-1.5 MG/5ML syrup    Sig: Take 5 mLs every 6 (six) hours as needed by mouth for cough.    Dispense:  60 mL    Refill:  0    Order Specific Question:   Supervising Provider    Answer:   Cassandria Anger [[id number]]  . albuterol (PROVENTIL HFA;VENTOLIN HFA) 108 (90 Base) MCG/ACT inhaler    Sig: Inhale 1-2 puffs every 6 (six) hours as needed into the lungs for wheezing or shortness of breath.    Dispense:  1 Inhaler    Refill:  0    Order  Specific Question:   Supervising Provider    Answer:   Cassandria Anger [[id number]]    Follow-up: Return if symptoms worsen or fail to improve.  Wilfred Lacy, NP

## 2016-12-30 NOTE — Patient Instructions (Signed)
URI Instructions: Encourage adequate oral hydration.  Avoid decongestants if you have high blood pressure. Use" Delsym" or" Robitussin" cough syrup varietis for cough.  You can use plain "Tylenol" or "Advi"l for fever, chills and achyness.

## 2016-12-30 NOTE — Telephone Encounter (Signed)
Bethany Carter from [facility] need another copy of patient labs because you highlighted some the results and she is not able to read them. So can you please reschedule Fax # [phone]  Phone# [phone]

## 2017-01-02 DIAGNOSIS — I509 Heart failure, unspecified: Secondary | ICD-10-CM | POA: Diagnosis not present

## 2017-01-02 DIAGNOSIS — Z79899 Other long term (current) drug therapy: Secondary | ICD-10-CM | POA: Diagnosis not present

## 2017-01-02 DIAGNOSIS — Z886 Allergy status to analgesic agent status: Secondary | ICD-10-CM | POA: Diagnosis not present

## 2017-01-02 DIAGNOSIS — M810 Age-related osteoporosis without current pathological fracture: Secondary | ICD-10-CM | POA: Diagnosis not present

## 2017-01-02 DIAGNOSIS — Z885 Allergy status to narcotic agent status: Secondary | ICD-10-CM | POA: Diagnosis not present

## 2017-01-02 DIAGNOSIS — E210 Primary hyperparathyroidism: Secondary | ICD-10-CM | POA: Diagnosis not present

## 2017-01-09 DIAGNOSIS — E210 Primary hyperparathyroidism: Secondary | ICD-10-CM | POA: Diagnosis not present

## 2017-01-09 DIAGNOSIS — I509 Heart failure, unspecified: Secondary | ICD-10-CM | POA: Diagnosis not present

## 2017-01-13 DIAGNOSIS — I5032 Chronic diastolic (congestive) heart failure: Secondary | ICD-10-CM | POA: Insufficient documentation

## 2017-01-13 DIAGNOSIS — I509 Heart failure, unspecified: Secondary | ICD-10-CM | POA: Diagnosis not present

## 2017-01-13 DIAGNOSIS — E210 Primary hyperparathyroidism: Secondary | ICD-10-CM | POA: Diagnosis not present

## 2017-01-13 DIAGNOSIS — E278 Other specified disorders of adrenal gland: Secondary | ICD-10-CM | POA: Diagnosis not present

## 2017-01-13 DIAGNOSIS — Z9889 Other specified postprocedural states: Secondary | ICD-10-CM | POA: Insufficient documentation

## 2017-01-30 DIAGNOSIS — E213 Hyperparathyroidism, unspecified: Secondary | ICD-10-CM | POA: Diagnosis not present

## 2017-03-11 ENCOUNTER — Telehealth: Payer: Self-pay | Admitting: Endocrinology

## 2017-03-11 NOTE — Telephone Encounter (Signed)
I called patient & gave her both fax numbers because I haven't received any paperwork. However today patient switched to Dr. Cruzita Lederer.

## 2017-03-11 NOTE — Telephone Encounter (Signed)
Patient wants to make sure Dr Loanne Drilling has received the report from Dr. Fredirick Maudlin. Patient's ph# [phone]

## 2017-04-01 ENCOUNTER — Encounter: Payer: Self-pay | Admitting: Internal Medicine

## 2017-04-01 ENCOUNTER — Ambulatory Visit (INDEPENDENT_AMBULATORY_CARE_PROVIDER_SITE_OTHER): Payer: Medicare Other | Admitting: Internal Medicine

## 2017-04-01 VITALS — BP 116/78 | HR 73 | Temp 98.2°F | Ht 62.5 in | Wt 123.0 lb

## 2017-04-01 DIAGNOSIS — R221 Localized swelling, mass and lump, neck: Secondary | ICD-10-CM

## 2017-04-01 DIAGNOSIS — J069 Acute upper respiratory infection, unspecified: Secondary | ICD-10-CM

## 2017-04-01 DIAGNOSIS — H1033 Unspecified acute conjunctivitis, bilateral: Secondary | ICD-10-CM

## 2017-04-01 MED ORDER — ZOSTER VAC RECOMB ADJUVANTED 50 MCG/0.5ML IM SUSR: 0.5000 mL | Freq: Once | INTRAMUSCULAR | 1 refills | Status: AC

## 2017-04-01 MED ORDER — ERYTHROMYCIN 5 MG/GM OP OINT: 1.0000 "application " | TOPICAL_OINTMENT | Freq: Four times a day (QID) | OPHTHALMIC | 0 refills | Status: DC

## 2017-04-01 MED ORDER — AZITHROMYCIN 250 MG PO TABS: ORAL_TABLET | ORAL | 1 refills | Status: DC

## 2017-04-01 NOTE — Assessment & Plan Note (Signed)
?   Clinical significance, may be infectious related but the left subq nodular mass seems unusually hard and fixed  - cant r/o malignancy; pt decliens ENT for now, but will call in a few days if does not seem to be improving

## 2017-04-01 NOTE — Patient Instructions (Addendum)
Your shingles shot was sent to the pharmacy (remember to ask how much it costs)  Please take all new medication as prescribed - the eye antibiotic and pill antibiotics  If your knot the neck especially on the left is not improved, please call for ENT referral by Feb 11  Please continue all other medications as before, and refills have been done if requested.  Please have the pharmacy call with any other refills you may need  Please keep your appointments with your specialists as you may have planned
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
## 2017-04-01 NOTE — Assessment & Plan Note (Signed)
Mild to mod, for antibx course,  to f/u any worsening symptoms or concerns 

## 2017-04-23 ENCOUNTER — Telehealth: Payer: Self-pay | Admitting: Nurse Practitioner

## 2017-04-23 NOTE — Telephone Encounter (Signed)
Ok with me 

## 2017-04-23 NOTE — Telephone Encounter (Signed)
Patient is requesting to transfer from Dr [name] to [facility]. Dr. [name], is this okay with you? Ashleigh, is this okay with you?

## 2017-04-23 NOTE — Telephone Encounter (Signed)
Copied from [place] [id number]. Topic: Appointment Scheduling - Scheduling Inquiry for Clinic >> Apr 23, 2017 11:44 AM Arletha Grippe wrote: Reason for CRM: pt would like to switch providers from Dr [name] to [facility].  She would like to start seeing a woman, and also [facility] has an Administrator.  Please call [phone]

## 2017-04-24 NOTE — Telephone Encounter (Signed)
Left message letting patient know °

## 2017-05-15 ENCOUNTER — Other Ambulatory Visit: Payer: Self-pay | Admitting: Internal Medicine

## 2017-05-15 MED ORDER — OSELTAMIVIR PHOSPHATE 75 MG PO CAPS: 75.0000 mg | ORAL_CAPSULE | Freq: Two times a day (BID) | ORAL | 0 refills | Status: DC

## 2017-05-15 NOTE — Telephone Encounter (Signed)
Copied from [place] [id number]. Topic: Quick Communication - Rx Refill/Question >> May 15, 2017  7:58 AM Scherrie Gerlach wrote: Medication:  tami  flu  Pt is taking care of her 11 mo old grandson and would like to know if she could get this as a preventative. Her daughter is a Marine scientist and suggested she call  [id number] - [place], [place] [address] AT [address] [place] [phone] (Phone) [phone] (Fax)

## 2017-05-15 NOTE — Telephone Encounter (Signed)
thiis is normally done as preventive only if there some suspicion of higher risk to the patient, such as a family with known flu

## 2017-05-15 NOTE — Addendum Note (Signed)
Addended by: Biagio Borg on: 05/15/2017 09:56 AM   Modules accepted: Orders

## 2017-05-15 NOTE — Telephone Encounter (Signed)
Please advise 

## 2017-05-15 NOTE — Telephone Encounter (Signed)
Ok, that sounds good - done erx

## 2017-05-15 NOTE — Telephone Encounter (Signed)
Pt has been informed.

## 2017-05-15 NOTE — Telephone Encounter (Signed)
Spoke with pt and she said that he grandson does have the flu and the reason why she wanted Tamiflu to prevent her from getting in. Please advise.

## 2017-05-16 ENCOUNTER — Encounter: Payer: Self-pay | Admitting: Internal Medicine

## 2017-05-16 ENCOUNTER — Ambulatory Visit (INDEPENDENT_AMBULATORY_CARE_PROVIDER_SITE_OTHER): Payer: Medicare Other | Admitting: Internal Medicine

## 2017-05-16 VITALS — BP 116/74 | HR 75 | Ht 62.5 in | Wt 122.6 lb

## 2017-05-16 DIAGNOSIS — E210 Primary hyperparathyroidism: Secondary | ICD-10-CM

## 2017-05-16 DIAGNOSIS — M810 Age-related osteoporosis without current pathological fracture: Secondary | ICD-10-CM | POA: Diagnosis not present

## 2017-05-16 LAB — VITAMIN D 25 HYDROXY (VIT D DEFICIENCY, FRACTURES): VITD: 54.06 ng/mL (ref 30.00–100.00)
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
## 2017-05-16 NOTE — Patient Instructions (Addendum)
Please stop at the lab.  Please continue vitamin D 2000 units daily.  Try to get >1000 mg calcium preferably from the diet.  Try to get 50 g protein a day (preferably plant-based).  Please come back for a follow-up appointment in 1 year.  How Can I Prevent Falls? Men and women with osteoporosis need to take care not to fall down. Falls can break bones. Some reasons people fall are: Poor vision  Poor balance  Certain diseases that affect how you walk  Some types of medicine, such as sleeping pills.  Some tips to help prevent falls outdoors are: Use a cane or walker  Wear rubber-soled shoes so you don't slip  Walk on grass when sidewalks are slippery  In winter, put salt or kitty litter on icy sidewalks.  Some ways to help prevent falls indoors are: Keep rooms free of clutter, especially on floors  Use plastic or carpet runners on slippery floors  Wear low-heeled shoes that provide good support  Do not walk in socks, stockings, or slippers  Be sure carpets and area rugs have skid-proof backs or are tacked to the floor  Be sure stairs are well lit and have rails on both sides  Put grab bars on bathroom walls near tub, shower, and toilet  Use a rubber bath mat in the shower or tub  Keep a flashlight next to your bed  Use a sturdy step stool with a handrail and wide steps  Add more lights in rooms (and night lights) Buy a cordless phone to keep with you so that you don't have to rush to the phone       when it rings and so that you can call for help if you fall.   (adapted from http://www.niams.NightlifePreviews.se)  Please check out the following book about best diet for bone health:   Exercise for Strong Bones (from [place]) There are two types of exercises that are important for building and maintaining bone density:  weight-bearing and muscle-strengthening exercises. Weight-bearing Exercises These exercises  include activities that make you move against gravity while staying upright. Weight-bearing exercises can be high-impact or low-impact. High-impact weight-bearing exercises help build bones and keep them strong. If you have broken a bone due to osteoporosis or are at risk of breaking a bone, you may need to avoid high-impact exercises. If youre not sure, you should check with your healthcare provider. Examples of high-impact weight-bearing exercises are:  Dancing  Doing high-impact aerobics  Hiking  Jogging/running  Jumping Rope  Stair climbing  Tennis Low-impact weight-bearing exercises can also help keep bones strong and are a safe alternative if you cannot do high-impact exercises. Examples of low-impact weight-bearing exercises are:  Using elliptical training machines  Doing low-impact aerobics  Using stair-step machines  Fast walking on a treadmill or outside Muscle-Strengthening Exercises These exercises include activities where you move your body, a weight or some other resistance against gravity. They are also known as resistance exercises and include:  Lifting weights  Using elastic exercise bands  Using weight machines  Lifting your own body weight  Functional movements, such as standing and rising up on your toes Yoga and Pilates can also improve strength, balance and flexibility. However, certain positions may not be safe for people with osteoporosis or those at increased risk of broken bones. For example, exercises that have you bend forward may increase the chance of breaking a bone in the spine. A physical therapist should be able to help  you learn which exercises are safe and appropriate for you. Non-Impact Exercises Non-impact exercises can help you to improve balance, posture and how well you move in everyday activities. These exercises can also help to increase muscle strength and decrease the risk of falls and broken bones. Some of these exercises  include:  Balance exercises that strengthen your legs and test your balance, such as Tai Chi, can decrease your risk of falls.  Posture exercises that improve your posture and reduce rounded or sloping shoulders can help you decrease the chance of breaking a bone, especially in the spine.  Functional exercises that improve how well you move can help you with everyday activities and decrease your chance of falling and breaking a bone. For example, if you have trouble getting up from a chair or climbing stairs, you should do these activities as exercises. A physical therapist can teach you balance, posture and functional exercises. Starting a New Exercise Program If you havent exercised regularly for a while, check with your healthcare provider before beginning a new exercise program--particularly if you have health problems such as heart disease, diabetes or high blood pressure. If youre at high risk of breaking a bone, you should work with a physical therapist to develop a safe exercise program. Once you have your healthcare providers approval, start slowly. If youve already broken bones in the spine because of osteoporosis, be very careful to avoid activities that require reaching down, bending forward, rapid twisting motions, heavy lifting and those that increase your chance of a fall. As you get started, your muscles may feel sore for a day or two after you exercise. If soreness lasts longer, you may be working too hard and need to ease up. Exercises should be done in a pain-free range of motion. How Much Exercise Do You Need? Weight-bearing exercises 30 minutes on most days of the week. Do a 30-minutesession or multiple sessions spread out throughout the day. The benefits to your bones are the same.   Muscle-strengthening exercises Two to three days per week. If you dont have much time for strengthening/resistance training, do small amounts at a time. You can do just one body part each day.  For example do arms one day, legs the next and trunk the next. You can also spread these exercises out during your normal day.  Balance, posture and functional exercises Every day or as often as needed. You may want to focus on one area more than the others. If you have fallen or lose your balance, spend time doing balance exercises. If you are getting rounded shoulders, work more on posture exercises. If you have trouble climbing stairs or getting up from the couch, do more functional exercises. You can also perform these exercises at one time or spread them during your day. Work with a phyiscal therapist to learn the right exercises for you.    Denosumab: Patient drug information (Up-to-date) Copyright (262)[phone] [place] rights reserved.  Brand Names: [place]  ProliaDelton See What is this drug used for?  It is used to treat soft, brittle bones (osteoporosis).  It is used for bone growth.  It is used when treating some cancers.  It may be given to you for other reasons. Talk with the doctor. What do I need to tell my doctor BEFORE I take this drug?  All products:  If you have an allergy to denosumab or any other part of this drug.  If you are allergic to any drugs like  this one, any other drugs, foods, or other substances. Tell your doctor about the allergy and what signs you had, like rash; hives; itching; shortness of breath; wheezing; cough; swelling of face, lips, tongue, or throat; or any other signs.  If you have low calcium levels.  Prolia:  If you are pregnant or may be pregnant. Do not take this drug if you are pregnant.  This is not a list of all drugs or health problems that interact with this drug.  Tell your doctor and pharmacist about all of your drugs (prescription or OTC, natural products, vitamins) and health problems. You must check to make sure that it is safe for you to take this drug with all of your drugs and health problems. Do not start, stop, or change  the dose of any drug without checking with your doctor. What are some things I need to know or do while I take this drug?  All products:  Tell dentists, surgeons, and other doctors that you use this drug.  This drug may raise the chance of a broken leg. Talk with your doctor.  Have your blood work checked. Talk with your doctor.  Have a bone density test. Talk with your doctor.  Take calcium and vitamin D as you were told by your doctor.  Have a dental exam before starting this drug.  Take good care of your teeth. See a dentist often.  If you smoke, talk with your doctor.  Do not give to a child. Talk with your doctor.  Tell your doctor if you are breast-feeding. You will need to talk about any risks to your baby.  Delton See:  This drug may cause harm to the unborn baby if you take it while you are pregnant. If you get pregnant while taking this drug, call your doctor right away.  Prolia:  Very bad infections have been reported with use of this drug. If you have any infection, are taking antibiotics now or in the recent past, or have many infections, talk with your doctor.  You may have more chance of getting an infection. Wash hands often. Stay away from people with infections, colds, or flu.  Use birth control that you can trust to prevent pregnancy while taking this drug.  If you are a man and your sex partner is pregnant or gets pregnant at any time while you are being treated, talk with your doctor. What are some side effects that I need to call my doctor about right away?  WARNING/CAUTION: Even though it may be rare, some people may have very bad and sometimes deadly side effects when taking a drug. Tell your doctor or get medical help right away if you have any of the following signs or symptoms that may be related to a very bad side effect:  All products:  Signs of an allergic reaction, like rash; hives; itching; red, swollen, blistered, or peeling skin with or without  fever; wheezing; tightness in the chest or throat; trouble breathing or talking; unusual hoarseness; or swelling of the mouth, face, lips, tongue, or throat.  Signs of low calcium levels like muscle cramps or spasms, numbness and tingling, or seizures.  Mouth sores.  Any new or strange groin, hip, or thigh pain.  This drug may cause jawbone problems. The chance may be higher the longer you take this drug. The chance may be higher if you have cancer, dental problems, dentures that do not fit well, anemia, blood clotting problems, or an infection. The  chance may also be higher if you are having dental work or if you are getting chemo, some steroid drugs, or radiation. Call your doctor right away if you have jaw swelling or pain.  Xgeva:  Not hungry.  Muscle pain or weakness.  Seizures.  Shortness of breath.  Prolia:  Signs of infection. These include a fever of 100.48F (38C) or higher, chills, very bad sore throat, ear or sinus pain, cough, more sputum or change in color of sputum, pain with passing urine, mouth sores, wound that will not heal, or anal itching or pain.  Signs of a pancreas problem (pancreatitis) like very bad stomach pain, very bad back pain, or very bad upset stomach or throwing up.  Chest pain.  A heartbeat that does not feel normal.  Very bad skin irritation.  Feeling very tired or weak.  Bladder pain or pain when passing urine or change in how much urine is passed.  Passing urine often.  Swelling in the arms or legs. What are some other side effects of this drug?  All drugs may cause side effects. However, many people have no side effects or only have minor side effects. Call your doctor or get medical help if any of these side effects or any other side effects bother you or do not go away:  Xgeva:  Feeling tired or weak.  Headache.  Upset stomach or throwing up.  Loose stools (diarrhea).  Cough.  Prolia:  Back pain.  Muscle or joint pain.   Sore throat.  Runny nose.  Pain in arms or legs.  These are not all of the side effects that may occur. If you have questions about side effects, call your doctor. Call your doctor for medical advice about side effects.  You may report side effects to your national health agency. How is this drug best taken?  Use this drug as ordered by your doctor. Read and follow the dosing on the label closely.  It is given as a shot into the fatty part of the skin. What do I do if I miss a dose?  Call the doctor to find out what to do. How do I store and/or throw out this drug?  This drug will be given to you in a hospital or doctor's office. You will not store it at home.  Keep all drugs out of the reach of children and pets.  Check with your pharmacist about how to throw out unused drugs.  General drug facts  If your symptoms or health problems do not get better or if they become worse, call your doctor.  Do not share your drugs with others and do not take anyone else's drugs.  Keep a list of all your drugs (prescription, natural products, vitamins, OTC) with you. Give this list to your doctor.  Talk with the doctor before starting any new drug, including prescription or OTC, natural products, or vitamins.  Some drugs may have another patient information leaflet. If you have any questions about this drug, please talk with your doctor, pharmacist, or other health care provider.  If you think there has been an overdose, call your poison control center or get medical care right away. Be ready to tell or show what was taken, how much, and when it happened.

## 2017-05-17 LAB — PARATHYROID HORMONE, INTACT (NO CA): PTH: 26 pg/mL (ref 15–65)

## 2017-05-19 LAB — BASIC METABOLIC PANEL WITH GFR
BUN: 20 mg/dL (ref 7–25)
CO2: 29 mmol/L (ref 20–32)
Calcium: 9 mg/dL (ref 8.6–10.4)
Chloride: 105 mmol/L (ref 98–110)
Creat: 0.76 mg/dL (ref 0.50–0.99)
GFR, EST AFRICAN AMERICAN: 95 mL/min/{1.73_m2} (ref >=60)
GFR, EST NON AFRICAN AMERICAN: 82 mL/min/{1.73_m2} (ref >=60)
Glucose, Bld: 81 mg/dL (ref 65–99)
POTASSIUM: 4.1 mmol/L (ref 3.5–5.3)
SODIUM: 140 mmol/L (ref 135–146)

## 2017-05-19 LAB — PARATHYROID HORMONE, INTACT (NO CA)

## 2017-06-04 ENCOUNTER — Encounter: Payer: Self-pay | Admitting: Cardiology

## 2017-06-04 ENCOUNTER — Ambulatory Visit (INDEPENDENT_AMBULATORY_CARE_PROVIDER_SITE_OTHER): Payer: Medicare Other | Admitting: Cardiology

## 2017-06-04 VITALS — BP 108/78 | HR 71 | Ht 62.5 in | Wt 122.5 lb

## 2017-06-04 DIAGNOSIS — I201 Angina pectoris with documented spasm: Secondary | ICD-10-CM

## 2017-06-04 DIAGNOSIS — R600 Localized edema: Secondary | ICD-10-CM | POA: Diagnosis not present

## 2017-06-04 DIAGNOSIS — R42 Dizziness and giddiness: Secondary | ICD-10-CM
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
## 2017-06-04 NOTE — Patient Instructions (Signed)

## 2017-06-06 ENCOUNTER — Telehealth: Payer: Self-pay | Admitting: Internal Medicine

## 2017-06-06 NOTE — Telephone Encounter (Signed)
Patient checking status of Prolia. Please call ph# [phone]

## 2017-06-10 NOTE — Telephone Encounter (Signed)
SOB has come back and I will discuss the out-of-pocket cost with reps today

## 2017-06-16 NOTE — Telephone Encounter (Signed)
Scheduled 06/17/17

## 2017-06-17 ENCOUNTER — Ambulatory Visit (INDEPENDENT_AMBULATORY_CARE_PROVIDER_SITE_OTHER): Payer: Medicare Other

## 2017-06-17 DIAGNOSIS — M810 Age-related osteoporosis without current pathological fracture: Secondary | ICD-10-CM | POA: Diagnosis not present

## 2017-06-17 MED ORDER — DENOSUMAB 60 MG/ML ~~LOC~~ SOSY
60.0000 mg | PREFILLED_SYRINGE | Freq: Once | SUBCUTANEOUS | Status: AC
  Administered 2017-06-17: 60 mg via SUBCUTANEOUS

## 2017-06-17 NOTE — Progress Notes (Signed)
Prolia Injection#1 Pt here for Prolia injection. Gave 60mg  of Prolia in Left Upper Arm Patient tolerated well. Band-Aid applied.  Pt instructed to wait in lobby 10-77mins after injection given.

## 2017-06-20 ENCOUNTER — Ambulatory Visit (HOSPITAL_COMMUNITY)
Admission: EM | Admit: 2017-06-20 | Discharge: 2017-06-20 | Disposition: A | Discharge status: Home / Self Care | Payer: Medicare Other | Attending: Family Medicine | Admitting: Family Medicine

## 2017-06-20 ENCOUNTER — Encounter (HOSPITAL_COMMUNITY): Payer: Self-pay | Admitting: Emergency Medicine

## 2017-06-20 DIAGNOSIS — Z79899 Other long term (current) drug therapy: Secondary | ICD-10-CM | POA: Diagnosis not present

## 2017-06-20 DIAGNOSIS — L237 Allergic contact dermatitis due to plants, except food: Secondary | ICD-10-CM | POA: Diagnosis not present

## 2017-06-20 DIAGNOSIS — M542 Cervicalgia: Secondary | ICD-10-CM | POA: Diagnosis not present

## 2017-06-20 DIAGNOSIS — K115 Sialolithiasis: Secondary | ICD-10-CM | POA: Diagnosis not present

## 2017-06-20 DIAGNOSIS — G4453 Primary thunderclap headache: Secondary | ICD-10-CM | POA: Diagnosis not present

## 2017-06-20 MED ORDER — METHYLPREDNISOLONE ACETATE 80 MG/ML IJ SUSP
80.0000 mg | Freq: Once | INTRAMUSCULAR | Status: AC
  Administered 2017-06-20: 80 mg via INTRAMUSCULAR

## 2017-06-20 MED ORDER — METHYLPREDNISOLONE ACETATE 80 MG/ML IJ SUSP
INTRAMUSCULAR | Status: AC
  Filled 2017-06-20: qty 1

## 2017-06-20 MED ORDER — PENICILLIN V POTASSIUM 500 MG PO TABS: 500.0000 mg | ORAL_TABLET | Freq: Three times a day (TID) | ORAL | 0 refills | Status: DC

## 2017-06-20 NOTE — ED Triage Notes (Signed)
Pt also c/o poison ivy on bilateral arms.

## 2017-06-20 NOTE — ED Triage Notes (Signed)
Pt c/o L neck swelling, pt states it hurts when she eats, chewing makes pain worse x3 days.
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
## 2017-06-24 ENCOUNTER — Other Ambulatory Visit: Payer: Self-pay | Admitting: Neurology

## 2017-06-24 DIAGNOSIS — G4453 Primary thunderclap headache: Secondary | ICD-10-CM

## 2017-06-30 ENCOUNTER — Ambulatory Visit
Admission: RE | Admit: 2017-06-30 | Discharge: 2017-06-30 | Disposition: A | Discharge status: Home / Self Care | Payer: Medicare Other | Source: Ambulatory Visit | Attending: Neurology | Admitting: Neurology

## 2017-06-30 DIAGNOSIS — R51 Headache: Secondary | ICD-10-CM | POA: Diagnosis not present

## 2017-06-30 DIAGNOSIS — G4453 Primary thunderclap headache: Secondary | ICD-10-CM

## 2017-07-01 ENCOUNTER — Other Ambulatory Visit: Payer: Self-pay | Admitting: Neurology

## 2017-07-01 DIAGNOSIS — G4453 Primary thunderclap headache: Secondary | ICD-10-CM

## 2017-07-02 ENCOUNTER — Ambulatory Visit
Admission: RE | Admit: 2017-07-02 | Discharge: 2017-07-02 | Disposition: A | Discharge status: Home / Self Care | Payer: Medicare Other | Source: Ambulatory Visit | Attending: Neurology | Admitting: Neurology

## 2017-07-02 DIAGNOSIS — R51 Headache: Secondary | ICD-10-CM | POA: Diagnosis not present

## 2017-07-02 DIAGNOSIS — G4453 Primary thunderclap headache: Secondary | ICD-10-CM

## 2017-07-07 DIAGNOSIS — J342 Deviated nasal septum: Secondary | ICD-10-CM | POA: Insufficient documentation

## 2017-07-07 DIAGNOSIS — J343 Hypertrophy of nasal turbinates: Secondary | ICD-10-CM | POA: Diagnosis not present

## 2017-07-07 DIAGNOSIS — J302 Other seasonal allergic rhinitis: Secondary | ICD-10-CM | POA: Diagnosis not present

## 2017-07-07 DIAGNOSIS — K1121 Acute sialoadenitis: Secondary | ICD-10-CM | POA: Insufficient documentation

## 2017-07-11 ENCOUNTER — Encounter: Payer: Self-pay | Admitting: Nurse Practitioner

## 2017-07-11 ENCOUNTER — Ambulatory Visit (INDEPENDENT_AMBULATORY_CARE_PROVIDER_SITE_OTHER): Payer: Medicare Other | Admitting: Nurse Practitioner

## 2017-07-11 VITALS — BP 108/78 | HR 70 | Temp 98.0°F | Resp 16 | Ht 62.5 in | Wt 124.0 lb

## 2017-07-11 DIAGNOSIS — M5442 Lumbago with sciatica, left side: Secondary | ICD-10-CM

## 2017-07-11 DIAGNOSIS — Z0000 Encounter for general adult medical examination without abnormal findings: Secondary | ICD-10-CM

## 2017-07-11 DIAGNOSIS — G8929 Other chronic pain: Secondary | ICD-10-CM

## 2017-07-11 DIAGNOSIS — R4582 Worries: Secondary | ICD-10-CM | POA: Diagnosis not present

## 2017-07-11 DIAGNOSIS — Z13220 Encounter for screening for lipoid disorders: Secondary | ICD-10-CM | POA: Diagnosis not present

## 2017-07-11 NOTE — Progress Notes (Signed)
Name: Bethany Carter   MRN: [id number]    DOB: 07/14/1950   Date:07/11/2017       Progress Note  Subjective  Chief Complaint  Chief Complaint  Patient presents with  . Establish Care    HPI Bethany Carter Is here today to establish care. 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 She recently was released  from [facility] Dr Celine Ahr s/p parathyroidectomy and she is doing well. She continues routine follow up with endocrinology for osteoporosis and hyperparathyroidism, and cardiology for prinzmetal angina, edema. We will update her health maintenance and discuss back pain today.  Back pain- This is a chronic problem Her back pain began years ago She has not been evaluated for back pain prior She describes as intermittent aching pain that sometimes radiates into her left hip She denies weakness, falls, numbness, tingling, incontinence. The pain has not worsened over time   Patient Active Problem List   Diagnosis Date Noted  . Acute conjunctivitis of both eyes 04/01/2017  . Lump in neck 04/01/2017  . Primary hyperparathyroidism ([place]) 11/07/2016  . CAD (coronary artery disease) 06/27/2016  . Lower extremity edema 06/27/2016  . Hypercalcemia 05/31/2016  . Encounter for routine laboratory testing 10/24/2015  . Poison ivy dermatitis 10/13/2015  . Prinzmetal angina ([place]) 06/08/2014  . Acute upper respiratory infection 02/15/2014  . Conjunctivitis 02/15/2014  . Abnormal nuclear cardiac imaging test 08/30/2013  . Gastritis 08/30/2013  . Elevated amylase and lipase- MRI unremarkable 08/30/2013  . Fatigue 08/25/2013  . Dizzy 07/24/2013  . Low libido 07/21/2013  . Other malaise and fatigue 07/21/2013  . Chest and back pain with moderate risk of acute coronary syndrome 07/21/2013  . Anemia, iron deficiency 03/12/2013  . Neck pain on right side 03/07/2013  . Microcytic anemia 03/07/2013  . Osteoporosis 10/09/2010  . Allergy   . Cervical disc disease      Past Surgical History:  Procedure Laterality Date  . BREAST ENHANCEMENT SURGERY  2003  . childbirth  1980  . COLONOSCOPY    . [place]  . LEFT HEART CATHETERIZATION WITH CORONARY ANGIOGRAM N/A 08/30/2013   Procedure: LEFT HEART CATHETERIZATION WITH CORONARY ANGIOGRAM;  Surgeon: Burnell Blanks, MD;  Location: [facility] CATH LAB;  Service: Cardiovascular;  Laterality: N/A;  . TONSILLECTOMY  1958    Family History  Problem Relation Age of Onset  . Hypertension Mother   . Peripheral Artery Disease Mother   . Arthritis Father   . Colon cancer Father   . Diabetes Father   . Colon polyps Father   . Arthritis Other   . Hypertension Other   . Stomach cancer Paternal Uncle   . Breast cancer Paternal Aunt   . Colon cancer Paternal Aunt   . Arthritis Other   . Hypertension Other   . Sudden death Other        uncle, <50  . Colon cancer Other   . Breast cancer Unknown   . Ovarian cancer Unknown   . Esophageal cancer Neg Hx   . Rectal cancer Neg Hx   . Hyperparathyroidism Neg Hx     Social History   Socioeconomic History  . Marital status: Divorced    Spouse name: Not on file  . Number of children: Not on file  . Years of education: Not on file  . Highest education level: Not on file  Occupational History  . Not on file  Social Needs  . Financial  resource strain: Not on file  . Food insecurity:    Worry: Not on file    Inability: Not on file  . Transportation needs:    Medical: Not on file    Non-medical: Not on file  Tobacco Use  . Smoking status: Never Smoker  . Smokeless tobacco: Never Used  Substance and Sexual Activity  . Alcohol use: Yes    Comment: social,occasional  . Drug use: No  . Sexual activity: Not on file  Lifestyle  . Physical activity:    Days per week: Not on file    Minutes per session: Not on file  . Stress: Not on file  Relationships  . Social connections:    Talks on phone: Not on file    Gets together: Not on file     Attends religious service: Not on file    Active member of club or organization: Not on file    Attends meetings of clubs or organizations: Not on file    Relationship status: Not on file  . Intimate partner violence:    Fear of current or ex partner: Not on file    Emotionally abused: Not on file    Physically abused: Not on file    Forced sexual activity: Not on file  Other Topics Concern  . Not on file  Social History Narrative  . Not on file     Current Outpatient Medications:  .  acetaminophen (TYLENOL) 500 MG tablet, Take 500 mg by mouth every 8 (eight) hours as needed for moderate pain., Disp: , Rfl:  .  cetirizine (ZYRTEC) 10 MG tablet, Take 10 mg by mouth daily as needed for allergies., Disp: , Rfl:  .  cholecalciferol (VITAMIN D) 1000 units tablet, Take 2,000 Units by mouth daily., Disp: , Rfl:  .  COD LIVER OIL PO, Take by mouth daily., Disp: , Rfl:  .  denosumab (PROLIA) 60 MG/ML SOSY injection, Inject 60 mg into the skin every 6 (six) months., Disp: , Rfl:  .  furosemide (LASIX) 20 MG tablet, Take 1 tab by mouth daily for swelling, then if swelling resolves, switch to taking 1 tab by mouth every other day, thereafter., Disp: 90 tablet, Rfl: 3 .  Magnesium 100 MG CAPS, Take 300 mg by mouth as directed., Disp: , Rfl:  .  nitroGLYCERIN (NITROSTAT) 0.4 MG SL tablet, Place 1 tablet (0.4 mg total) under the tongue every 5 (five) minutes as needed for chest pain., Disp: 25 tablet, Rfl: 2  Allergies  Allergen Reactions  . Codeine Shortness Of Breath and Other (See Comments)    Severe abdominal pain  . Percocet [Oxycodone-Acetaminophen] Shortness Of Breath    Intense abd pain     Review of Systems  Constitutional: Negative for malaise/fatigue and weight loss.  Respiratory: Negative for shortness of breath.   Cardiovascular: Negative for chest pain and leg swelling.  Gastrointestinal: Negative for constipation and diarrhea.  Genitourinary: Negative for dysuria, frequency  and urgency.  Musculoskeletal: Positive for back pain.  Neurological: Negative for tingling and weakness.  Psychiatric/Behavioral: The patient does not have insomnia.     Objective  Vitals:   07/11/17 0957  BP: 108/78  Pulse: 70  Resp: 16  Temp: 98 F (36.7 C)  TempSrc: Oral  SpO2: 98%  Weight: 124 lb (56.2 kg)  Height: 5' 2.5" (1.588 m)    Body mass index is 22.32 kg/m.  Physical Exam Vital signs reviewed Constitutional: Patient appears well-developed and well-nourished. No distress.  HENT: Head: Normocephalic and atraumatic. Ears: Bilateral TMs without erythema or effusion; Nose: Nose normal. Mouth/Throat: Oropharynx is clear and moist. No oropharyngeal exudate.  Eyes: Conjunctivae and EOM are normal. Pupils are equal, round, and reactive to light. No scleral icterus.  Neck: Normal range of motion. Neck supple. No thyromegaly present. No cervical adenopathy. Cardiovascular: Normal rate, regular rhythm and normal heart sounds.  No murmur heard. No BLE edema. Distal pulses intact. Pulmonary/Chest: Effort normal and breath sounds normal. No respiratory distress. Abdominal: Soft. Bowel sounds are normal, no distension. There is no tenderness. no masses Musculoskeletal: Normal range of motion, No gross deformities. Neurological: She is alert and oriented to person, place, and time. No cranial nerve deficit. Coordination, balance, strength, speech and gait are normal.  Skin: Skin is warm and dry. No rash noted. No erythema.  Psychiatric: Patient has a normal mood and affect. behavior is normal. Judgment and thought content normal.   PHQ2/9: Depression screen [facility] 2/9 07/11/2017 06/28/2016 05/31/2016 03/04/2013  Decreased Interest 0 0 0 0  Down, Depressed, Hopeless 0 0 0 0  PHQ - 2 Score 0 0 0 0  Altered sleeping 0 - - -  Tired, decreased energy 0 - - -  Change in appetite 0 - - -  Feeling bad or failure about yourself  0 - - -  Trouble concentrating 0 - - -  Moving slowly or  fidgety/restless 0 - - -  Suicidal thoughts 0 - - -  PHQ-9 Score 0 - - -  Difficult doing work/chores Not difficult at all - - -    Fall Risk: Fall Risk  07/11/2017 06/28/2016 05/31/2016 03/04/2013  Falls in the past year? No No No No   Assessment & Plan RTC in 1 year for annual health maintenance Vitamin D level WNL 05/16/17  Health maintenance examination Health maintenance- Mammogram overdue- she has been receiving mammograms at the breast center and says she will call to schedule next mammogram, she will let me know if any difficulty in scheduling DEXA- 04/22/2016 up to date Vaccinations- up to date colonoscopy - up to date - CBC; Future - Comprehensive metabolic panel; Future -Screening for cholesterol level- Lipid panel; Future - TSH; Future

## 2017-07-11 NOTE — Patient Instructions (Addendum)
If you have medicare related insurance (such as traditional Medicare, [employer], [employer], or similar), Please make an appointment at the scheduling desk with Sharee Pimple, the [employer], for your Wellness visit in this office, which is a benefit with your insurance.  Please return to the lab downstairs for labwork when fasting- only water or black coffee for 8 hours prior  I have attached back exercises for you. If your back exercises worsens or does not improve please schedule a follow up appointment for further evaluation here with Dr Tamala Julian or Dr Raeford Razor, our sports medicine providers.   Back Exercises If you have pain in your back, do these exercises 2-3 times each day or as told by your doctor. When the pain goes away, do the exercises once each day, but repeat the steps more times for each exercise (do more repetitions). If you do not have pain in your back, do these exercises once each day or as told by your doctor. Exercises Single Knee to Chest  Do these steps 3-5 times in a row for each leg: 1. Lie on your back on a firm bed or the floor with your legs stretched out. 2. Bring one knee to your chest. 3. Hold your knee to your chest by grabbing your knee or thigh. 4. Pull on your knee until you feel a gentle stretch in your lower back. 5. Keep doing the stretch for 10-30 seconds. 6. Slowly let go of your leg and straighten it.  Pelvic Tilt  Do these steps 5-10 times in a row: 1. Lie on your back on a firm bed or the floor with your legs stretched out. 2. Bend your knees so they point up to the ceiling. Your feet should be flat on the floor. 3. Tighten your lower belly (abdomen) muscles to press your lower back against the floor. This will make your tailbone point up to the ceiling instead of pointing down to your feet or the floor. 4. Stay in this position for 5-10 seconds while you gently tighten your muscles and breathe evenly.  Cat-Cow  Do  these steps until your lower back bends more easily: 1. Get on your hands and knees on a firm surface. Keep your hands under your shoulders, and keep your knees under your hips. You may put padding under your knees. 2. Let your head hang down, and make your tailbone point down to the floor so your lower back is round like the back of a cat. 3. Stay in this position for 5 seconds. 4. Slowly lift your head and make your tailbone point up to the ceiling so your back hangs low (sags) like the back of a cow. 5. Stay in this position for 5 seconds.  Press-Ups  Do these steps 5-10 times in a row: 1. Lie on your belly (face-down) on the floor. 2. Place your hands near your head, about shoulder-width apart. 3. While you keep your back relaxed and keep your hips on the floor, slowly straighten your arms to raise the top half of your body and lift your shoulders. Do not use your back muscles. To make yourself more comfortable, you may change where you place your hands. 4. Stay in this position for 5 seconds. 5. Slowly return to lying flat on the floor.  Bridges  Do these steps 10 times in a row: 1. Lie on your back on a firm surface. 2. Bend your knees so they point up to the ceiling. Your  feet should be flat on the floor. 3. Tighten your butt muscles and lift your butt off of the floor until your waist is almost as high as your knees. If you do not feel the muscles working in your butt and the back of your thighs, slide your feet 1-2 inches farther away from your butt. 4. Stay in this position for 3-5 seconds. 5. Slowly lower your butt to the floor, and let your butt muscles relax.  If this exercise is too easy, try doing it with your arms crossed over your chest. Belly Crunches  Do these steps 5-10 times in a row: 1. Lie on your back on a firm bed or the floor with your legs stretched out. 2. Bend your knees so they point up to the ceiling. Your feet should be flat on the floor. 3. Cross your  arms over your chest. 4. Tip your chin a little bit toward your chest but do not bend your neck. 5. Tighten your belly muscles and slowly raise your chest just enough to lift your shoulder blades a tiny bit off of the floor. 6. Slowly lower your chest and your head to the floor.  Back Lifts Do these steps 5-10 times in a row: 1. Lie on your belly (face-down) with your arms at your sides, and rest your forehead on the floor. 2. Tighten the muscles in your legs and your butt. 3. Slowly lift your chest off of the floor while you keep your hips on the floor. Keep the back of your head in line with the curve in your back. Look at the floor while you do this. 4. Stay in this position for 3-5 seconds. 5. Slowly lower your chest and your face to the floor.  Contact a doctor if:  Your back pain gets a lot worse when you do an exercise.  Your back pain does not lessen 2 hours after you exercise. If you have any of these problems, stop doing the exercises. Do not do them again unless your doctor says it is okay. Get help right away if:  You have sudden, very bad back pain. If this happens, stop doing the exercises. Do not do them again unless your doctor says it is okay. This information is not intended to replace advice given to you by your health care provider. Make sure you discuss any questions you have with your health care provider. Document Released: 03/16/2010 Document Revised: 07/20/2015 Document Reviewed: 04/07/2014 Elsevier Interactive Patient Education  [employer].

## 2017-07-11 NOTE — Assessment & Plan Note (Signed)
Discussed home management of back pain including daily back exercises and provided information in AVS Return precaution discussed F/u with sports med for no improvement or if pain persists

## 2017-07-16 ENCOUNTER — Other Ambulatory Visit (INDEPENDENT_AMBULATORY_CARE_PROVIDER_SITE_OTHER): Payer: Medicare Other

## 2017-07-16 DIAGNOSIS — Z13220 Encounter for screening for lipoid disorders: Secondary | ICD-10-CM

## 2017-07-16 DIAGNOSIS — Z0000 Encounter for general adult medical examination without abnormal findings: Secondary | ICD-10-CM

## 2017-07-16 DIAGNOSIS — R4582 Worries: Secondary | ICD-10-CM

## 2017-07-16 LAB — COMPREHENSIVE METABOLIC PANEL
ALK PHOS: 58 U/L (ref 39–117)
ALT: 13 U/L (ref 0–35)
AST: 19 U/L (ref 0–37)
Albumin: 4.1 g/dL (ref 3.5–5.2)
BILIRUBIN TOTAL: 0.5 mg/dL (ref 0.2–1.2)
BUN: 18 mg/dL (ref 6–23)
CO2: 29 meq/L (ref 19–32)
CREATININE: 0.68 mg/dL (ref 0.40–1.20)
Calcium: 8.9 mg/dL (ref 8.4–10.5)
Chloride: 106 mEq/L (ref 96–112)
GFR: 91.75 mL/min (ref >60.00)
GLUCOSE: 91 mg/dL (ref 70–99)
Potassium: 4.4 mEq/L (ref 3.5–5.1)
Sodium: 140 mEq/L (ref 135–145)
TOTAL PROTEIN: 6.9 g/dL (ref 6.0–8.3)

## 2017-07-16 LAB — LIPID PANEL
Cholesterol: 188 mg/dL (ref 0–200)
HDL: 80.7 mg/dL (ref >39.00)
LDL Cholesterol: 94 mg/dL (ref 0–99)
NONHDL: 107.01
TRIGLYCERIDES: 65 mg/dL (ref 0.0–149.0)
Total CHOL/HDL Ratio: 2
VLDL: 13 mg/dL (ref 0.0–40.0)

## 2017-07-16 LAB — CBC
HCT: 41.8 % (ref 36.0–46.0)
Hemoglobin: 13.9 g/dL (ref 12.0–15.0)
MCHC: 33.3 g/dL (ref 30.0–36.0)
MCV: 88.7 fl (ref 78.0–100.0)
PLATELETS: 206 10*3/uL (ref 150.0–400.0)
RBC: 4.71 Mil/uL (ref 3.87–5.11)
RDW: 13.9 % (ref 11.5–15.5)
WBC: 4.3 10*3/uL (ref 4.0–10.5)

## 2017-07-16 LAB — TSH: TSH: 1.81 u[IU]/mL (ref 0.35–4.50)

## 2017-07-17 ENCOUNTER — Ambulatory Visit (INDEPENDENT_AMBULATORY_CARE_PROVIDER_SITE_OTHER): Payer: Medicare Other | Admitting: *Deleted

## 2017-07-17 VITALS — BP 112/62 | HR 76 | Resp 18 | Ht 63.0 in | Wt 122.0 lb

## 2017-07-17 DIAGNOSIS — Z1231 Encounter for screening mammogram for malignant neoplasm of breast: Secondary | ICD-10-CM | POA: Diagnosis not present

## 2017-07-17 DIAGNOSIS — Z0000 Encounter for general adult medical examination without abnormal findings: Secondary | ICD-10-CM

## 2017-07-17 NOTE — Progress Notes (Signed)
Medical screening examination/treatment/procedure(s) were performed by the Wellness Coach, RN. As primary care provider I was immediately available for consulation/collaboration. I agree with above documentation. Makailyn Mccormick, NP  
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
## 2017-07-17 NOTE — Patient Instructions (Addendum)
Continue doing brain stimulating activities (puzzles, reading, adult coloring books, staying active) to keep memory sharp.   Continue to eat heart healthy diet (full of fruits, vegetables, whole grains, lean protein, water--limit salt, fat, and sugar intake) and increase physical activity as tolerated.   Bethany Carter , Thank you for taking time to come for your Medicare Wellness Visit. I appreciate your ongoing commitment to your health goals. Please review the following plan we discussed and let me know if I can assist you in the future.   These are the goals we discussed: Goals    . Patient Stated     Maintain current health status, enjoy my family, grandson, dog, and life.       This is a list of the screening recommended for you and due dates:  Health Maintenance  Topic Date Due  . Mammogram  03/22/2017  . Flu Shot  09/25/2017  . Pneumonia vaccines (2 of 2 - PPSV23) 12/13/2017  . Colon Cancer Screening  04/29/2021  . Tetanus Vaccine  11/18/2022  . DEXA scan (bone density measurement)  Completed  .  Hepatitis C: One time screening is recommended by Center for Disease Control  (CDC) for  adults born from [birth year] through 1965.   Completed

## 2017-07-25 ENCOUNTER — Encounter

## 2017-08-13 ENCOUNTER — Ambulatory Visit
Admission: RE | Admit: 2017-08-13 | Discharge: 2017-08-13 | Disposition: A | Discharge status: Home / Self Care | Payer: Medicare Other | Source: Ambulatory Visit | Attending: Nurse Practitioner | Admitting: Nurse Practitioner

## 2017-08-13 DIAGNOSIS — Z1231 Encounter for screening mammogram for malignant neoplasm of breast: Secondary | ICD-10-CM

## 2017-09-15 DIAGNOSIS — Z411 Encounter for cosmetic surgery: Secondary | ICD-10-CM | POA: Diagnosis not present

## 2017-09-15 DIAGNOSIS — L237 Allergic contact dermatitis due to plants, except food: Secondary | ICD-10-CM | POA: Diagnosis not present

## 2017-09-18 ENCOUNTER — Other Ambulatory Visit: Payer: Self-pay | Admitting: Cardiology

## 2017-10-07 ENCOUNTER — Ambulatory Visit: Payer: Medicare Other | Admitting: Endocrinology

## 2017-12-03 ENCOUNTER — Telehealth: Payer: Self-pay | Admitting: Endocrinology

## 2017-12-03 NOTE — Telephone Encounter (Signed)
Pt called because its been 6 months since her last prolia inj. She wants to schedule another appt

## 2017-12-05 NOTE — Telephone Encounter (Signed)
Patient verified through the portal and waiting for summary of benefits to come back

## 2017-12-10 ENCOUNTER — Ambulatory Visit (INDEPENDENT_AMBULATORY_CARE_PROVIDER_SITE_OTHER): Payer: Medicare Other

## 2017-12-10 DIAGNOSIS — Z23 Encounter for immunization: Secondary | ICD-10-CM | POA: Diagnosis not present

## 2017-12-12 NOTE — Telephone Encounter (Signed)
LMTCB to schedule prolia

## 2017-12-12 NOTE — Telephone Encounter (Signed)
Patient has been approved and is ready to be scheduled for nurse visit to get prolia she owes $0

## 2017-12-17 ENCOUNTER — Ambulatory Visit (INDEPENDENT_AMBULATORY_CARE_PROVIDER_SITE_OTHER): Payer: Medicare Other

## 2017-12-17 DIAGNOSIS — M810 Age-related osteoporosis without current pathological fracture: Secondary | ICD-10-CM

## 2017-12-17 MED ORDER — DENOSUMAB 60 MG/ML ~~LOC~~ SOSY
60.0000 mg | PREFILLED_SYRINGE | Freq: Once | SUBCUTANEOUS | Status: AC
  Administered 2017-12-17: 60 mg via SUBCUTANEOUS

## 2017-12-17 NOTE — Progress Notes (Signed)
Per orders of Dr.Gherghe injection of Prolia given today by Linus Galas CMA . Patient tolerated injection well.

## 2018-03-16 DIAGNOSIS — M67911 Unspecified disorder of synovium and tendon, right shoulder: Secondary | ICD-10-CM | POA: Diagnosis not present

## 2018-03-26 DIAGNOSIS — M7541 Impingement syndrome of right shoulder: Secondary | ICD-10-CM | POA: Diagnosis not present

## 2018-04-06 DIAGNOSIS — M7541 Impingement syndrome of right shoulder: Secondary | ICD-10-CM | POA: Diagnosis not present

## 2018-04-08 DIAGNOSIS — M7541 Impingement syndrome of right shoulder: Secondary | ICD-10-CM | POA: Diagnosis not present

## 2018-04-13 DIAGNOSIS — M7541 Impingement syndrome of right shoulder: Secondary | ICD-10-CM | POA: Diagnosis not present

## 2018-04-15 DIAGNOSIS — M7541 Impingement syndrome of right shoulder: Secondary | ICD-10-CM | POA: Diagnosis not present

## 2018-04-20 DIAGNOSIS — M67911 Unspecified disorder of synovium and tendon, right shoulder: Secondary | ICD-10-CM | POA: Diagnosis not present

## 2018-05-05 DIAGNOSIS — M25511 Pain in right shoulder: Secondary | ICD-10-CM | POA: Diagnosis not present

## 2018-05-08 ENCOUNTER — Encounter: Payer: Self-pay | Admitting: Cardiology

## 2018-05-08 DIAGNOSIS — M7501 Adhesive capsulitis of right shoulder: Secondary | ICD-10-CM | POA: Diagnosis not present

## 2018-05-13 ENCOUNTER — Other Ambulatory Visit: Payer: Self-pay

## 2018-05-13 ENCOUNTER — Ambulatory Visit: Payer: Self-pay | Admitting: *Deleted

## 2018-05-13 ENCOUNTER — Encounter: Payer: Self-pay | Admitting: Family

## 2018-05-13 ENCOUNTER — Ambulatory Visit (INDEPENDENT_AMBULATORY_CARE_PROVIDER_SITE_OTHER): Payer: Medicare Other | Admitting: Family

## 2018-05-13 VITALS — BP 110/68 | HR 70 | Temp 98.2°F | Ht 63.0 in | Wt 115.1 lb

## 2018-05-13 DIAGNOSIS — J309 Allergic rhinitis, unspecified: Secondary | ICD-10-CM | POA: Diagnosis not present

## 2018-05-13 MED ORDER — MONTELUKAST SODIUM 10 MG PO TABS: 10.0000 mg | ORAL_TABLET | Freq: Every day | ORAL | 3 refills | Status: DC

## 2018-05-13 MED ORDER — LEVOCETIRIZINE DIHYDROCHLORIDE 5 MG PO TABS: 5.0000 mg | ORAL_TABLET | Freq: Every evening | ORAL | 11 refills | Status: DC

## 2018-05-13 NOTE — Patient Instructions (Signed)
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
## 2018-05-13 NOTE — Telephone Encounter (Signed)
Patient is calling reporting she has sore throat, congestion, cough. No fever. She has started her allergy medication.  Reason for Disposition . [1] Sore throat with cough/cold symptoms AND [2] present > 5 days  Answer Assessment - Initial Assessment Questions 1. ONSET: "When did the throat start hurting?" (Hours or days ago)      2 days 2. SEVERITY: "How bad is the sore throat?" (Scale 1-10; mild, moderate or severe)   - MILD (1-3):  doesn't interfere with eating or normal activities   - MODERATE (4-7): interferes with eating some solids and normal activities   - SEVERE (8-10):  excruciating pain, interferes with most normal activities   - SEVERE DYSPHAGIA: can't swallow liquids, drooling     moderate 3. STREP EXPOSURE: "Has there been any exposure to strep within the past week?" If so, ask: "What type of contact occurred?"      Not that she is aware 4.  VIRAL SYMPTOMS: "Are there any symptoms of a cold, such as a runny nose, cough, hoarse voice or red eyes?"      Cold symptom, congestion 5. FEVER: "Do you have a fever?" If so, ask: "What is your temperature, how was it measured, and when did it start?"     No fever 6. PUS ON THE TONSILS: "Is there pus on the tonsils in the back of your throat?"     White in the back of throat 7. OTHER SYMPTOMS: "Do you have any other symptoms?" (e.g., difficulty breathing, headache, rash)     headache 8. PREGNANCY: "Is there any chance you are pregnant?" "When was your last menstrual period?"     n/a  Protocols used: SORE THROAT-A-AH

## 2018-05-14 ENCOUNTER — Telehealth: Payer: Self-pay

## 2018-05-14 NOTE — Telephone Encounter (Signed)
-----   Message from Consuelo Pandy, [place] sent at 05/12/2018  3:51 PM EDT ----- Regarding: reschedule Chart reviewed. Normal cath. Being followed for coronary spasms. If she is stable w/o active cardiac symptoms, ok to reschedule appt up to 2-3 months. Can refill Imdur if needed to get her to her next f/u.

## 2018-05-14 NOTE — Telephone Encounter (Signed)
I spoke to the patient (asymptomatic) and she is fine to reschedule.

## 2018-05-18 ENCOUNTER — Ambulatory Visit (INDEPENDENT_AMBULATORY_CARE_PROVIDER_SITE_OTHER): Payer: Medicare Other | Admitting: Internal Medicine

## 2018-05-18 ENCOUNTER — Other Ambulatory Visit: Payer: Self-pay

## 2018-05-18 ENCOUNTER — Encounter: Payer: Self-pay | Admitting: Internal Medicine

## 2018-05-18 VITALS — BP 120/70 | HR 71 | Temp 97.6°F | Ht 63.0 in | Wt 118.0 lb

## 2018-05-18 DIAGNOSIS — Z8639 Personal history of other endocrine, nutritional and metabolic disease: Secondary | ICD-10-CM

## 2018-05-18 DIAGNOSIS — M810 Age-related osteoporosis without current pathological fracture: Secondary | ICD-10-CM

## 2018-05-18 DIAGNOSIS — E210 Primary hyperparathyroidism: Secondary | ICD-10-CM | POA: Diagnosis not present

## 2018-05-18 LAB — VITAMIN D 25 HYDROXY (VIT D DEFICIENCY, FRACTURES): VITD: 56.99 ng/mL (ref 30.00–100.00)

## 2018-05-18 NOTE — Patient Instructions (Signed)
Please stop at the lab.  Continue Vitamin D 2000 units daily.  Please contact me next spring to order the next DXA scan.  Please come back for a follow-up appointment in 1 year.
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
## 2018-05-19 LAB — BASIC METABOLIC PANEL WITH GFR
BUN: 17 mg/dL (ref 7–25)
CO2: 31 mmol/L (ref 20–32)
Calcium: 9 mg/dL (ref 8.6–10.4)
Chloride: 105 mmol/L (ref 98–110)
Creat: 0.7 mg/dL (ref 0.50–0.99)
GFR, EST NON AFRICAN AMERICAN: 90 mL/min/{1.73_m2} (ref >=60)
GFR, Est African American: 104 mL/min/{1.73_m2} (ref >=60)
Glucose, Bld: 84 mg/dL (ref 65–99)
POTASSIUM: 4.4 mmol/L (ref 3.5–5.3)
SODIUM: 142 mmol/L (ref 135–146)

## 2018-05-20 ENCOUNTER — Ambulatory Visit: Payer: Medicare Other | Admitting: Cardiology

## 2018-06-09 NOTE — Telephone Encounter (Signed)
Virtual Visit Pre-Appointment Phone Call  TELEPHONE CALL NOTE  Pearline Yerby has been deemed a candidate for a follow-up tele-health visit to limit community exposure during the Covid-19 pandemic. I spoke with the patient via phone to ensure availability of phone/video source, confirm preferred email & phone number, and discuss instructions and expectations.  I reminded Charlsie Fleeger to be prepared with any vital sign and/or heart rhythm information that could potentially be obtained via home monitoring, at the time of her visit. I reminded Skylin Kennerson to expect a phone call at the time of her visit if her visit.  Patient agrees to consent below.  Cleon Gustin, RN 06/09/2018 4:26 PM   FULL LENGTH CONSENT FOR TELE-HEALTH VISIT   I hereby voluntarily request, consent and authorize [facility] and its employed or contracted physicians, physician assistants, nurse practitioners or other licensed health care professionals (the Practitioner), to provide me with telemedicine health care services (the "Services") as deemed necessary by the treating Practitioner. I acknowledge and consent to receive the Services by the Practitioner via telemedicine. I understand that the telemedicine visit will involve communicating with the Practitioner through live audiovisual communication technology and the disclosure of certain medical information by electronic transmission. I acknowledge that I have been given the opportunity to request an in-person assessment or other available alternative prior to the telemedicine visit and am voluntarily participating in the telemedicine visit.  I understand that I have the right to withhold or withdraw my consent to the use of telemedicine in the course of my care at any time, without affecting my right to future care or treatment, and that the Practitioner or I may terminate the telemedicine visit at any time. I understand that I have the right to inspect all  information obtained and/or recorded in the course of the telemedicine visit and may receive copies of available information for a reasonable fee.  I understand that some of the potential risks of receiving the Services via telemedicine include:  Marland Kitchen Delay or interruption in medical evaluation due to technological equipment failure or disruption; . Information transmitted may not be sufficient (e.g. poor resolution of images) to allow for appropriate medical decision making by the Practitioner; and/or  . In rare instances, security protocols could fail, causing a breach of personal health information.  Furthermore, I acknowledge that it is my responsibility to provide information about my medical history, conditions and care that is complete and accurate to the best of my ability. I acknowledge that Practitioner's advice, recommendations, and/or decision may be based on factors not within their control, such as incomplete or inaccurate data provided by me or distortions of diagnostic images or specimens that may result from electronic transmissions. I understand that the practice of medicine is not an exact science and that Practitioner makes no warranties or guarantees regarding treatment outcomes. I acknowledge that I will receive a copy of this consent concurrently upon execution via email to the email address I last provided but may also request a printed copy by calling the office of [facility].    I understand that my insurance will be billed for this visit.   I have read or had this consent read to me. . I understand the contents of this consent, which adequately explains the benefits and risks of the Services being provided via telemedicine.  . I have been provided ample opportunity to ask questions regarding this consent and the Services and have had my questions answered to my satisfaction. Marland Kitchen  I give my informed consent for the services to be provided through the use of telemedicine in my  medical care  By participating in this telemedicine visit I agree to the above.

## 2018-06-10 DIAGNOSIS — M25511 Pain in right shoulder: Secondary | ICD-10-CM | POA: Diagnosis not present
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
## 2018-06-17 ENCOUNTER — Other Ambulatory Visit: Payer: Self-pay

## 2018-06-17 ENCOUNTER — Encounter: Payer: Self-pay | Admitting: Physician Assistant

## 2018-06-17 ENCOUNTER — Telehealth (INDEPENDENT_AMBULATORY_CARE_PROVIDER_SITE_OTHER): Payer: Medicare Other | Admitting: Physician Assistant

## 2018-06-17 VITALS — Ht 63.0 in | Wt 118.0 lb

## 2018-06-17 DIAGNOSIS — I201 Angina pectoris with documented spasm: Secondary | ICD-10-CM

## 2018-06-17 DIAGNOSIS — R600 Localized edema: Secondary | ICD-10-CM

## 2018-06-17 DIAGNOSIS — R5383 Other fatigue: Secondary | ICD-10-CM

## 2018-06-17 NOTE — Patient Instructions (Addendum)
Medication Instructions:  Your physician recommends that you continue on your current medications as directed. Please refer to the Current Medication list given to you today.  If you need a refill on your cardiac medications before your next appointment, please call your pharmacy.   Lab work: Your physician recommends that you return for a FASTING labs: CBC, CMET, LIPIDS, B12, and TSH in June  If you have labs (blood work) drawn today and your tests are completely normal, you will receive your results only by: Marland Kitchen MyChart Message (if you have MyChart) OR . A paper copy in the mail If you have any lab test that is abnormal or we need to change your treatment, we will call you to review the results.  Testing/Procedures: None ordered  Follow-Up: At [facility], you and your health needs are our priority.  As part of our continuing mission to provide you with exceptional heart care, we have created designated Provider Care Teams.  These Care Teams include your primary Cardiologist (physician) and Advanced Practice Providers (APPs -  Physician Assistants and Nurse Practitioners) who all work together to provide you with the care you need, when you need it. . You will need a follow up appointment in 1 year.  Please call our office 2 months in advance to schedule this appointment.  You may see Ena Dawley, MD or one of the following Advanced Practice Providers on your designated Care Team:   . Lyda Jester, PA-C . Dayna Dunn, PA-C . Ermalinda Barrios, PA-C  Any Other Special Instructions Will Be Listed Below (If Applicable).   1. Your provider recommends that you maintain 150 minutes per week of moderate aerobic activity.  2. Limit the amount of salt in your diet to less than 2,000 mg daily  Two Gram Sodium Diet 2000 mg  What is Sodium? Sodium is a mineral found naturally in many foods. The most significant source of sodium in the diet is table salt, which is about 40% sodium.   Processed, convenience, and preserved foods also contain a large amount of sodium.  The body needs only 500 mg of sodium daily to function,  A normal diet provides more than enough sodium even if you do not use salt.  Why Limit Sodium? A build up of sodium in the body can cause thirst, increased blood pressure, shortness of breath, and water retention.  Decreasing sodium in the diet can reduce edema and risk of heart attack or stroke associated with high blood pressure.  Keep in mind that there are many other factors involved in these health problems.  Heredity, obesity, lack of exercise, cigarette smoking, stress and what you eat all play a role.  General Guidelines:  Do not add salt at the table or in cooking.  One teaspoon of salt contains over 2 grams of sodium.  Read food labels  Avoid processed and convenience foods  Ask your dietitian before eating any foods not dicussed in the menu planning guidelines  Consult your physician if you wish to use a salt substitute or a sodium containing medication such as antacids.  Limit milk and milk products to 16 oz (2 cups) per day.  Shopping Hints:  READ LABELS!! "Dietetic" does not necessarily mean low sodium.  Salt and other sodium ingredients are often added to foods during processing.   Menu Planning Guidelines Food Group Choose More Often Avoid  Beverages (see also the milk group All fruit juices, low-sodium, salt-free vegetables juices, low-sodium carbonated beverages Regular vegetable or  tomato juices, commercially softened water used for drinking or cooking  Breads and Cereals Enriched white, wheat, rye and pumpernickel bread, hard rolls and dinner rolls; muffins, cornbread and waffles; most dry cereals, cooked cereal without added salt; unsalted crackers and breadsticks; low sodium or homemade bread crumbs Bread, rolls and crackers with salted tops; quick breads; instant hot cereals; pancakes; commercial bread stuffing; self-rising  flower and biscuit mixes; regular bread crumbs or cracker crumbs  Desserts and Sweets Desserts and sweets mad with mild should be within allowance Instant pudding mixes and cake mixes  Fats Butter or margarine; vegetable oils; unsalted salad dressings, regular salad dressings limited to 1 Tbs; light, sour and heavy cream Regular salad dressings containing bacon fat, bacon bits, and salt pork; snack dips made with instant soup mixes or processed cheese; salted nuts  Fruits Most fresh, frozen and canned fruits Fruits processed with salt or sodium-containing ingredient (some dried fruits are processed with sodium sulfites        Vegetables Fresh, frozen vegetables and low- sodium canned vegetables Regular canned vegetables, sauerkraut, pickled vegetables, and others prepared in brine; frozen vegetables in sauces; vegetables seasoned with ham, bacon or salt pork  Condiments, Sauces, Miscellaneous  Salt substitute with physician's approval; pepper, herbs, spices; vinegar, lemon or lime juice; hot pepper sauce; garlic powder, onion powder, low sodium soy sauce (1 Tbs.); low sodium condiments (ketchup, chili sauce, mustard) in limited amounts (1 tsp.) fresh ground horseradish; unsalted tortilla chips, pretzels, potato chips, popcorn, salsa (1/4 cup) Any seasoning made with salt including garlic salt, celery salt, onion salt, and seasoned salt; sea salt, rock salt, kosher salt; meat tenderizers; monosodium glutamate; mustard, regular soy sauce, barbecue, sauce, chili sauce, teriyaki sauce, steak sauce, Worcestershire sauce, and most flavored vinegars; canned gravy and mixes; regular condiments; salted snack foods, olives, picles, relish, horseradish sauce, catsup   Food preparation: Try these seasonings Meats:    Pork Sage, onion Serve with applesauce  Chicken Poultry seasoning, thyme, parsley Serve with cranberry sauce  Lamb Curry powder, rosemary, garlic, thyme Serve with mint sauce or jelly  Veal  Marjoram, basil Serve with current jelly, cranberry sauce  Beef Pepper, bay leaf Serve with dry mustard, unsalted chive butter  Fish [place] leaf, dill Serve with unsalted lemon butter, unsalted parsley butter  Vegetables:    Asparagus Lemon juice   Broccoli Lemon juice   Carrots Mustard dressing parsley, mint, nutmeg, glazed with unsalted butter and sugar   Green beans Marjoram, lemon juice, nutmeg,dill seed   Tomatoes Basil, marjoram, onion   Spice /blend for [employer]" 4 tsp ground thyme 1 tsp ground sage 3 tsp ground rosemary 4 tsp ground marjoram   Test your knowledge 1. A product that says "Salt Free" may still contain sodium. True or False 2. Garlic Powder and Hot Pepper Sauce an be used as alternative seasonings.True or False 3. Processed foods have more sodium than fresh foods.  True or False 4. Canned Vegetables have less sodium than froze True or False  WAYS TO DECREASE YOUR SODIUM INTAKE 1. Avoid the use of added salt in cooking and at the table.  Table salt (and other prepared seasonings which contain salt) is probably one of the greatest sources of sodium in the diet.  Unsalted foods can gain flavor from the sweet, sour, and butter taste sensations of herbs and spices.  Instead of using salt for seasoning, try the following seasonings with the foods listed.  Remember: how you use them to enhance natural  food flavors is limited only by your creativity... Allspice-Meat, fish, eggs, fruit, peas, red and yellow vegetables Almond Extract-Fruit baked goods Anise Seed-Sweet breads, fruit, carrots, beets, cottage cheese, cookies (tastes like licorice) Basil-Meat, fish, eggs, vegetables, rice, vegetables salads, soups, sauces Bay Leaf-Meat, fish, stews, poultry Burnet-Salad, vegetables (cucumber-like flavor) Caraway Seed-Bread, cookies, cottage cheese, meat, vegetables, cheese, rice Cardamon-Baked goods, fruit, soups Celery Powder or seed-Salads, salad dressings, sauces, meatloaf,  soup, bread.Do not use  celery salt Chervil-Meats, salads, fish, eggs, vegetables, cottage cheese (parsley-like flavor) Chili Power-Meatloaf, chicken cheese, corn, eggplant, egg dishes Chives-Salads cottage cheese, egg dishes, soups, vegetables, sauces Cilantro-Salsa, casseroles Cinnamon-Baked goods, fruit, pork, lamb, chicken, carrots Cloves-Fruit, baked goods, fish, pot roast, green beans, beets, carrots Coriander-Pastry, cookies, meat, salads, cheese (lemon-orange flavor) Cumin-Meatloaf, fish,cheese, eggs, cabbage,fruit pie (caraway flavor) [employer], fruit, eggs, fish, poultry, cottage cheese, vegetables Dill Seed-Meat, cottage cheese, poultry, vegetables, fish, salads, bread Fennel Seed-Bread, cookies, apples, pork, eggs, fish, beets, cabbage, cheese, Licorice-like flavor Garlic-(buds or powder) Salads, meat, poultry, fish, bread, butter, vegetables, potatoes.Do not  use garlic salt Ginger-Fruit, vegetables, baked goods, meat, fish, poultry Horseradish Root-Meet, vegetables, butter Lemon Juice or Extract-Vegetables, fruit, tea, baked goods, fish salads Mace-Baked goods fruit, vegetables, fish, poultry (taste like nutmeg) Maple Extract-Syrups Marjoram-Meat, chicken, fish, vegetables, breads, green salads (taste like Sage) Mint-Tea, lamb, sherbet, vegetables, desserts, carrots, cabbage Mustard, Dry or Seed-Cheese, eggs, meats, vegetables, poultry Nutmeg-Baked goods, fruit, chicken, eggs, vegetables, desserts Onion Powder-Meat, fish, poultry, vegetables, cheese, eggs, bread, rice salads (Do not use   Onion salt) Orange Extract-Desserts, baked goods Oregano-Pasta, eggs, cheese, onions, pork, lamb, fish, chicken, vegetables, green salads Paprika-Meat, fish, poultry, eggs, cheese, vegetables Parsley Flakes-Butter, vegetables, meat fish, poultry, eggs, bread, salads (certain forms may   Contain sodium Pepper-Meat fish, poultry, vegetables, eggs Peppermint Extract-Desserts, baked  goods Poppy Seed-Eggs, bread, cheese, fruit dressings, baked goods, noodles, vegetables, cottage  [employer], poultry, meat, fish, cauliflower, turnips,eggs bread Saffron-Rice, bread, veal, chicken, fish, eggs Sage-Meat, fish, poultry, onions, eggplant, tomateos, pork, stews Savory-Eggs, salads, poultry, meat, rice, vegetables, soups, pork Tarragon-Meat, poultry, fish, eggs, butter, vegetables (licorice-like flavor)  Thyme-Meat, poultry, fish, eggs, vegetables, (clover-like flavor), sauces, soups Tumeric-Salads, butter, eggs, fish, rice, vegetables (saffron-like flavor) Vanilla Extract-Baked goods, candy Vinegar-Salads, vegetables, meat marinades Walnut Extract-baked goods, candy  2. Choose your Foods Wisely   The following is a list of foods to avoid which are high in sodium:  Meats-Avoid all smoked, canned, salt cured, dried and kosher meat and fish as well as Anchovies   [employer] meats:Bologna, Liverwurst, Pastrami Canned meat or fish  Marinated herring Caviar    Pepperoni Corned Beef   Pizza Dried chipped beef  Salami Frozen breaded fish or meat Salt pork Frankfurters or hot dogs  Sardines Gefilte fish   Sausage Ham (boiled ham, Proscuitto Smoked butt    spiced ham)   Spam      TV Dinners Vegetables Canned vegetables (Regular) Relish Canned mushrooms  Sauerkraut Olives    Tomato juice Pickles  Bakery and Dessert Products Canned puddings  Cream pies Cheesecake   Decorated cakes Cookies  Beverages/Juices Tomato juice, regular  Gatorade   V-8 vegetable juice, regular  Breads and Cereals Biscuit mixes   Salted potato chips, corn chips, pretzels Bread stuffing mixes  Salted crackers and rolls Pancake and waffle mixes Self-rising flour  Seasonings Accent    Meat sauces Barbecue sauce  Meat tenderizer Catsup    Monosodium glutamate (MSG) Celery salt  Onion salt Chili sauce   Prepared mustard Garlic  salt   Salt, seasoned salt, sea salt Gravy mixes   Soy sauce Horseradish   Steak sauce Ketchup   Tartar sauce Lite salt    Teriyaki sauce Marinade mixes   Worcestershire sauce  Others Baking powder   Cocoa and cocoa mixes Baking soda   Commercial casserole mixes Candy-caramels, chocolate  Dehydrated soups    Bars, fudge,nougats  Instant rice and pasta mixes Canned broth or soup  Maraschino cherries Cheese, aged and processed cheese and cheese spreads  Learning Assessment Quiz  Indicated T (for True) or F (for False) for each of the following statements:  1. _____ Fresh fruits and vegetables and unprocessed grains are generally low in sodium 2. _____ Water may contain a considerable amount of sodium, depending on the source 3. _____ You can always tell if a food is high in sodium by tasting it 4. _____ Certain laxatives my be high in sodium and should be avoided unless prescribed   by a physician or pharmacist 5. _____ Salt substitutes may be used freely by anyone on a sodium restricted diet 6. _____ Sodium is present in table salt, food additives and as a natural component of   most foods 7. _____ Table salt is approximately 90% sodium 8. _____ Limiting sodium intake may help prevent excess fluid accumulation in the body 9. _____ On a sodium-restricted diet, seasonings such as bouillon soy sauce, and    cooking wine should be used in place of table salt 10. _____ On an ingredient list, a product which lists monosodium glutamate as the first   ingredient is an appropriate food to include on a low sodium diet  Circle the best answer(s) to the following statements (Hint: there may be more than one correct answer)  11. On a low-sodium diet, some acceptable snack items are:    A. Olives  F. Bean dip   K. Grapefruit juice    B. Salted Pretzels G. Commercial Popcorn   L. Canned peaches    C. Carrot Sticks  H. Bouillon   M. Unsalted nuts   D. [place] fries  I. Peanut butter crackers N.  Salami   E. Sweet pickles J. Tomato Juice   O. Pizza  12.  Seasonings that may be used freely on a reduced - sodium diet include   A. Lemon wedges F.Monosodium glutamate K. Celery seed    B.Soysauce   G. Pepper   L. Mustard powder   C. Sea salt  H. Cooking wine  M. Onion flakes   D. Vinegar  E. Prepared horseradish N. Salsa   E. Sage   J. Worcestershire sauce  O. Chutney

## 2018-06-22 ENCOUNTER — Telehealth: Payer: Self-pay

## 2018-06-22 NOTE — Telephone Encounter (Signed)
LVM requesting patient call back to schedule a nurse visit to get Prolia injection-she owes $0 and is ready to be scheduled now

## 2018-06-23 ENCOUNTER — Ambulatory Visit: Payer: Medicare Other

## 2018-06-23 ENCOUNTER — Telehealth: Payer: Self-pay | Admitting: Internal Medicine

## 2018-06-23 NOTE — Telephone Encounter (Signed)
Patient would like to move prolia injection to Thursday, please advise if this is ok.

## 2018-06-26 ENCOUNTER — Ambulatory Visit: Payer: Medicare Other

## 2018-07-01 ENCOUNTER — Other Ambulatory Visit: Payer: Self-pay | Admitting: Nurse Practitioner

## 2018-07-01 DIAGNOSIS — Z1231 Encounter for screening mammogram for malignant neoplasm of breast: Secondary | ICD-10-CM

## 2018-07-02 ENCOUNTER — Ambulatory Visit (INDEPENDENT_AMBULATORY_CARE_PROVIDER_SITE_OTHER): Payer: Medicare Other

## 2018-07-02 ENCOUNTER — Other Ambulatory Visit: Payer: Self-pay

## 2018-07-02 DIAGNOSIS — M810 Age-related osteoporosis without current pathological fracture: Secondary | ICD-10-CM | POA: Diagnosis not present

## 2018-07-02 MED ORDER — DENOSUMAB 60 MG/ML ~~LOC~~ SOSY
60.0000 mg | PREFILLED_SYRINGE | Freq: Once | SUBCUTANEOUS | Status: AC
  Administered 2018-07-02: 60 mg via SUBCUTANEOUS

## 2018-07-02 NOTE — Progress Notes (Signed)
Per orders of Dr. Philemon Kingdom injection of Prolia 60mg /mL given today by N.Adonnis Salceda,LPN. Patient tolerated injection well.

## 2018-07-02 NOTE — Telephone Encounter (Signed)
Patient got Prolia today 

## 2018-07-14 ENCOUNTER — Ambulatory Visit (INDEPENDENT_AMBULATORY_CARE_PROVIDER_SITE_OTHER): Payer: Medicare Other | Admitting: Internal Medicine

## 2018-07-14 ENCOUNTER — Encounter: Payer: Self-pay | Admitting: Internal Medicine

## 2018-07-14 DIAGNOSIS — Z20822 Contact with and (suspected) exposure to covid-19: Secondary | ICD-10-CM | POA: Insufficient documentation

## 2018-07-14 DIAGNOSIS — R6889 Other general symptoms and signs: Secondary | ICD-10-CM

## 2018-07-14 DIAGNOSIS — I201 Angina pectoris with documented spasm: Secondary | ICD-10-CM | POA: Diagnosis not present

## 2018-07-14 MED ORDER — BENZONATATE 200 MG PO CAPS: 200.0000 mg | ORAL_CAPSULE | Freq: Three times a day (TID) | ORAL | 0 refills | Status: DC | PRN

## 2018-07-14 NOTE — Progress Notes (Signed)
Virtual Visit via Video Note  I connected with Bethany Carter on 07/14/18 at 11:00 AM EDT by a video enabled telemedicine application and verified that I am speaking with the correct person using two identifiers.  The patient and the provider were at separate locations throughout the entire encounter.   I discussed the limitations of evaluation and management by telemedicine and the availability of in person appointments. The patient expressed understanding and agreed to proceed.  History of Present Illness: The patient is a 68 y.o. female with visit for new cough and green sputum. Started about 2 weeks ago and overall worsening slightly. She has had a few muscle aches and headaches but thought that the headaches were related to weather. Has allergies and taking zyrtec daily but not having any problems with allergies at this time. Denies running nose, sinus pressure, sinus pain. Denies ear pain. Denies fevers or chills. Has been working still. Denies taking anything for this yet except mucinex she tried without success.   Observations/Objective: Appearance: normal, breathing appears normal, casual grooming, abdomen does not appear distended, throat not visualized, mental status is A and O times 3  Assessment and Plan: See problem oriented charting  Follow Up Instructions: referred to health department for covid-19 testing. Given information on quarantining while testing is undergoing.  I discussed the assessment and treatment plan with the patient. The patient was provided an opportunity to ask questions and all were answered. The patient agreed with the plan and demonstrated an understanding of the instructions.   The patient was advised to call back or seek an in-person evaluation if the symptoms worsen or if the condition fails to improve as anticipated.  Hoyt Koch, MD

## 2018-07-14 NOTE — Assessment & Plan Note (Signed)
Concern that this could represent covid-19 and advised to get testing through health department. Rx for tessalon perles. Given information about quarantining until results return.

## 2018-07-16 ENCOUNTER — Telehealth: Payer: Self-pay

## 2018-07-16 DIAGNOSIS — Z20828 Contact with and (suspected) exposure to other viral communicable diseases: Secondary | ICD-10-CM | POA: Diagnosis not present

## 2018-07-16 NOTE — Telephone Encounter (Signed)
Did she call health department to get tested as instructed?

## 2018-07-16 NOTE — Telephone Encounter (Signed)
Until getting results she is under quarantine.

## 2018-07-16 NOTE — Telephone Encounter (Signed)
Patient informed of Md response and stated understanding  

## 2018-07-16 NOTE — Telephone Encounter (Signed)
Patient states she was tested today was told will get the results in 7-10 days. Informed patient we probably are going to wait to do anything else until those results come back and to let us know when she gets the results. Patient stated understanding.

## 2018-07-16 NOTE — Telephone Encounter (Signed)
Called patient to inform her that unfortunately with her symptoms we cannot be 100% it is not covid related so we cannot do an in office visit.  Patient States that the pearls have helped a little with the cough but she did have a horrible coughing fit last night, happened two separate times. Noticed wheezing this morning, but states that it comes and goes was way worse this morning not as bad now. Patient is just concerned it could be bronchitis and was wondering if there is any way to get a chest x-ray or what your thought are.

## 2018-07-16 NOTE — Telephone Encounter (Signed)
Copied from [place] [id number]. Topic: General - Other >> Jul 16, 2018 10:32 AM Ivar Drape wrote: Reason for CRM:  Patient thinks she has Bronchitis instead of the Corona Virus and she wants to know if she can come in the practice for an appt.  Please advise.

## 2018-07-27 ENCOUNTER — Telehealth: Payer: Self-pay | Admitting: *Deleted

## 2018-07-27 NOTE — Telephone Encounter (Signed)
Patient states that she is getting better, but she is still having some symptoms slight cough is still taking the pearls and sometimes nasal congestion. Patient is having wheezing daughter in-law is a NP and listened to her. Patient was unsure what you wanted to do about the wheezing?

## 2018-07-27 NOTE — Telephone Encounter (Signed)
Copied from [place] [id number]. Topic: General - Other >> Jul 24, 2018  2:45 PM Rainey Pines A wrote: Patient stated that she was instructed by her provider to call and notify them of her COVID test results. Patient stated that the results were negative.

## 2018-07-27 NOTE — Telephone Encounter (Signed)
If improving I would just monitor for now. If SOB or worsening can come for visit with someone.

## 2018-07-27 NOTE — Telephone Encounter (Signed)
How is she feeling? Any better?

## 2018-07-27 NOTE — Telephone Encounter (Signed)
Patient informed of MD response and stated understanding  

## 2018-08-05 DIAGNOSIS — M7501 Adhesive capsulitis of right shoulder: Secondary | ICD-10-CM | POA: Diagnosis not present

## 2018-08-06 DIAGNOSIS — M7501 Adhesive capsulitis of right shoulder: Secondary | ICD-10-CM | POA: Diagnosis not present

## 2018-08-06 DIAGNOSIS — M5412 Radiculopathy, cervical region: Secondary | ICD-10-CM | POA: Diagnosis not present

## 2018-08-07 ENCOUNTER — Telehealth: Payer: Self-pay

## 2018-08-07 NOTE — Telephone Encounter (Signed)
    COVID-19 Pre-Screening Questions:  . In the past 7 to 10 days have you had a cough,  shortness of breath, headache, congestion, fever (100 or greater) body aches, chills, sore throat, or sudden loss of taste or sense of smell? . Have you been around anyone with known Covid 19. . Have you been around anyone who is awaiting Covid 19 test results in the past 7 to 10 days? . Have you been around anyone who has been exposed to Covid 19, or has mentioned symptoms of Covid 19 within the past 7 to 10 days?  If you have any concerns/questions about symptoms patients report during screening (either on the phone or at threshold). Contact the provider seeing the patient or DOD for further guidance.  If neither are available contact a member of the leadership team.          Pt answered NO to all pre-screening questions. klb 1000 08/07/2018

## 2018-08-10 ENCOUNTER — Other Ambulatory Visit: Payer: Self-pay

## 2018-08-10 ENCOUNTER — Other Ambulatory Visit: Payer: Medicare Other | Admitting: *Deleted

## 2018-08-10 DIAGNOSIS — D519 Vitamin B12 deficiency anemia, unspecified: Secondary | ICD-10-CM | POA: Diagnosis not present

## 2018-08-10 DIAGNOSIS — I201 Angina pectoris with documented spasm: Secondary | ICD-10-CM

## 2018-08-10 DIAGNOSIS — R600 Localized edema: Secondary | ICD-10-CM | POA: Diagnosis not present

## 2018-08-10 DIAGNOSIS — R5383 Other fatigue: Secondary | ICD-10-CM

## 2018-08-11 DIAGNOSIS — M5412 Radiculopathy, cervical region: Secondary | ICD-10-CM | POA: Diagnosis not present

## 2018-08-11 DIAGNOSIS — M7501 Adhesive capsulitis of right shoulder: Secondary | ICD-10-CM | POA: Diagnosis not present

## 2018-08-11 LAB — COMPREHENSIVE METABOLIC PANEL
ALT: 11 IU/L (ref 0–32)
AST: 11 IU/L (ref 0–40)
Albumin/Globulin Ratio: 1.9 (ref 1.2–2.2)
Albumin: 4 g/dL (ref 3.8–4.8)
Alkaline Phosphatase: 73 IU/L (ref 39–117)
BUN/Creatinine Ratio: 21 (ref 12–28)
BUN: 16 mg/dL (ref 8–27)
Bilirubin Total: 0.4 mg/dL (ref 0.0–1.2)
CO2: 26 mmol/L (ref 20–29)
Calcium: 9.1 mg/dL (ref 8.7–10.3)
Chloride: 103 mmol/L (ref 96–106)
Creatinine, Ser: 0.75 mg/dL (ref 0.57–1.00)
GFR calc Af Amer: 95 mL/min/{1.73_m2} (ref >59)
GFR calc non Af Amer: 82 mL/min/{1.73_m2} (ref >59)
Globulin, Total: 2.1 g/dL (ref 1.5–4.5)
Glucose: 87 mg/dL (ref 65–99)
Potassium: 4.1 mmol/L (ref 3.5–5.2)
Sodium: 141 mmol/L (ref 134–144)
Total Protein: 6.1 g/dL (ref 6.0–8.5)

## 2018-08-11 LAB — CBC
Hematocrit: 37.1 % (ref 34.0–46.6)
Hemoglobin: 12.8 g/dL (ref 11.1–15.9)
MCH: 30.7 pg (ref 26.6–33.0)
MCHC: 34.5 g/dL (ref 31.5–35.7)
MCV: 89 fL (ref 79–97)
Platelets: 228 10*3/uL (ref 150–450)
RBC: 4.17 x10E6/uL (ref 3.77–5.28)
RDW: 12.2 % (ref 11.7–15.4)
WBC: 5.2 10*3/uL (ref 3.4–10.8)

## 2018-08-11 LAB — LIPID PANEL
Chol/HDL Ratio: 2.7 ratio (ref 0.0–4.4)
Cholesterol, Total: 159 mg/dL (ref 100–199)
HDL: 59 mg/dL (ref >39)
LDL Calculated: 80 mg/dL (ref 0–99)
Triglycerides: 102 mg/dL (ref 0–149)
VLDL Cholesterol Cal: 20 mg/dL (ref 5–40)

## 2018-08-11 LAB — TSH: TSH: 1.35 u[IU]/mL (ref 0.450–4.500)

## 2018-08-11 LAB — VITAMIN B12: Vitamin B-12: 365 pg/mL (ref 232–1245)

## 2018-08-14 DIAGNOSIS — M5412 Radiculopathy, cervical region: Secondary | ICD-10-CM | POA: Diagnosis not present

## 2018-08-14 DIAGNOSIS — M7501 Adhesive capsulitis of right shoulder: Secondary | ICD-10-CM | POA: Diagnosis not present

## 2018-08-19 DIAGNOSIS — M7501 Adhesive capsulitis of right shoulder: Secondary | ICD-10-CM | POA: Diagnosis not present

## 2018-08-19 DIAGNOSIS — M5412 Radiculopathy, cervical region: Secondary | ICD-10-CM | POA: Diagnosis not present

## 2018-08-20 DIAGNOSIS — M7501 Adhesive capsulitis of right shoulder: Secondary | ICD-10-CM | POA: Diagnosis not present

## 2018-08-20 DIAGNOSIS — M5412 Radiculopathy, cervical region: Secondary | ICD-10-CM | POA: Diagnosis not present

## 2018-08-24 DIAGNOSIS — M5412 Radiculopathy, cervical region: Secondary | ICD-10-CM | POA: Diagnosis not present

## 2018-08-24 DIAGNOSIS — M7501 Adhesive capsulitis of right shoulder: Secondary | ICD-10-CM | POA: Diagnosis not present

## 2018-08-25 ENCOUNTER — Other Ambulatory Visit: Payer: Self-pay

## 2018-08-25 ENCOUNTER — Ambulatory Visit
Admission: RE | Admit: 2018-08-25 | Discharge: 2018-08-25 | Disposition: A | Discharge status: Home / Self Care | Payer: Medicare Other | Source: Ambulatory Visit | Attending: Obstetrics and Gynecology | Admitting: Obstetrics and Gynecology

## 2018-08-25 DIAGNOSIS — M7501 Adhesive capsulitis of right shoulder: Secondary | ICD-10-CM | POA: Diagnosis not present

## 2018-08-25 DIAGNOSIS — Z1231 Encounter for screening mammogram for malignant neoplasm of breast: Secondary | ICD-10-CM | POA: Diagnosis not present

## 2018-08-25 DIAGNOSIS — M5412 Radiculopathy, cervical region: Secondary | ICD-10-CM | POA: Diagnosis not present

## 2018-08-26 DIAGNOSIS — M5412 Radiculopathy, cervical region: Secondary | ICD-10-CM | POA: Diagnosis not present

## 2018-08-26 DIAGNOSIS — M7501 Adhesive capsulitis of right shoulder: Secondary | ICD-10-CM | POA: Diagnosis not present

## 2018-08-31 DIAGNOSIS — M5412 Radiculopathy, cervical region: Secondary | ICD-10-CM | POA: Diagnosis not present

## 2018-08-31 DIAGNOSIS — M7501 Adhesive capsulitis of right shoulder: Secondary | ICD-10-CM | POA: Diagnosis not present

## 2018-09-01 DIAGNOSIS — M7501 Adhesive capsulitis of right shoulder: Secondary | ICD-10-CM | POA: Diagnosis not present

## 2018-09-01 DIAGNOSIS — M5412 Radiculopathy, cervical region: Secondary | ICD-10-CM | POA: Diagnosis not present

## 2018-09-02 DIAGNOSIS — M7501 Adhesive capsulitis of right shoulder: Secondary | ICD-10-CM | POA: Diagnosis not present

## 2018-09-03 DIAGNOSIS — M5412 Radiculopathy, cervical region: Secondary | ICD-10-CM | POA: Diagnosis not present

## 2018-09-03 DIAGNOSIS — M7501 Adhesive capsulitis of right shoulder: Secondary | ICD-10-CM | POA: Diagnosis not present

## 2018-09-07 DIAGNOSIS — M5412 Radiculopathy, cervical region: Secondary | ICD-10-CM | POA: Diagnosis not present

## 2018-09-07 DIAGNOSIS — M7501 Adhesive capsulitis of right shoulder: Secondary | ICD-10-CM | POA: Diagnosis not present

## 2018-09-08 DIAGNOSIS — M5412 Radiculopathy, cervical region: Secondary | ICD-10-CM | POA: Diagnosis not present

## 2018-09-08 DIAGNOSIS — M7501 Adhesive capsulitis of right shoulder: Secondary | ICD-10-CM | POA: Diagnosis not present

## 2018-09-10 DIAGNOSIS — M7501 Adhesive capsulitis of right shoulder: Secondary | ICD-10-CM | POA: Diagnosis not present

## 2018-09-10 DIAGNOSIS — M5412 Radiculopathy, cervical region: Secondary | ICD-10-CM | POA: Diagnosis not present

## 2018-09-14 DIAGNOSIS — M7501 Adhesive capsulitis of right shoulder: Secondary | ICD-10-CM | POA: Diagnosis not present

## 2018-09-14 DIAGNOSIS — M5412 Radiculopathy, cervical region: Secondary | ICD-10-CM | POA: Diagnosis not present

## 2018-09-17 DIAGNOSIS — M5412 Radiculopathy, cervical region: Secondary | ICD-10-CM | POA: Diagnosis not present

## 2018-09-17 DIAGNOSIS — M7501 Adhesive capsulitis of right shoulder: Secondary | ICD-10-CM | POA: Diagnosis not present

## 2018-09-21 DIAGNOSIS — M7501 Adhesive capsulitis of right shoulder: Secondary | ICD-10-CM | POA: Diagnosis not present

## 2018-09-21 DIAGNOSIS — M5412 Radiculopathy, cervical region: Secondary | ICD-10-CM | POA: Diagnosis not present

## 2018-09-28 ENCOUNTER — Other Ambulatory Visit: Payer: Self-pay | Admitting: Cardiology

## 2018-12-09 ENCOUNTER — Ambulatory Visit (INDEPENDENT_AMBULATORY_CARE_PROVIDER_SITE_OTHER): Payer: Medicare Other

## 2018-12-09 ENCOUNTER — Other Ambulatory Visit: Payer: Self-pay

## 2018-12-09 DIAGNOSIS — Z23 Encounter for immunization: Secondary | ICD-10-CM

## 2018-12-22 ENCOUNTER — Ambulatory Visit (INDEPENDENT_AMBULATORY_CARE_PROVIDER_SITE_OTHER): Payer: Medicare Other | Admitting: Nurse Practitioner

## 2018-12-22 ENCOUNTER — Encounter: Payer: Self-pay | Admitting: Nurse Practitioner

## 2018-12-22 ENCOUNTER — Other Ambulatory Visit: Payer: Self-pay

## 2018-12-22 ENCOUNTER — Other Ambulatory Visit (INDEPENDENT_AMBULATORY_CARE_PROVIDER_SITE_OTHER): Payer: Medicare Other

## 2018-12-22 VITALS — BP 112/74 | HR 93 | Temp 98.0°F | Ht 62.5 in | Wt 123.0 lb

## 2018-12-22 DIAGNOSIS — R1013 Epigastric pain: Secondary | ICD-10-CM

## 2018-12-22 DIAGNOSIS — R110 Nausea: Secondary | ICD-10-CM

## 2018-12-22 DIAGNOSIS — I201 Angina pectoris with documented spasm: Secondary | ICD-10-CM | POA: Diagnosis not present

## 2018-12-22 LAB — COMPREHENSIVE METABOLIC PANEL
ALT: 9 U/L (ref 0–35)
AST: 16 U/L (ref 0–37)
Albumin: 4.3 g/dL (ref 3.5–5.2)
Alkaline Phosphatase: 54 U/L (ref 39–117)
BUN: 14 mg/dL (ref 6–23)
CO2: 33 mEq/L — ABNORMAL HIGH (ref 19–32)
Calcium: 9.2 mg/dL (ref 8.4–10.5)
Chloride: 101 mEq/L (ref 96–112)
Creatinine, Ser: 0.63 mg/dL (ref 0.40–1.20)
GFR: 93.87 mL/min (ref >60.00)
Glucose, Bld: 97 mg/dL (ref 70–99)
Potassium: 3.8 mEq/L (ref 3.5–5.1)
Sodium: 139 mEq/L (ref 135–145)
Total Bilirubin: 0.3 mg/dL (ref 0.2–1.2)
Total Protein: 6.7 g/dL (ref 6.0–8.3)

## 2018-12-22 LAB — CBC
HCT: 39.9 % (ref 36.0–46.0)
Hemoglobin: 13.3 g/dL (ref 12.0–15.0)
MCHC: 33.3 g/dL (ref 30.0–36.0)
MCV: 88.5 fl (ref 78.0–100.0)
Platelets: 199 10*3/uL (ref 150.0–400.0)
RBC: 4.51 Mil/uL (ref 3.87–5.11)
RDW: 13.1 % (ref 11.5–15.5)
WBC: 6 10*3/uL (ref 4.0–10.5)

## 2018-12-22 LAB — LIPASE: Lipase: 27 U/L (ref 11.0–59.0)

## 2018-12-22 MED ORDER — FAMOTIDINE 40 MG PO TABS: 40.0000 mg | ORAL_TABLET | Freq: Every day | ORAL | 1 refills | Status: DC

## 2018-12-22 NOTE — Patient Instructions (Addendum)
If you are age 68 or older, your body mass index should be between 23-30. Your Body mass index is 22.14 kg/m. If this is out of the aforementioned range listed, please consider follow up with your Primary Care Provider.  If you are age 53 or younger, your body mass index should be between 19-25. Your Body mass index is 22.14 kg/m. If this is out of the aformentioned range listed, please consider follow up with your Primary Care Provider.   We have sent the following medications to your pharmacy for you to pick up at your convenience:  pepcid 40 mg--DO NOT START THIS [place] TEST  Your provider has requested that you go to the basement level for lab work before leaving today. Press "B" on the elevator. The lab is located at the first door on the left as you exit the elevator.  Please contact the office when you have been on Pepcid for 2 weeks to give [place] an update on your progress. If you are not better we will schedule an Endoscopy.  Thank you for choosing me and [facility]  Tye Savoy, NP
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
## 2018-12-23 ENCOUNTER — Other Ambulatory Visit: Payer: Medicare Other

## 2018-12-23 DIAGNOSIS — R110 Nausea: Secondary | ICD-10-CM | POA: Diagnosis not present

## 2018-12-23 DIAGNOSIS — R1013 Epigastric pain: Secondary | ICD-10-CM

## 2018-12-23 NOTE — Progress Notes (Signed)
I agree with the above notek plan

## 2018-12-24 LAB — HELICOBACTER PYLORI  SPECIAL ANTIGEN
MICRO NUMBER:: 1040259
SPECIMEN QUALITY: ADEQUATE

## 2019-01-01 ENCOUNTER — Other Ambulatory Visit: Payer: Self-pay

## 2019-01-05 ENCOUNTER — Ambulatory Visit (INDEPENDENT_AMBULATORY_CARE_PROVIDER_SITE_OTHER): Payer: Medicare Other

## 2019-01-05 ENCOUNTER — Other Ambulatory Visit: Payer: Self-pay

## 2019-01-05 DIAGNOSIS — M810 Age-related osteoporosis without current pathological fracture: Secondary | ICD-10-CM

## 2019-01-05 MED ORDER — DENOSUMAB 60 MG/ML ~~LOC~~ SOSY
60.0000 mg | PREFILLED_SYRINGE | Freq: Once | SUBCUTANEOUS | Status: AC
  Administered 2019-01-05: 60 mg via SUBCUTANEOUS

## 2019-01-05 NOTE — Progress Notes (Signed)
Per orders of Dr. Gherghe injection of Prolia given today by Melissa, Certified Medical Assistant . Patient tolerated injection well.  

## 2019-01-11 ENCOUNTER — Telehealth: Payer: Self-pay

## 2019-01-11 ENCOUNTER — Telehealth: Payer: Self-pay | Admitting: Nurse Practitioner

## 2019-01-11 NOTE — Telephone Encounter (Signed)
Returned patient's phone call and got voice mail, left message to please call back

## 2019-01-12 NOTE — Telephone Encounter (Signed)
Spoke with the patient again. Difficulty scheduling because she does not have a care partner. She will tentatively plan for pre-visit 01/25/19 and procedure date 02/09/19. If she can find a care partner, she will keep these dates.

## 2019-01-12 NOTE — Telephone Encounter (Signed)
Beth, yes would you mind scheduling her for the EGD.  Thank you

## 2019-01-12 NOTE — Telephone Encounter (Signed)
Spoke with the patient who was seen 12/22/18 for nausea and abdominal discomfort. Reports minimal improvement since starting Pepcid. She has new symptoms of burning, sensation of food sticking when swallowing, and feeling full very quickly.  Plan at the visits states EGD to be considered if no improvement. She would like to go forward with the procedure. Okay to schedule?

## 2019-01-25 ENCOUNTER — Telehealth: Payer: Self-pay | Admitting: Gastroenterology

## 2019-01-25 NOTE — Telephone Encounter (Signed)
Patient called to reschedule her procedure.  Appt rescheduled with her

## 2019-01-27 ENCOUNTER — Other Ambulatory Visit: Payer: Self-pay

## 2019-01-27 ENCOUNTER — Ambulatory Visit (AMBULATORY_SURGERY_CENTER): Payer: Medicare Other | Admitting: *Deleted

## 2019-01-27 VITALS — Temp 96.9°F | Ht 63.0 in | Wt 122.0 lb

## 2019-01-27 DIAGNOSIS — Z1159 Encounter for screening for other viral diseases: Secondary | ICD-10-CM

## 2019-01-27 DIAGNOSIS — R1013 Epigastric pain: Secondary | ICD-10-CM

## 2019-01-27 DIAGNOSIS — R110 Nausea: Secondary | ICD-10-CM

## 2019-01-27 NOTE — Progress Notes (Signed)

## 2019-01-29 ENCOUNTER — Ambulatory Visit (INDEPENDENT_AMBULATORY_CARE_PROVIDER_SITE_OTHER): Payer: Medicare Other

## 2019-01-29 DIAGNOSIS — Z1159 Encounter for screening for other viral diseases: Secondary | ICD-10-CM

## 2019-01-29 LAB — SARS CORONAVIRUS 2 (TAT 6-24 HRS): SARS Coronavirus 2: NEGATIVE

## 2019-02-02 ENCOUNTER — Encounter: Payer: Self-pay | Admitting: Gastroenterology

## 2019-02-02 ENCOUNTER — Ambulatory Visit (AMBULATORY_SURGERY_CENTER): Payer: Medicare Other | Admitting: Gastroenterology

## 2019-02-02 ENCOUNTER — Other Ambulatory Visit: Payer: Self-pay

## 2019-02-02 VITALS — BP 116/63 | HR 69 | Temp 97.6°F | Resp 15 | Ht 63.0 in | Wt 122.0 lb

## 2019-02-02 DIAGNOSIS — R1013 Epigastric pain: Secondary | ICD-10-CM

## 2019-02-02 DIAGNOSIS — K449 Diaphragmatic hernia without obstruction or gangrene: Secondary | ICD-10-CM

## 2019-02-02 DIAGNOSIS — K2950 Unspecified chronic gastritis without bleeding: Secondary | ICD-10-CM

## 2019-02-02 DIAGNOSIS — K219 Gastro-esophageal reflux disease without esophagitis: Secondary | ICD-10-CM | POA: Diagnosis not present

## 2019-02-02 DIAGNOSIS — R110 Nausea: Secondary | ICD-10-CM

## 2019-02-02 MED ORDER — SODIUM CHLORIDE 0.9 % IV SOLN: 500.0000 mL | Freq: Once | INTRAVENOUS | Status: DC

## 2019-02-02 NOTE — Progress Notes (Signed)
VS-CW Temp-LC  Pt's states no medical or surgical changes since previsit or office visit.

## 2019-02-02 NOTE — Progress Notes (Signed)
Called to room to assist during endoscopic procedure.  Patient ID and intended procedure confirmed with present staff. Received instructions for my participation in the procedure from the performing physician.  

## 2019-02-02 NOTE — Op Note (Signed)
[facility]ethany Carter Patient Name: [facility] Procedure Date: 02/02/2019 9:14 AM MRN: [id number] Endoscopist: Milus Banister , MD Age: 68 Referring MD:  Date of Birth: 04/09/1950 Gender: Female Account #: [id number] Procedure:                Upper GI endoscopy Indications:              Epigastric abdominal pain Medicines:                Monitored Anesthesia Care Procedure:                Pre-Anesthesia Assessment:                           - Prior to the procedure, a History and Physical                            was performed, and patient medications and                            allergies were reviewed. The patient's tolerance of                            previous anesthesia was also reviewed. The risks                            and benefits of the procedure and the sedation                            options and risks were discussed with the patient.                            All questions were answered, and informed consent                            was obtained. Prior Anticoagulants: The patient has                            taken no previous anticoagulant or antiplatelet                            agents. ASA Grade Assessment: II - A patient with                            mild systemic disease. After reviewing the risks                            and benefits, the patient was deemed in                            satisfactory condition to undergo the procedure.                           After obtaining informed consent, the endoscope was  passed under direct vision. Throughout the                            procedure, the patient's blood pressure, pulse, and                            oxygen saturations were monitored continuously. The                            Endoscope was introduced through the mouth, and                            advanced to the second part of duodenum. The upper                            GI endoscopy was accomplished  without difficulty.                            The patient tolerated the procedure well. Scope In: Scope Out: Findings:                 Mild inflammation characterized by erythema,                            friability and granularity was found in the gastric                            body and in the gastric antrum. Biopsies were taken                            with a cold forceps for histology.                           A small hiatal hernia was present. Complications:            No immediate complications. Estimated blood loss:                            None. Estimated Blood Loss:     Estimated blood loss: none. Impression:               - Mild distal gastritis, biopsied to check for H.                            pylori.                           - Small hiatal hernia. Recommendation:           - Patient has a contact number available for                            emergencies. The signs and symptoms of potential                            delayed complications were discussed with the  patient. Return to normal activities tomorrow.                            Written discharge instructions were provided to the                            patient.                           - Resume previous diet.                           - Continue present medications.                           - Await pathology results. If negative for H.                            pylori will consider further testing with CT scan                            abd/pelvis. Milus Banister, MD 02/02/2019 9:35:29 AM This report has been signed electronically.

## 2019-02-02 NOTE — Progress Notes (Signed)
A and O x3. Report to RN. Tolerated MAC anesthesia well.Teeth unchanged after procedure.

## 2019-02-02 NOTE — Patient Instructions (Signed)
YOU HAD AN ENDOSCOPIC PROCEDURE TODAY AT [facility]:   Refer to the procedure report that was given to you for any specific questions about what was found during the examination.  If the procedure report does not answer your questions, please call your gastroenterologist to clarify.  If you requested that your care partner not be given the details of your procedure findings, then the procedure report has been included in a sealed envelope for you to review at your convenience later.  YOU SHOULD EXPECT: Some feelings of bloating in the abdomen. Passage of more gas than usual.  Walking can help get rid of the air that was put into your GI tract during the procedure and reduce the bloating. If you had a lower endoscopy (such as a colonoscopy or flexible sigmoidoscopy) you may notice spotting of blood in your stool or on the toilet paper. If you underwent a bowel prep for your procedure, you may not have a normal bowel movement for a few days.  Please Note:  You might notice some irritation and congestion in your nose or some drainage.  This is from the oxygen used during your procedure.  There is no need for concern and it should clear up in a day or so.  SYMPTOMS TO REPORT IMMEDIATELY:    Following upper endoscopy (EGD)  Vomiting of blood or coffee ground material  New chest pain or pain under the shoulder blades  Painful or persistently difficult swallowing  New shortness of breath  Fever of 100F or higher  Black, tarry-looking stools  For urgent or emergent issues, a gastroenterologist can be reached at any hour by calling [phone].   DIET:  We do recommend a small meal at first, but then you may proceed to your regular diet.  Drink plenty of fluids but you should avoid alcoholic beverages for 24 hours.  ACTIVITY:  You should plan to take it easy for the rest of today and you should NOT DRIVE or use heavy machinery until tomorrow (because of the sedation medicines used  during the test).    FOLLOW UP: Our staff will call the number listed on your records 48-72 hours following your procedure to check on you and address any questions or concerns that you may have regarding the information given to you following your procedure. If we do not reach you, we will leave a message.  We will attempt to reach you two times.  During this call, we will ask if you have developed any symptoms of COVID 19. If you develop any symptoms (ie: fever, flu-like symptoms, shortness of breath, cough etc.) before then, please call [phone].  If you test positive for Covid 19 in the 2 weeks post procedure, please call and report this information to [place].    If any biopsies were taken you will be contacted by phone or by letter within the next 1-3 weeks.  Please call us at [phone] if you have not heard about the biopsies in 3 weeks.    SIGNATURES/CONFIDENTIALITY: You and/or your care partner have signed paperwork which will be entered into your electronic medical record.  These signatures attest to the fact that that the information above on your After Visit Summary has been reviewed and is understood.  Full responsibility of the confidentiality of this discharge information lies with you and/or your care-partner.   Resume medications. Information given on gastritis and hiatal hernia.

## 2019-02-04 ENCOUNTER — Telehealth: Payer: Self-pay | Admitting: *Deleted

## 2019-02-04 NOTE — Telephone Encounter (Signed)
  Follow up Call-  Call back number 02/02/2019  Post procedure Call Back phone  # [phone]  Permission to leave phone message Yes  Some recent data might be hidden     Patient questions:  Do you have a fever, pain , or abdominal swelling? No. Pain Score  0 *  Have you tolerated food without any problems? Yes.    Have you been able to return to your normal activities? Yes.    Do you have any questions about your discharge instructions: Diet   No. Medications  No. Follow up visit  No.  Do you have questions or concerns about your Care? No.  Actions: * If pain score is 4 or above: 1. No action needed, pain <4.Have you developed a fever since your procedure? no  2.   Have you had an respiratory symptoms (SOB or cough) since your procedure? no  3.   Have you tested positive for COVID 19 since your procedure no  4.   Have you had any family members/close contacts diagnosed with the COVID 19 since your procedure?  no   If yes to any of these questions please route to Joylene John, RN and Alphonsa Gin, Therapist, sports.

## 2019-02-08 ENCOUNTER — Other Ambulatory Visit: Payer: Self-pay

## 2019-02-08 DIAGNOSIS — R1013 Epigastric pain: Secondary | ICD-10-CM

## 2019-02-09 ENCOUNTER — Encounter: Payer: Medicare Other | Admitting: Gastroenterology

## 2019-02-09 ENCOUNTER — Other Ambulatory Visit (INDEPENDENT_AMBULATORY_CARE_PROVIDER_SITE_OTHER): Payer: Medicare Other

## 2019-02-09 DIAGNOSIS — R1013 Epigastric pain: Secondary | ICD-10-CM

## 2019-02-09 LAB — CREATININE, SERUM: Creatinine, Ser: 0.76 mg/dL (ref 0.40–1.20)

## 2019-02-09 LAB — BUN: BUN: 19 mg/dL (ref 6–23)

## 2019-02-10 ENCOUNTER — Encounter: Payer: Medicare Other | Admitting: Gastroenterology

## 2019-02-11 ENCOUNTER — Other Ambulatory Visit: Payer: Self-pay

## 2019-02-11 ENCOUNTER — Ambulatory Visit (HOSPITAL_COMMUNITY)
Admission: RE | Admit: 2019-02-11 | Discharge: 2019-02-11 | Disposition: A | Discharge status: Home / Self Care | Payer: Medicare Other | Source: Ambulatory Visit | Attending: Gastroenterology | Admitting: Gastroenterology

## 2019-02-11 DIAGNOSIS — R1013 Epigastric pain: Secondary | ICD-10-CM

## 2019-02-11 MED ORDER — IOHEXOL 300 MG/ML  SOLN
100.0000 mL | Freq: Once | INTRAMUSCULAR | Status: AC | PRN
  Administered 2019-02-11: 100 mL via INTRAVENOUS

## 2019-02-11 MED ORDER — SODIUM CHLORIDE (PF) 0.9 % IJ SOLN
INTRAMUSCULAR | Status: AC
  Filled 2019-02-11: qty 50

## 2019-03-30 ENCOUNTER — Other Ambulatory Visit: Payer: Self-pay | Admitting: Internal Medicine

## 2019-03-30 ENCOUNTER — Telehealth: Payer: Self-pay

## 2019-03-30 DIAGNOSIS — M810 Age-related osteoporosis without current pathological fracture: Secondary | ICD-10-CM

## 2019-03-30 DIAGNOSIS — E210 Primary hyperparathyroidism: Secondary | ICD-10-CM

## 2019-03-30 NOTE — Telephone Encounter (Signed)
I ordered it at the breast center.

## 2019-03-30 NOTE — Telephone Encounter (Signed)
Patient calling today to have bone density ordered and scheduled for her-good contact number is [phone]

## 2019-03-30 NOTE — Telephone Encounter (Signed)
Please place order.

## 2019-04-01 DIAGNOSIS — L821 Other seborrheic keratosis: Secondary | ICD-10-CM | POA: Diagnosis not present

## 2019-04-01 DIAGNOSIS — Z23 Encounter for immunization: Secondary | ICD-10-CM | POA: Diagnosis not present

## 2019-04-01 DIAGNOSIS — L988 Other specified disorders of the skin and subcutaneous tissue: Secondary | ICD-10-CM | POA: Diagnosis not present

## 2019-04-01 DIAGNOSIS — Z411 Encounter for cosmetic surgery: Secondary | ICD-10-CM | POA: Diagnosis not present

## 2019-04-08 DIAGNOSIS — Z23 Encounter for immunization: Secondary | ICD-10-CM | POA: Diagnosis not present

## 2019-04-12 ENCOUNTER — Other Ambulatory Visit: Payer: Medicare Other

## 2019-04-14 ENCOUNTER — Ambulatory Visit
Admission: RE | Admit: 2019-04-14 | Discharge: 2019-04-14 | Disposition: A | Discharge status: Home / Self Care | Payer: Medicare Other | Source: Ambulatory Visit | Attending: Internal Medicine | Admitting: Internal Medicine

## 2019-04-14 ENCOUNTER — Other Ambulatory Visit: Payer: Self-pay

## 2019-04-14 DIAGNOSIS — M810 Age-related osteoporosis without current pathological fracture: Secondary | ICD-10-CM

## 2019-04-14 DIAGNOSIS — Z780 Asymptomatic menopausal state: Secondary | ICD-10-CM | POA: Diagnosis not present

## 2019-04-14 DIAGNOSIS — E210 Primary hyperparathyroidism: Secondary | ICD-10-CM

## 2019-04-14 DIAGNOSIS — M8588 Other specified disorders of bone density and structure, other site: Secondary | ICD-10-CM | POA: Diagnosis not present

## 2019-05-04 ENCOUNTER — Telehealth: Payer: Self-pay | Admitting: Cardiology

## 2019-05-04 NOTE — Telephone Encounter (Signed)
Pt is calling in to make an appt this week with Dr. Meda Coffee or an APP for ongoing dizziness, fatigue, lower extremity edema, and having to use her diuretic more frequently. Pt states this has been going on for months now, and has just been managing this herself, due to [place], and staying home to be safe.  Pt states she really needs to have this addressed, for she has let this go on for too long.  Pt states the dizziness and fatigue are intermittent, but have become more frequent.  Pt states her lower extremity edema would usually very rarely occur, but now its occurring more frequently, causing her to turn to her diuretic more frequently.  Pt states she has no chest pain, sob, doe, pre-syncopal or syncopal episodes.  Pt states she cannot wait until June to see Dr. Meda Coffee, for that was what the operator offered her on the phone.  Scheduled the pt to come in and see Kathyrn Drown NP for tomorrow 05/05/19 at 1115.  Advised the pt to arrive 15 mins prior to this appt and wear her mask.  Informed the pt that I will route this message to Dr. Meda Coffee as a general FYI, to make her aware of this plan. Advised the pt to make sure she is staying hydrated with water, and avoid salt.  Advised her to elevate her lower extremities while at rest.  Advised her to consider purchasing some scales, so that she can weigh herself on days she feels like she is accumulating more fluid in her lower extremities. Advised the pt to change positions slowly, to help with her dizziness.   Pt verbalized understanding and agrees with this plan.

## 2019-05-04 NOTE — Telephone Encounter (Signed)
STAT if patient feels like he/she is going to faint   1) Are you dizzy now? No  2) Do you feel faint or have you passed out? No  3) Do you have any other symptoms? Fatigue  4) Have you checked your HR and BP (record if available)?  No 

## 2019-05-05 ENCOUNTER — Other Ambulatory Visit: Payer: Self-pay

## 2019-05-05 ENCOUNTER — Encounter: Payer: Self-pay | Admitting: Cardiology

## 2019-05-05 ENCOUNTER — Ambulatory Visit (INDEPENDENT_AMBULATORY_CARE_PROVIDER_SITE_OTHER): Payer: Medicare Other | Admitting: Cardiology

## 2019-05-05 VITALS — BP 108/64 | HR 65 | Ht 63.0 in | Wt 126.8 lb

## 2019-05-05 DIAGNOSIS — M899 Disorder of bone, unspecified: Secondary | ICD-10-CM

## 2019-05-05 DIAGNOSIS — R5383 Other fatigue: Secondary | ICD-10-CM

## 2019-05-05 DIAGNOSIS — R42 Dizziness and giddiness: Secondary | ICD-10-CM

## 2019-05-05 DIAGNOSIS — R6889 Other general symptoms and signs: Secondary | ICD-10-CM | POA: Diagnosis not present

## 2019-05-05 DIAGNOSIS — R600 Localized edema: Secondary | ICD-10-CM

## 2019-05-05 MED ORDER — FUROSEMIDE 20 MG PO TABS: 20.0000 mg | ORAL_TABLET | Freq: Every day | ORAL | 1 refills | Status: DC
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
## 2019-05-05 NOTE — Patient Instructions (Signed)
Medication Instructions:   Your physician has recommended you make the following change in your medication:   1) Increase your Lasix to 20 mg, 1 tablet by mouth once a day  *If you need a refill on your cardiac medications before your next appointment, please call your pharmacy*  Lab Work:  You will have labs drawn today: CBC/BMET/Vitamin D/Vitamin B12   If you have labs (blood work) drawn today and your tests are completely normal, you will receive your results only by: Marland Kitchen MyChart Message (if you have MyChart) OR . A paper copy in the mail If you have any lab test that is abnormal or we need to change your treatment, we will call you to review the results.  Testing/Procedures:  Your physician has requested that you have an echocardiogram. Echocardiography is a painless test that uses sound waves to create images of your heart. It provides your doctor with information about the size and shape of your heart and how well your heart's chambers and valves are working. This procedure takes approximately one hour. There are no restrictions for this procedure.  Follow-Up: At [facility], you and your health needs are our priority.  As part of our continuing mission to provide you with exceptional heart care, we have created designated Provider Care Teams.  These Care Teams include your primary Cardiologist (physician) and Advanced Practice Providers (APPs -  Physician Assistants and Nurse Practitioners) who all work together to provide you with the care you need, when you need it.  We recommend signing up for the patient portal called "MyChart".  Sign up information is provided on this After Visit Summary.  MyChart is used to connect with patients for Virtual Visits (Telemedicine).  Patients are able to view lab/test results, encounter notes, upcoming appointments, etc.  Non-urgent messages can be sent to your provider as well.   To learn more about what you can do with MyChart, go to  [email or web address].    Your next appointment:    On 08/06/19 at 8:20AM with Ena Dawley, MD

## 2019-05-06 DIAGNOSIS — Z23 Encounter for immunization: Secondary | ICD-10-CM | POA: Diagnosis not present

## 2019-05-06 LAB — BASIC METABOLIC PANEL
BUN/Creatinine Ratio: 22 (ref 12–28)
BUN: 17 mg/dL (ref 8–27)
CO2: 26 mmol/L (ref 20–29)
Calcium: 9.1 mg/dL (ref 8.7–10.3)
Chloride: 103 mmol/L (ref 96–106)
Creatinine, Ser: 0.77 mg/dL (ref 0.57–1.00)
GFR calc Af Amer: 92 mL/min/{1.73_m2} (ref >59)
GFR calc non Af Amer: 80 mL/min/{1.73_m2} (ref >59)
Glucose: 86 mg/dL (ref 65–99)
Potassium: 4.1 mmol/L (ref 3.5–5.2)
Sodium: 142 mmol/L (ref 134–144)

## 2019-05-06 LAB — VITAMIN D 25 HYDROXY (VIT D DEFICIENCY, FRACTURES): Vit D, 25-Hydroxy: 48.1 ng/mL (ref 30.0–100.0)

## 2019-05-06 LAB — CBC
Hematocrit: 37.3 % (ref 34.0–46.6)
Hemoglobin: 12.6 g/dL (ref 11.1–15.9)
MCH: 29.5 pg (ref 26.6–33.0)
MCHC: 33.8 g/dL (ref 31.5–35.7)
MCV: 87 fL (ref 79–97)
Platelets: 208 10*3/uL (ref 150–450)
RBC: 4.27 x10E6/uL (ref 3.77–5.28)
RDW: 12.3 % (ref 11.7–15.4)
WBC: 4.9 10*3/uL (ref 3.4–10.8)

## 2019-05-06 LAB — VITAMIN B12: Vitamin B-12: 352 pg/mL (ref 232–1245)

## 2019-05-18 ENCOUNTER — Other Ambulatory Visit: Payer: Self-pay

## 2019-05-18 ENCOUNTER — Encounter: Payer: Self-pay | Admitting: Internal Medicine

## 2019-05-18 ENCOUNTER — Ambulatory Visit (INDEPENDENT_AMBULATORY_CARE_PROVIDER_SITE_OTHER): Payer: Medicare Other | Admitting: Internal Medicine

## 2019-05-18 VITALS — BP 142/80 | HR 64 | Ht 63.0 in | Wt 125.0 lb

## 2019-05-18 DIAGNOSIS — Z8639 Personal history of other endocrine, nutritional and metabolic disease: Secondary | ICD-10-CM | POA: Diagnosis not present

## 2019-05-18 DIAGNOSIS — E210 Primary hyperparathyroidism: Secondary | ICD-10-CM

## 2019-05-18 DIAGNOSIS — M810 Age-related osteoporosis without current pathological fracture: Secondary | ICD-10-CM
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
## 2019-05-18 NOTE — Patient Instructions (Addendum)
Continue Vitamin D 2000 units daily.  Please come back for a follow-up appointment in 1 year.

## 2019-05-19 DIAGNOSIS — Z8041 Family history of malignant neoplasm of ovary: Secondary | ICD-10-CM | POA: Diagnosis not present

## 2019-05-19 DIAGNOSIS — Z124 Encounter for screening for malignant neoplasm of cervix: Secondary | ICD-10-CM | POA: Diagnosis not present

## 2019-05-19 DIAGNOSIS — Z800 Family history of malignant neoplasm of digestive organs: Secondary | ICD-10-CM | POA: Diagnosis not present

## 2019-05-19 DIAGNOSIS — N8111 Cystocele, midline: Secondary | ICD-10-CM | POA: Diagnosis not present

## 2019-05-19 DIAGNOSIS — Z01419 Encounter for gynecological examination (general) (routine) without abnormal findings: Secondary | ICD-10-CM | POA: Diagnosis not present

## 2019-05-21 ENCOUNTER — Other Ambulatory Visit (HOSPITAL_COMMUNITY): Payer: Medicare Other

## 2019-05-27 ENCOUNTER — Ambulatory Visit (HOSPITAL_COMMUNITY): Payer: Medicare Other | Attending: Internal Medicine

## 2019-05-27 ENCOUNTER — Other Ambulatory Visit: Payer: Self-pay

## 2019-05-27 DIAGNOSIS — R600 Localized edema: Secondary | ICD-10-CM | POA: Diagnosis not present

## 2019-05-27 MED ORDER — PERFLUTREN LIPID MICROSPHERE
1.0000 mL | INTRAVENOUS | Status: AC | PRN
  Administered 2019-05-27: 2 mL via INTRAVENOUS

## 2019-06-09 ENCOUNTER — Ambulatory Visit: Payer: Medicare Other | Admitting: Physician Assistant

## 2019-06-18 ENCOUNTER — Telehealth: Payer: Self-pay | Admitting: Internal Medicine

## 2019-06-18 NOTE — Telephone Encounter (Signed)
Patient called asking when could schedule her Prolia. Looks like she's due in May based on her last injection date.

## 2019-06-18 NOTE — Telephone Encounter (Signed)
Pt has been submitted to the portal, awaiting SOB She is not due until after 07/06/2019

## 2019-06-25 ENCOUNTER — Other Ambulatory Visit: Payer: Medicare Other

## 2019-06-30 NOTE — Telephone Encounter (Signed)
According to SOB, patient does not owe anything for this injection. Called pt and left voicemail requesting a callback in order to schedule this. She cannot be seen until after 07/06/19.

## 2019-06-30 NOTE — Telephone Encounter (Signed)
Pt returned phone call and scheduled Prolia injection for 07/08/19. [facility] office staff did inform pt that she does not owe anything according to her summary of benefits. Pt verbalized understanding and acceptance of this.

## 2019-07-08 ENCOUNTER — Ambulatory Visit (INDEPENDENT_AMBULATORY_CARE_PROVIDER_SITE_OTHER): Payer: Medicare Other

## 2019-07-08 ENCOUNTER — Other Ambulatory Visit: Payer: Self-pay

## 2019-07-08 DIAGNOSIS — M810 Age-related osteoporosis without current pathological fracture: Secondary | ICD-10-CM

## 2019-07-08 MED ORDER — DENOSUMAB 60 MG/ML ~~LOC~~ SOSY
60.0000 mg | PREFILLED_SYRINGE | Freq: Once | SUBCUTANEOUS | Status: AC
  Administered 2019-07-08: 60 mg via SUBCUTANEOUS

## 2019-07-08 NOTE — Progress Notes (Signed)
Per orders of Dr. Gherghe injection of Prolia given today by Manuel Lawhead, Certified Medical Assistant . Patient tolerated injection well.  

## 2019-08-06 ENCOUNTER — Ambulatory Visit: Payer: Medicare Other | Admitting: Cardiology

## 2019-08-13 ENCOUNTER — Encounter: Payer: Self-pay | Admitting: Cardiology

## 2019-08-13 ENCOUNTER — Ambulatory Visit (INDEPENDENT_AMBULATORY_CARE_PROVIDER_SITE_OTHER): Payer: Medicare Other | Admitting: Cardiology

## 2019-08-13 ENCOUNTER — Other Ambulatory Visit: Payer: Self-pay

## 2019-08-13 VITALS — BP 120/76 | HR 67 | Ht 62.5 in | Wt 124.0 lb

## 2019-08-13 DIAGNOSIS — R42 Dizziness and giddiness: Secondary | ICD-10-CM | POA: Diagnosis not present

## 2019-08-13 DIAGNOSIS — R5383 Other fatigue: Secondary | ICD-10-CM | POA: Diagnosis not present

## 2019-08-13 DIAGNOSIS — R600 Localized edema: Secondary | ICD-10-CM | POA: Diagnosis not present

## 2019-08-13 DIAGNOSIS — I201 Angina pectoris with documented spasm: Secondary | ICD-10-CM | POA: Diagnosis not present

## 2019-08-13 NOTE — Patient Instructions (Signed)
Medication Instructions:  ° °Your physician recommends that you continue on your current medications as directed. Please refer to the Current Medication list given to you today. ° °*If you need a refill on your cardiac medications before your next appointment, please call your pharmacy* ° °Follow-Up: °At [facility], you and your health needs are our priority.  As part of our continuing mission to provide you with exceptional heart care, we have created designated Provider Care Teams.  These Care Teams include your primary Cardiologist (physician) and Advanced Practice Providers (APPs -  Physician Assistants and Nurse Practitioners) who all work together to provide you with the care you need, when you need it. ° °We recommend signing up for the patient portal called "MyChart".  Sign up information is provided on this After Visit Summary.  MyChart is used to connect with patients for Virtual Visits (Telemedicine).  Patients are able to view lab/test results, encounter notes, upcoming appointments, etc.  Non-urgent messages can be sent to your provider as well.   °To learn more about what you can do with MyChart, go to [email or web address].   ° °Your next appointment:   °12 month(s) ° °The format for your next appointment:   °In Person ° °Provider:   °Katarina Nelson, MD ° ° ° ° °
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
## 2019-10-27 DIAGNOSIS — N952 Postmenopausal atrophic vaginitis: Secondary | ICD-10-CM | POA: Diagnosis not present

## 2019-10-27 DIAGNOSIS — N950 Postmenopausal bleeding: Secondary | ICD-10-CM | POA: Diagnosis not present

## 2019-12-20 ENCOUNTER — Ambulatory Visit (INDEPENDENT_AMBULATORY_CARE_PROVIDER_SITE_OTHER): Payer: Medicare Other | Admitting: *Deleted

## 2019-12-20 ENCOUNTER — Other Ambulatory Visit: Payer: Self-pay

## 2019-12-20 DIAGNOSIS — Z23 Encounter for immunization: Secondary | ICD-10-CM | POA: Diagnosis not present

## 2020-01-24 ENCOUNTER — Ambulatory Visit: Payer: Medicare Other | Admitting: Family

## 2020-01-24 ENCOUNTER — Encounter: Payer: Self-pay | Admitting: Family

## 2020-01-24 ENCOUNTER — Ambulatory Visit (INDEPENDENT_AMBULATORY_CARE_PROVIDER_SITE_OTHER): Payer: Medicare Other | Admitting: Family

## 2020-01-24 ENCOUNTER — Other Ambulatory Visit: Payer: Self-pay

## 2020-01-24 VITALS — BP 118/74 | HR 80 | Temp 97.9°F | Ht 62.5 in | Wt 127.0 lb

## 2020-01-24 DIAGNOSIS — I201 Angina pectoris with documented spasm: Secondary | ICD-10-CM

## 2020-01-24 DIAGNOSIS — R600 Localized edema: Secondary | ICD-10-CM | POA: Diagnosis not present

## 2020-01-24 DIAGNOSIS — M810 Age-related osteoporosis without current pathological fracture: Secondary | ICD-10-CM | POA: Diagnosis not present

## 2020-01-24 DIAGNOSIS — E538 Deficiency of other specified B group vitamins: Secondary | ICD-10-CM | POA: Diagnosis not present

## 2020-01-24 DIAGNOSIS — Z13220 Encounter for screening for lipoid disorders: Secondary | ICD-10-CM

## 2020-01-24 DIAGNOSIS — G4762 Sleep related leg cramps: Secondary | ICD-10-CM

## 2020-01-24 LAB — COMPREHENSIVE METABOLIC PANEL
ALT: 14 U/L (ref 0–35)
AST: 18 U/L (ref 0–37)
Albumin: 4.1 g/dL (ref 3.5–5.2)
Alkaline Phosphatase: 56 U/L (ref 39–117)
BUN: 19 mg/dL (ref 6–23)
CO2: 32 mEq/L (ref 19–32)
Calcium: 9.1 mg/dL (ref 8.4–10.5)
Chloride: 104 mEq/L (ref 96–112)
Creatinine, Ser: 0.69 mg/dL (ref 0.40–1.20)
GFR: 88.52 mL/min (ref >60.00)
Glucose, Bld: 97 mg/dL (ref 70–99)
Potassium: 3.8 mEq/L (ref 3.5–5.1)
Sodium: 142 mEq/L (ref 135–145)
Total Bilirubin: 0.3 mg/dL (ref 0.2–1.2)
Total Protein: 6.7 g/dL (ref 6.0–8.3)

## 2020-01-24 LAB — LIPID PANEL
Cholesterol: 167 mg/dL (ref 0–200)
HDL: 58.8 mg/dL (ref >39.00)
LDL Cholesterol: 86 mg/dL (ref 0–99)
NonHDL: 108.44
Total CHOL/HDL Ratio: 3
Triglycerides: 113 mg/dL (ref 0.0–149.0)
VLDL: 22.6 mg/dL (ref 0.0–40.0)

## 2020-01-24 LAB — MAGNESIUM: Magnesium: 2.1 mg/dL (ref 1.5–2.5)

## 2020-01-24 LAB — VITAMIN B12: Vitamin B-12: 195 pg/mL — ABNORMAL LOW (ref 211–911)

## 2020-01-24 NOTE — Patient Instructions (Signed)
COVID vaccine clinic through [place]
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
## 2020-01-25 ENCOUNTER — Telehealth: Payer: Self-pay | Admitting: Internal Medicine

## 2020-01-25 ENCOUNTER — Encounter: Payer: Self-pay | Admitting: Family

## 2020-01-25 NOTE — Telephone Encounter (Signed)
Patient called re: Patient thinks it is time for her to have her Prolia injection. Patient states her last Prolia injection was in May 2021. Please advise Patient at ph# [phone].

## 2020-01-25 NOTE — Telephone Encounter (Signed)
Please review

## 2020-01-26 DIAGNOSIS — I25119 Atherosclerotic heart disease of native coronary artery with unspecified angina pectoris: Secondary | ICD-10-CM

## 2020-01-26 DIAGNOSIS — Z79899 Other long term (current) drug therapy: Secondary | ICD-10-CM

## 2020-01-26 DIAGNOSIS — E785 Hyperlipidemia, unspecified: Secondary | ICD-10-CM

## 2020-01-27 ENCOUNTER — Other Ambulatory Visit: Payer: Self-pay

## 2020-01-27 ENCOUNTER — Ambulatory Visit (INDEPENDENT_AMBULATORY_CARE_PROVIDER_SITE_OTHER): Payer: Medicare Other

## 2020-01-27 ENCOUNTER — Ambulatory Visit: Payer: Medicare Other

## 2020-01-27 DIAGNOSIS — E538 Deficiency of other specified B group vitamins: Secondary | ICD-10-CM | POA: Diagnosis not present

## 2020-01-27 MED ORDER — ROSUVASTATIN CALCIUM 10 MG PO TABS: 10.0000 mg | ORAL_TABLET | Freq: Every day | ORAL | 1 refills | Status: DC

## 2020-01-27 MED ORDER — CYANOCOBALAMIN 1000 MCG/ML IJ SOLN
1000.0000 ug | INTRAMUSCULAR | Status: AC
  Administered 2020-01-27 – 2020-02-16 (×4): 1000 ug via INTRAMUSCULAR

## 2020-01-27 NOTE — [place] [facility] (Signed)
Pt here for weekly B12 injection #1 of 4 per L Murray FNP  B12 102mcg given IM right deltoid and pt tolerated injection well.  Pt to schedule next appt upon check out.

## 2020-01-27 NOTE — Telephone Encounter (Signed)
Bethany Spark, MD  You; Thompson Grayer, RN 12 hours ago (7:17 PM)   Yes, I would recommend to start taking 10 mg of Rosuvastatin QHS. Repeat NMR lipids and LFTs in 4 weeks.     Endorsed to the pt recommendations as mentioned above per Dr. Meda Coffee.  Endorsed to the pt via mychart message that per Dr. Meda Coffee, she would like for her to start taking rosuvastatin 10 mg po daily at bedtime, and come into the office in 4 weeks to have repeat labs-NMR lipoprofile, lipoprotein A, and apolipoprotein B,  and LFTs done.  Scheduled the pt to come into the office for repeat labs in around 4 weeks for 02/28/20, and advised her via mychart message to come fasting to this lab appt.  Endorsed to the pt that rosuvastatin 10 mg po daily at bedtime was sent to her pharmacy on file.  Advised the pt to mychart me back if lab appt date is of conflict, or if she has any further questions regarding new med start and recommendations, per Dr. Meda Coffee.

## 2020-01-27 NOTE — Telephone Encounter (Signed)
Last read by [name] at 10:23 AM on 01/27/2020.

## 2020-01-28 ENCOUNTER — Ambulatory Visit (INDEPENDENT_AMBULATORY_CARE_PROVIDER_SITE_OTHER): Payer: Medicare Other

## 2020-01-28 DIAGNOSIS — M810 Age-related osteoporosis without current pathological fracture: Secondary | ICD-10-CM

## 2020-01-28 MED ORDER — DENOSUMAB 60 MG/ML ~~LOC~~ SOSY
60.0000 mg | PREFILLED_SYRINGE | Freq: Once | SUBCUTANEOUS | Status: AC
  Administered 2020-01-28: 60 mg via SUBCUTANEOUS

## 2020-01-28 NOTE — Telephone Encounter (Signed)
Please advise call [phone] ext [phone] work.  Call pt around 2pm. Pt says she is late getting Prolia & needs it scheduled.

## 2020-01-28 NOTE — [place] [facility] (Signed)
Prolia injection administered. Patient tolerated well.  

## 2020-01-28 NOTE — Telephone Encounter (Signed)
Put pt on the nurse schedule for today-she is aware

## 2020-02-01 DIAGNOSIS — Z23 Encounter for immunization: Secondary | ICD-10-CM | POA: Diagnosis not present

## 2020-02-03 ENCOUNTER — Ambulatory Visit (INDEPENDENT_AMBULATORY_CARE_PROVIDER_SITE_OTHER): Payer: Medicare Other

## 2020-02-03 ENCOUNTER — Other Ambulatory Visit: Payer: Self-pay

## 2020-02-03 DIAGNOSIS — E538 Deficiency of other specified B group vitamins: Secondary | ICD-10-CM

## 2020-02-03 NOTE — [place] [facility] (Signed)
Pt here for weekly B12 injection #2 of 4 per Jodi Mourning FNP  B12 1023mcg given IM right deltoid and pt tolerated injection well.  Next B12 injection scheduled for 02/10/20.  Dr Sharlet Salina: Please cosign since Mickel Baas is out of office today.

## 2020-02-10 ENCOUNTER — Ambulatory Visit (INDEPENDENT_AMBULATORY_CARE_PROVIDER_SITE_OTHER): Payer: Medicare Other

## 2020-02-10 ENCOUNTER — Other Ambulatory Visit: Payer: Self-pay

## 2020-02-10 DIAGNOSIS — E538 Deficiency of other specified B group vitamins: Secondary | ICD-10-CM | POA: Diagnosis not present

## 2020-02-10 NOTE — [facility] (Signed)
Pt here for weekly B12 injection #3 of 4 per Jodi Mourning, FNP  B12 1028mcg given IM left deltoid and pt tolerated injection well.  Next B12 injection scheduled for 02/16/20.  Can you please cosign since PCP is out of office?

## 2020-02-16 ENCOUNTER — Other Ambulatory Visit: Payer: Self-pay

## 2020-02-16 ENCOUNTER — Ambulatory Visit: Payer: Medicare Other

## 2020-02-16 ENCOUNTER — Encounter: Payer: Self-pay | Admitting: Family

## 2020-02-16 ENCOUNTER — Ambulatory Visit (INDEPENDENT_AMBULATORY_CARE_PROVIDER_SITE_OTHER): Payer: Medicare Other | Admitting: Family

## 2020-02-16 VITALS — BP 124/70 | HR 62 | Temp 98.0°F | Ht 62.0 in | Wt 127.0 lb

## 2020-02-16 DIAGNOSIS — H1031 Unspecified acute conjunctivitis, right eye: Secondary | ICD-10-CM | POA: Diagnosis not present

## 2020-02-16 DIAGNOSIS — E538 Deficiency of other specified B group vitamins: Secondary | ICD-10-CM | POA: Diagnosis not present

## 2020-02-16 MED ORDER — TOBRAMYCIN 0.3 % OP SOLN: 1.0000 [drp] | Freq: Four times a day (QID) | OPHTHALMIC | 0 refills | Status: DC

## 2020-02-16 NOTE — Patient Instructions (Signed)
Please plan to get your B12 level re-checked in the next 2-3 weeks as we discussed; order is in place at [facility]; the lab is open 7:30-5:30 and you don't need appointment for lab.

## 2020-02-16 NOTE — [place] [facility] (Signed)
B12 given and tolerated well °
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
## 2020-02-28 ENCOUNTER — Other Ambulatory Visit: Payer: Medicare Other

## 2020-03-02 ENCOUNTER — Other Ambulatory Visit: Payer: Self-pay

## 2020-03-02 ENCOUNTER — Other Ambulatory Visit: Payer: Medicare Other

## 2020-03-02 DIAGNOSIS — Z79899 Other long term (current) drug therapy: Secondary | ICD-10-CM

## 2020-03-02 DIAGNOSIS — E785 Hyperlipidemia, unspecified: Secondary | ICD-10-CM

## 2020-03-02 DIAGNOSIS — I25119 Atherosclerotic heart disease of native coronary artery with unspecified angina pectoris: Secondary | ICD-10-CM

## 2020-03-03 LAB — APOLIPOPROTEIN B: Apolipoprotein B: 40 mg/dL

## 2020-03-03 LAB — NMR, LIPOPROFILE
Cholesterol, Total: 127 mg/dL (ref 100–199)
HDL Particle Number: 39.1 umol/L
HDL-C: 79 mg/dL
LDL Particle Number: <300 nmol/L
LDL-C (NIH Calc): 35 mg/dL (ref 0–99)
LP-IR Score: <25
Small LDL Particle Number: <90 nmol/L
Triglycerides: 62 mg/dL (ref 0–149)

## 2020-03-03 LAB — LIPOPROTEIN A (LPA): Lipoprotein (a): 22.2 nmol/L

## 2020-03-03 LAB — HEPATIC FUNCTION PANEL
ALT: 19 IU/L (ref 0–32)
AST: 22 IU/L (ref 0–40)
Albumin: 4.3 g/dL (ref 3.8–4.8)
Alkaline Phosphatase: 71 IU/L (ref 44–121)
Bilirubin Total: 0.5 mg/dL (ref 0.0–1.2)
Bilirubin, Direct: 0.15 mg/dL (ref 0.00–0.40)
Total Protein: 6.5 g/dL (ref 6.0–8.5)

## 2020-03-08 ENCOUNTER — Encounter: Payer: Self-pay | Admitting: Family

## 2020-03-08 ENCOUNTER — Other Ambulatory Visit (INDEPENDENT_AMBULATORY_CARE_PROVIDER_SITE_OTHER): Payer: Medicare Other

## 2020-03-08 DIAGNOSIS — E538 Deficiency of other specified B group vitamins: Secondary | ICD-10-CM | POA: Diagnosis not present

## 2020-03-08 LAB — VITAMIN B12: Vitamin B-12: 443 pg/mL (ref 211–911)

## 2020-03-16 ENCOUNTER — Ambulatory Visit: Payer: Medicare Other

## 2020-03-22 ENCOUNTER — Other Ambulatory Visit: Payer: Self-pay

## 2020-03-23 ENCOUNTER — Ambulatory Visit (INDEPENDENT_AMBULATORY_CARE_PROVIDER_SITE_OTHER): Payer: Medicare Other

## 2020-03-23 DIAGNOSIS — E538 Deficiency of other specified B group vitamins: Secondary | ICD-10-CM

## 2020-03-23 MED ORDER — CYANOCOBALAMIN 1000 MCG/ML IJ SOLN
1000.0000 ug | INTRAMUSCULAR | Status: AC
  Administered 2020-03-23 – 2020-10-02 (×7): 1000 ug via INTRAMUSCULAR

## 2020-03-23 NOTE — [place] [facility] (Signed)
Pt here for monthly B12 injection per Jodi Mourning FNP.  B12 1059mcg given IM right deltoid and pt tolerated injection well.  Pt to schedule next B12 injection upon check out.  Dr Alain Marion: can you please cosign since PCP is out of office today?

## 2020-03-29 ENCOUNTER — Other Ambulatory Visit: Payer: Self-pay | Admitting: Family

## 2020-03-29 ENCOUNTER — Other Ambulatory Visit: Payer: Self-pay | Admitting: Obstetrics and Gynecology

## 2020-03-29 DIAGNOSIS — Z1231 Encounter for screening mammogram for malignant neoplasm of breast: Secondary | ICD-10-CM

## 2020-04-24 ENCOUNTER — Other Ambulatory Visit: Payer: Self-pay

## 2020-04-24 ENCOUNTER — Ambulatory Visit (INDEPENDENT_AMBULATORY_CARE_PROVIDER_SITE_OTHER): Payer: Medicare Other

## 2020-04-24 DIAGNOSIS — E538 Deficiency of other specified B group vitamins: Secondary | ICD-10-CM | POA: Diagnosis not present

## 2020-04-24 MED ORDER — CYANOCOBALAMIN 1000 MCG/ML IJ SOLN
1000.0000 ug | Freq: Once | INTRAMUSCULAR | Status: AC
Start: 2020-04-24 — End: 2021-07-30
  Administered 2021-07-30: 1000 ug via INTRAMUSCULAR

## 2020-04-24 NOTE — [place] [facility] (Signed)
B12 given please cosign

## 2020-04-26 DIAGNOSIS — M720 Palmar fascial fibromatosis [Dupuytren]: Secondary | ICD-10-CM | POA: Diagnosis not present

## 2020-04-26 DIAGNOSIS — M180 Bilateral primary osteoarthritis of first carpometacarpal joints: Secondary | ICD-10-CM | POA: Diagnosis not present

## 2020-05-15 ENCOUNTER — Inpatient Hospital Stay: Admission: RE | Admit: 2020-05-15 | Payer: Medicare Other | Source: Ambulatory Visit

## 2020-05-17 ENCOUNTER — Ambulatory Visit (INDEPENDENT_AMBULATORY_CARE_PROVIDER_SITE_OTHER): Payer: Medicare Other | Admitting: Internal Medicine

## 2020-05-17 ENCOUNTER — Other Ambulatory Visit: Payer: Self-pay

## 2020-05-17 ENCOUNTER — Encounter: Payer: Self-pay | Admitting: Internal Medicine

## 2020-05-17 VITALS — BP 128/82 | HR 66 | Ht 62.0 in | Wt 124.4 lb

## 2020-05-17 DIAGNOSIS — I25119 Atherosclerotic heart disease of native coronary artery with unspecified angina pectoris: Secondary | ICD-10-CM

## 2020-05-17 DIAGNOSIS — M810 Age-related osteoporosis without current pathological fracture: Secondary | ICD-10-CM | POA: Diagnosis not present

## 2020-05-17 DIAGNOSIS — E559 Vitamin D deficiency, unspecified: Secondary | ICD-10-CM | POA: Diagnosis not present

## 2020-05-17 DIAGNOSIS — E210 Primary hyperparathyroidism: Secondary | ICD-10-CM | POA: Diagnosis not present
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
## 2020-05-17 NOTE — Patient Instructions (Signed)
Please stop at the lab.  Continue: - Prolia - vitamin D 2000 units daily.  Start weight bearing exercises:  Exercise for Strong Bones (from [place]) There are two types of exercises that are important for building and maintaining bone density:  weight-bearing and muscle-strengthening exercises. Weight-bearing Exercises These exercises include activities that make you move against gravity while staying upright. Weight-bearing exercises can be high-impact or low-impact. High-impact weight-bearing exercises help build bones and keep them strong. If you have broken a bone due to osteoporosis or are at risk of breaking a bone, you may need to avoid high-impact exercises. If you're not sure, you should check with your healthcare provider. Examples of high-impact weight-bearing exercises are: . Dancing . Doing high-impact aerobics . Hiking . Jogging/running . Jumping Rope . Stair climbing . Tennis Low-impact weight-bearing exercises can also help keep bones strong and are a safe alternative if you cannot do high-impact exercises. Examples of low-impact weight-bearing exercises are: . Using elliptical training machines . Doing low-impact aerobics . Using stair-step machines . Fast walking on a treadmill or outside Muscle-Strengthening Exercises These exercises include activities where you move your body, a weight or some other resistance against gravity. They are also known as resistance exercises and include: . Lifting weights . Using elastic exercise bands . Using weight machines . Lifting your own body weight . Functional movements, such as standing and rising up on your toes Yoga and Pilates can also improve strength, balance and flexibility. However, certain positions may not be safe for people with osteoporosis or those at increased risk of broken bones. For example, exercises that have you bend forward may increase the chance of breaking a bone in the spine. A  physical therapist should be able to help you learn which exercises are safe and appropriate for you. Non-Impact Exercises Non-impact exercises can help you to improve balance, posture and how well you move in everyday activities. These exercises can also help to increase muscle strength and decrease the risk of falls and broken bones. Some of these exercises include: . Balance exercises that strengthen your legs and test your balance, such as Tai Chi, can decrease your risk of falls. . Posture exercises that improve your posture and reduce rounded or "sloping" shoulders can help you decrease the chance of breaking a bone, especially in the spine. . Functional exercises that improve how well you move can help you with everyday activities and decrease your chance of falling and breaking a bone. For example, if you have trouble getting up from a chair or climbing stairs, you should do these activities as exercises. A physical therapist can teach you balance, posture and functional exercises. Starting a New Exercise Program If you haven't exercised regularly for a while, check with your healthcare provider before beginning a new exercise program--particularly if you have health problems such as heart disease, diabetes or high blood pressure. If you're at high risk of breaking a bone, you should work with a physical therapist to develop a safe exercise program. Once you have your healthcare provider's approval, start slowly. If you've already broken bones in the spine because of osteoporosis, be very careful to avoid activities that require reaching down, bending forward, rapid twisting motions, heavy lifting and those that increase your chance of a fall. As you get started, your muscles may feel sore for a day or two after you exercise. If soreness lasts longer, you may be working too hard and need to ease up. Exercises should be  done in a pain-free range of motion. How Much Exercise Do You  Need? Weight-bearing exercises 30 minutes on most days of the week. Do a 30-minutesession or multiple sessions spread out throughout the day. The benefits to your bones are the same.   Muscle-strengthening exercises Two to three days per week. If you don't have much time for strengthening/resistance training, do small amounts at a time. You can do just one body part each day. For example do arms one day, legs the next and trunk the next. You can also spread these exercises out during your normal day.  Balance, posture and functional exercises Every day or as often as needed. You may want to focus on one area more than the others. If you have fallen or lose your balance, spend time doing balance exercises. If you are getting rounded shoulders, work more on posture exercises. If you have trouble climbing stairs or getting up from the couch, do more functional exercises. You can also perform these exercises at one time or spread them during your day. Work with a phyiscal therapist to learn the right exercises for you.    Please come back for a follow-up appointment in 1 year.

## 2020-05-18 ENCOUNTER — Encounter: Payer: Self-pay | Admitting: Internal Medicine

## 2020-05-18 LAB — VITAMIN D 25 HYDROXY (VIT D DEFICIENCY, FRACTURES): Vit D, 25-Hydroxy: 50 ng/mL (ref 30.0–100.0)

## 2020-05-24 ENCOUNTER — Other Ambulatory Visit: Payer: Self-pay

## 2020-05-24 ENCOUNTER — Ambulatory Visit (INDEPENDENT_AMBULATORY_CARE_PROVIDER_SITE_OTHER): Payer: Medicare Other

## 2020-05-24 DIAGNOSIS — E538 Deficiency of other specified B group vitamins: Secondary | ICD-10-CM | POA: Diagnosis not present

## 2020-05-24 NOTE — Progress Notes (Signed)
Pt here for monthly B12 injection per Jodi Mourning, FNP  B12 1047mcg given IM left deltoid and pt tolerated injection well.  Next B12 injection scheduled for 06/22/20.

## 2020-05-25 ENCOUNTER — Ambulatory Visit: Payer: Medicare Other

## 2020-05-31 DIAGNOSIS — R197 Diarrhea, unspecified: Secondary | ICD-10-CM | POA: Diagnosis not present

## 2020-06-01 DIAGNOSIS — R197 Diarrhea, unspecified: Secondary | ICD-10-CM | POA: Diagnosis not present

## 2020-06-01 DIAGNOSIS — Z809 Family history of malignant neoplasm, unspecified: Secondary | ICD-10-CM | POA: Diagnosis not present

## 2020-06-16 ENCOUNTER — Ambulatory Visit: Payer: Medicare Other | Admitting: Gastroenterology

## 2020-06-22 ENCOUNTER — Other Ambulatory Visit: Payer: Self-pay

## 2020-06-22 ENCOUNTER — Ambulatory Visit: Payer: Medicare Other

## 2020-06-22 ENCOUNTER — Ambulatory Visit (INDEPENDENT_AMBULATORY_CARE_PROVIDER_SITE_OTHER): Payer: Medicare Other

## 2020-06-22 DIAGNOSIS — E538 Deficiency of other specified B group vitamins: Secondary | ICD-10-CM | POA: Diagnosis not present

## 2020-06-22 NOTE — Progress Notes (Signed)
Pt here for monthly B12 injection per  Jodi Mourning  B12 1043mcg left deltoid given IM, and pt tolerated injection well.

## 2020-07-03 ENCOUNTER — Ambulatory Visit
Admission: RE | Admit: 2020-07-03 | Discharge: 2020-07-03 | Disposition: A | Discharge status: Home / Self Care | Payer: Medicare Other | Source: Ambulatory Visit | Attending: Family | Admitting: Family

## 2020-07-03 ENCOUNTER — Other Ambulatory Visit: Payer: Self-pay

## 2020-07-03 DIAGNOSIS — Z1231 Encounter for screening mammogram for malignant neoplasm of breast: Secondary | ICD-10-CM | POA: Diagnosis not present

## 2020-07-04 ENCOUNTER — Other Ambulatory Visit: Payer: Self-pay

## 2020-07-05 ENCOUNTER — Ambulatory Visit (INDEPENDENT_AMBULATORY_CARE_PROVIDER_SITE_OTHER): Payer: Medicare Other | Admitting: Internal Medicine

## 2020-07-05 ENCOUNTER — Other Ambulatory Visit: Payer: Self-pay

## 2020-07-05 ENCOUNTER — Ambulatory Visit: Payer: Medicare Other

## 2020-07-05 ENCOUNTER — Encounter: Payer: Self-pay | Admitting: Internal Medicine

## 2020-07-05 VITALS — BP 100/62 | HR 67 | Temp 97.9°F | Ht 63.0 in | Wt 125.8 lb

## 2020-07-05 DIAGNOSIS — E538 Deficiency of other specified B group vitamins: Secondary | ICD-10-CM

## 2020-07-05 DIAGNOSIS — D509 Iron deficiency anemia, unspecified: Secondary | ICD-10-CM

## 2020-07-05 DIAGNOSIS — I25119 Atherosclerotic heart disease of native coronary artery with unspecified angina pectoris: Secondary | ICD-10-CM

## 2020-07-05 DIAGNOSIS — Z79899 Other long term (current) drug therapy: Secondary | ICD-10-CM | POA: Diagnosis not present

## 2020-07-05 DIAGNOSIS — E785 Hyperlipidemia, unspecified: Secondary | ICD-10-CM | POA: Diagnosis not present

## 2020-07-05 DIAGNOSIS — M810 Age-related osteoporosis without current pathological fracture: Secondary | ICD-10-CM

## 2020-07-05 LAB — COMPREHENSIVE METABOLIC PANEL
ALT: 15 U/L (ref 0–35)
AST: 19 U/L (ref 0–37)
Albumin: 4.2 g/dL (ref 3.5–5.2)
Alkaline Phosphatase: 48 U/L (ref 39–117)
BUN: 16 mg/dL (ref 6–23)
CO2: 29 mEq/L (ref 19–32)
Calcium: 8.9 mg/dL (ref 8.4–10.5)
Chloride: 105 mEq/L (ref 96–112)
Creatinine, Ser: 0.66 mg/dL (ref 0.40–1.20)
GFR: 89.2 mL/min (ref >60.00)
Glucose, Bld: 84 mg/dL (ref 70–99)
Potassium: 4.1 mEq/L (ref 3.5–5.1)
Sodium: 140 mEq/L (ref 135–145)
Total Bilirubin: 0.6 mg/dL (ref 0.2–1.2)
Total Protein: 6.8 g/dL (ref 6.0–8.3)

## 2020-07-05 LAB — CBC
HCT: 38 % (ref 36.0–46.0)
Hemoglobin: 12.9 g/dL (ref 12.0–15.0)
MCHC: 33.9 g/dL (ref 30.0–36.0)
MCV: 88.5 fl (ref 78.0–100.0)
Platelets: 181 10*3/uL (ref 150.0–400.0)
RBC: 4.29 Mil/uL (ref 3.87–5.11)
RDW: 13.5 % (ref 11.5–15.5)
WBC: 5.2 10*3/uL (ref 4.0–10.5)

## 2020-07-05 LAB — VITAMIN B12: Vitamin B-12: 484 pg/mL (ref 211–911)

## 2020-07-05 LAB — FERRITIN: Ferritin: 19.9 ng/mL (ref 10.0–291.0)

## 2020-07-05 MED ORDER — ROSUVASTATIN CALCIUM 5 MG PO TABS: 5.0000 mg | ORAL_TABLET | Freq: Every day | ORAL | 3 refills | Status: DC

## 2020-07-05 NOTE — Progress Notes (Signed)
   Subjective:   Patient ID: Bethany Carter, female    DOB: 02/10/1951, 70 y.o.   MRN: [id number]  HPI The patient is a 70 YO female coming in for transition of care to new provider as her prior left practice. Needs follow up cholesterol (taking crestor 1/2 of her 10 mg pill as the 10 mg caused her problems, taking this dose for several months now), and B12 (taking injections recently started and not sure if she notices much benefit, some more energy), and anemia (she is tired and overall that is worsening in the last year or so, overall stable, B12 shots have not helped).   Review of Systems  Constitutional: Negative.   HENT: Negative.   Eyes: Negative.   Respiratory: Negative for cough, chest tightness and shortness of breath.   Cardiovascular: Negative for chest pain, palpitations and leg swelling.  Gastrointestinal: Negative for abdominal distention, abdominal pain, constipation, diarrhea, nausea and vomiting.  Musculoskeletal: Negative.   Skin: Negative.   Neurological: Negative.   Psychiatric/Behavioral: Negative.     Objective:  Physical Exam Constitutional:      Appearance: She is well-developed.  HENT:     Head: Normocephalic and atraumatic.  Cardiovascular:     Rate and Rhythm: Normal rate and regular rhythm.  Pulmonary:     Effort: Pulmonary effort is normal. No respiratory distress.     Breath sounds: Normal breath sounds. No wheezing or rales.  Abdominal:     General: Bowel sounds are normal. There is no distension.     Palpations: Abdomen is soft.     Tenderness: There is no abdominal tenderness. There is no rebound.  Musculoskeletal:     Cervical back: Normal range of motion.  Skin:    General: Skin is warm and dry.  Neurological:     Mental Status: She is alert and oriented to person, place, and time.     Coordination: Coordination normal.     Vitals:   07/05/20 1047  BP: 100/62  Pulse: 67  Temp: 97.9 F (36.6 C)  TempSrc: Oral  SpO2: 98%   Weight: 125 lb 12.8 oz (57.1 kg)  Height: 5\' 3"  (1.6 m)    This visit occurred during the SARS-CoV-2 public health emergency.  Safety protocols were in place, including screening questions prior to the visit, additional usage of staff PPE, and extensive cleaning of exam room while observing appropriate contact time as indicated for disinfecting solutions.   Assessment & Plan:

## 2020-07-05 NOTE — Patient Instructions (Addendum)
We will recheck the labs today. 

## 2020-07-07 DIAGNOSIS — E538 Deficiency of other specified B group vitamins: Secondary | ICD-10-CM | POA: Insufficient documentation

## 2020-07-07 NOTE — Assessment & Plan Note (Signed)
She is taking crestor 5 mg daily so have changed rx so to get fill pattern appropriate. Denies chest pains currently.

## 2020-07-07 NOTE — Assessment & Plan Note (Signed)
Doing prolia every 6 months.  

## 2020-07-07 NOTE — Assessment & Plan Note (Signed)
Taking monthly B12 shots. Checking levels today. If appropriate can switch to oral replacement.

## 2020-07-07 NOTE — Assessment & Plan Note (Signed)
Checking CBC and adjust as needed. Taking B12 shots currently for low B12.

## 2020-07-12 ENCOUNTER — Telehealth: Payer: Self-pay

## 2020-07-12 NOTE — Telephone Encounter (Signed)
Prolia VOB initiated via [email or web address]  MEDICAL BENEFIT SUMMARY Patient Out-of-Pocket Responsibility Coverage Available Authorization Required Deductible Co-pay/Coinsurance Prolia  OOP COST PHYSICIAN FACILITY FEE ADMIN FEE PURCHASE OR REFERRAL: YES NO PRIMARY $233 Met SECONDARY No $0* $0* $0* *Reflects patient costs once plan deductible is met. Please see Medical Benefit Details for further information regarding patient costs. Patient costs may vary based on services rendered. BENEFITS VERIFIED FOR THE FOLLOWING DIAGNOSIS AND INSURANCE PLANS Verified for Diagnosis M81.0 Site of Care  MD Office  Policy Status: Active  Policy Level: Primary Payer Name: Bethany Carter [facility] Plan Name: Traditional Policy Number: [id number] Employer Name: Plan Type: Part A & B Group Number:  Payer Phone:  Policy Status: Active   Policy Level: Secondary Payer Name: [place] Plan Name: [place] Number: [id number] Employer Name: Plan Type: Standard Supplement F Group Number:  Payer Phone:  PRIMARY MEDICAL BENEFIT DETAILS (PHYSICIAN PURCHASE, OR REFERRAL TO TREATING SITE) COVERAGE AVAILABLE: Yes COVERAGE DETAILS: Benefits subject to a $233.00 deductible ($233.00 met) and 20% co-insurance for the administration and cost of Prolia. The benefits provided on this Verification of Benefits form are Medical Benefits and are the patient's In-Network benefits for Prolia. If you would like Pharmacy Benefits for Prolia, please call [phone]. AUTHORIZATION REQUIRED: No   SECONDARY MEDICAL BENEFIT DETAILS (PHYSICIAN PURCHASE, OR REFERRAL TO TREATING SITE) COVERAGE AVAILABLE: Yes COVERAGE DETAILS: This is a Medicare Supplement Plan F and it covers the Medicare Part B deductible, co-insurance and 100% of the excess charges. The benefits provided on this Verification of Benefits form are Medical Benefits and are the patient's In-Network  benefits for Prolia. If you would like Pharmacy Benefits for Prolia, please call [phone]. AUTHORIZATION REQUIRED: No

## 2020-07-12 NOTE — Telephone Encounter (Signed)
Last OV 05/17/20 Last Prolia inj 01/28/20 Next Prolia inj due: 07/29/20

## 2020-07-12 NOTE — Telephone Encounter (Signed)
Pt ready for scheduling on or after 07/29/20  Out-of-pocket cost due at time of visit: $0.00  Primary: Medicare Prolia co-insurance: 20% ($255) Admin fee co-insurance: 20% ($25)  Secondary: [employer] Prolia co-insurance: covers the Medicare Part B deductible, co-insurance and 100% of the excess charges Admin fee co-insurance: covers the Medicare Part B deductible, co-insurance and 100% of the excess charges  Deductible: $233 of $233 met  Prior Auth: n/a PA# Valid:

## 2020-07-21 DIAGNOSIS — K648 Other hemorrhoids: Secondary | ICD-10-CM | POA: Diagnosis not present

## 2020-07-21 DIAGNOSIS — K5730 Diverticulosis of large intestine without perforation or abscess without bleeding: Secondary | ICD-10-CM | POA: Diagnosis not present

## 2020-07-21 DIAGNOSIS — K625 Hemorrhage of anus and rectum: Secondary | ICD-10-CM | POA: Diagnosis not present

## 2020-07-27 ENCOUNTER — Ambulatory Visit (INDEPENDENT_AMBULATORY_CARE_PROVIDER_SITE_OTHER): Payer: Medicare Other

## 2020-07-27 ENCOUNTER — Other Ambulatory Visit: Payer: Self-pay

## 2020-07-27 DIAGNOSIS — E538 Deficiency of other specified B group vitamins: Secondary | ICD-10-CM | POA: Diagnosis not present

## 2020-07-27 NOTE — Progress Notes (Signed)
Pt here for monthly B12 injection per Dr Sharlet Salina.  B12 1045mcg given IM left deltoid and pt tolerated injection well.  Next B12 injection scheduled for 08/31/20.

## 2020-08-03 NOTE — Telephone Encounter (Signed)
LMTCB to schedule prolia appt

## 2020-08-04 NOTE — Telephone Encounter (Signed)
Prolia scheduled for 08/08/20

## 2020-08-08 ENCOUNTER — Other Ambulatory Visit: Payer: Self-pay

## 2020-08-08 ENCOUNTER — Ambulatory Visit (INDEPENDENT_AMBULATORY_CARE_PROVIDER_SITE_OTHER): Payer: Medicare Other

## 2020-08-08 DIAGNOSIS — M810 Age-related osteoporosis without current pathological fracture: Secondary | ICD-10-CM | POA: Diagnosis not present

## 2020-08-08 MED ORDER — DENOSUMAB 60 MG/ML ~~LOC~~ SOSY
60.0000 mg | PREFILLED_SYRINGE | Freq: Once | SUBCUTANEOUS | Status: AC
  Administered 2020-08-08: 60 mg via SUBCUTANEOUS

## 2020-08-08 NOTE — Progress Notes (Signed)
Prolia injection administered to pt's left arm. Pt tolerated well. °

## 2020-08-09 NOTE — Telephone Encounter (Signed)
Pt received Prolia inj 08/08/20 Next inj dude 02/08/21

## 2020-08-31 ENCOUNTER — Other Ambulatory Visit: Payer: Self-pay

## 2020-08-31 ENCOUNTER — Ambulatory Visit (INDEPENDENT_AMBULATORY_CARE_PROVIDER_SITE_OTHER): Payer: Medicare Other

## 2020-08-31 DIAGNOSIS — E538 Deficiency of other specified B group vitamins: Secondary | ICD-10-CM

## 2020-08-31 NOTE — Progress Notes (Signed)
Pt here for monthly B12 injection per Dr Sharlet Salina.  B12 1081mcg given IM in left deltoid and pt tolerated injection well.  Next B12 injection scheduled for 10/02/20.

## 2020-09-08 DIAGNOSIS — L237 Allergic contact dermatitis due to plants, except food: Secondary | ICD-10-CM | POA: Diagnosis not present

## 2020-09-21 ENCOUNTER — Encounter: Payer: Self-pay | Admitting: Internal Medicine

## 2020-09-21 ENCOUNTER — Ambulatory Visit (INDEPENDENT_AMBULATORY_CARE_PROVIDER_SITE_OTHER): Payer: Medicare Other | Admitting: Internal Medicine

## 2020-09-21 ENCOUNTER — Other Ambulatory Visit: Payer: Self-pay

## 2020-09-21 VITALS — BP 104/66 | HR 71 | Ht 63.0 in | Wt 124.0 lb

## 2020-09-21 DIAGNOSIS — R600 Localized edema: Secondary | ICD-10-CM

## 2020-09-21 DIAGNOSIS — I25119 Atherosclerotic heart disease of native coronary artery with unspecified angina pectoris: Secondary | ICD-10-CM | POA: Diagnosis not present

## 2020-09-21 DIAGNOSIS — I209 Angina pectoris, unspecified: Secondary | ICD-10-CM | POA: Diagnosis not present

## 2020-09-21 DIAGNOSIS — E785 Hyperlipidemia, unspecified: Secondary | ICD-10-CM | POA: Diagnosis not present
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
## 2020-09-21 NOTE — Patient Instructions (Signed)

## 2020-10-02 ENCOUNTER — Other Ambulatory Visit: Payer: Self-pay

## 2020-10-02 ENCOUNTER — Ambulatory Visit (INDEPENDENT_AMBULATORY_CARE_PROVIDER_SITE_OTHER): Payer: Medicare Other

## 2020-10-02 DIAGNOSIS — E538 Deficiency of other specified B group vitamins: Secondary | ICD-10-CM

## 2020-10-02 MED ORDER — CYANOCOBALAMIN 1000 MCG/ML IJ SOLN
1000.0000 ug | Freq: Once | INTRAMUSCULAR | Status: AC
  Administered 2021-06-18: 1000 ug via INTRAMUSCULAR

## 2020-10-02 NOTE — Progress Notes (Signed)
Pt given B12 injection w/o any complications. 

## 2020-11-02 ENCOUNTER — Ambulatory Visit (INDEPENDENT_AMBULATORY_CARE_PROVIDER_SITE_OTHER): Payer: Medicare Other

## 2020-11-02 ENCOUNTER — Other Ambulatory Visit: Payer: Self-pay

## 2020-11-02 DIAGNOSIS — E538 Deficiency of other specified B group vitamins: Secondary | ICD-10-CM

## 2020-11-02 MED ORDER — CYANOCOBALAMIN 1000 MCG/ML IJ SOLN
1000.0000 ug | Freq: Once | INTRAMUSCULAR | Status: AC
  Administered 2020-11-02: 1000 ug via INTRAMUSCULAR

## 2020-11-02 NOTE — Progress Notes (Signed)
B12 given w/o any complications. 
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
## 2020-11-30 ENCOUNTER — Ambulatory Visit (INDEPENDENT_AMBULATORY_CARE_PROVIDER_SITE_OTHER): Payer: Medicare Other | Admitting: Family

## 2020-11-30 ENCOUNTER — Encounter (HOSPITAL_BASED_OUTPATIENT_CLINIC_OR_DEPARTMENT_OTHER): Payer: Self-pay | Admitting: Family

## 2020-11-30 ENCOUNTER — Other Ambulatory Visit: Payer: Self-pay

## 2020-11-30 ENCOUNTER — Ambulatory Visit: Payer: Medicare Other

## 2020-11-30 VITALS — BP 110/80 | HR 76 | Ht 63.0 in | Wt 129.3 lb

## 2020-11-30 DIAGNOSIS — E785 Hyperlipidemia, unspecified: Secondary | ICD-10-CM | POA: Diagnosis not present

## 2020-11-30 DIAGNOSIS — I739 Peripheral vascular disease, unspecified: Secondary | ICD-10-CM

## 2020-11-30 DIAGNOSIS — I25118 Atherosclerotic heart disease of native coronary artery with other forms of angina pectoris: Secondary | ICD-10-CM | POA: Diagnosis not present

## 2020-11-30 DIAGNOSIS — M79604 Pain in right leg: Secondary | ICD-10-CM | POA: Diagnosis not present

## 2020-11-30 LAB — COMPREHENSIVE METABOLIC PANEL
ALT: 23 IU/L (ref 0–32)
AST: 33 IU/L (ref 0–40)
Albumin/Globulin Ratio: 2 (ref 1.2–2.2)
Albumin: 4.5 g/dL (ref 3.8–4.8)
Alkaline Phosphatase: 73 IU/L (ref 44–121)
BUN/Creatinine Ratio: 24 (ref 12–28)
BUN: 19 mg/dL (ref 8–27)
Bilirubin Total: 0.5 mg/dL (ref 0.0–1.2)
CO2: 25 mmol/L (ref 20–29)
Calcium: 9.3 mg/dL (ref 8.7–10.3)
Chloride: 103 mmol/L (ref 96–106)
Creatinine, Ser: 0.8 mg/dL (ref 0.57–1.00)
Globulin, Total: 2.3 g/dL (ref 1.5–4.5)
Glucose: 93 mg/dL (ref 70–99)
Potassium: 4.1 mmol/L (ref 3.5–5.2)
Sodium: 142 mmol/L (ref 134–144)
Total Protein: 6.8 g/dL (ref 6.0–8.5)
eGFR: 79 mL/min/{1.73_m2} (ref >59)

## 2020-11-30 LAB — LIPID PANEL
Chol/HDL Ratio: 2.1 ratio (ref 0.0–4.4)
Cholesterol, Total: 171 mg/dL (ref 100–199)
HDL: 83 mg/dL (ref >39)
LDL Chol Calc (NIH): 73 mg/dL (ref 0–99)
Triglycerides: 79 mg/dL (ref 0–149)
VLDL Cholesterol Cal: 15 mg/dL (ref 5–40)

## 2020-11-30 NOTE — Patient Instructions (Signed)
Medication Instructions:  Your physician recommends that you continue on your current medications as directed. Please refer to the Current Medication list given to you today.  *If you need a refill on your cardiac medications before your next appointment, please call your pharmacy*   Lab Work: Your physician recommends that you have a FASTING lipid profile and CMET drawn today.  Your provider has recommended lab work. Please have this collected at [facility] [place]. The lab is open 8:00 am - 4:30 pm. Please avoid 12:00p - 1:00p for lunch hour. You do not need an appointment. Please go to [address] [place], [place] [address]. This is in the Primary Care office on the 3rd floor, let them know you are there for blood work and they will direct you to the lab.   If you have labs (blood work) drawn today and your tests are completely normal, you will receive your results only by: [place] (if you have MyChart) OR A paper copy in the mail If you have any lab test that is abnormal or we need to change your treatment, we will call you to review the results.   Testing/Procedures: Your physician has requested that you have a lower extremity arterial duplex. During this test, ultrasound is used to evaluate arterial blood flow in the legs. Allow one hour for this exam. There are no restrictions or special instructions.  Your physician has requested that you have an ankle brachial index (ABI). During this test an ultrasound and blood pressure cuff are used to evaluate the arteries that supply the arms and legs with blood. Allow thirty minutes for this exam. There are no restrictions or special instructions.  Follow-Up: At [facility], you and your health needs are our priority.  As part of our continuing mission to provide you with exceptional heart care, we have created designated Provider Care Teams.  These Care Teams include your primary Cardiologist  (physician) and Advanced Practice Providers (APPs -  Physician Assistants and Nurse Practitioners) who all work together to provide you with the care you need, when you need it.  We recommend signing up for the patient portal called "MyChart".  Sign up information is provided on this After Visit Summary.  MyChart is used to connect with patients for Virtual Visits (Telemedicine).  Patients are able to view lab/test results, encounter notes, upcoming appointments, etc.  Non-urgent messages can be sent to your provider as well.   To learn more about what you can do with MyChart, go to [email or web address].    Your next appointment:   July 2023 (We will call you with your results and schedule sooner appointment if abnormal)  The format for your next appointment:   In Person  Provider:   You may see Elouise Munroe, MD or one of the following Advanced Practice Providers on your designated Care Team:   Rosaria Ferries, PA-C Caron Presume, PA-C Jory Sims, DNP, ANP

## 2020-12-01 ENCOUNTER — Telehealth: Payer: Self-pay | Admitting: *Deleted

## 2020-12-01 MED ORDER — EZETIMIBE 10 MG PO TABS: 10.0000 mg | ORAL_TABLET | Freq: Every day | ORAL | 2 refills | Status: DC

## 2020-12-01 NOTE — Telephone Encounter (Signed)
The patient has been notified of the result and verbalized understanding.  All questions (if any) were answered.  Pt is aware to start taking zetia 10 mg po daily, and continue taking her crestor as advised.  Confirmed the pharmacy of choice with the pt. Pt verbalized understanding and agrees with this plan.

## 2020-12-01 NOTE — Telephone Encounter (Signed)
-----   Message from Loel Dubonnet, NP sent at 12/01/2020  7:27 AM EDT ----- Normal kidney, liver, electrolytes.  Cholesterol panel shows total cholesterol, triglycerides, HDL are all at goal.  LDL is 73 instead of a goal of less than 70.  Continue Crestor 2-3 times per week as this has improved myalgias.  Recommend addition of Zetia 10 mg daily.

## 2020-12-07 ENCOUNTER — Ambulatory Visit (INDEPENDENT_AMBULATORY_CARE_PROVIDER_SITE_OTHER): Payer: Medicare Other

## 2020-12-07 ENCOUNTER — Other Ambulatory Visit: Payer: Self-pay

## 2020-12-07 DIAGNOSIS — Z23 Encounter for immunization: Secondary | ICD-10-CM | POA: Diagnosis not present

## 2020-12-07 DIAGNOSIS — E538 Deficiency of other specified B group vitamins: Secondary | ICD-10-CM | POA: Diagnosis not present

## 2020-12-07 MED ORDER — CYANOCOBALAMIN 1000 MCG/ML IJ SOLN
1000.0000 ug | Freq: Once | INTRAMUSCULAR | Status: AC
  Administered 2020-12-07: 1000 ug via INTRAMUSCULAR

## 2020-12-07 NOTE — Progress Notes (Signed)
B12 given w/o any complications.  HD flu vacc given w/o any complications.

## 2020-12-11 ENCOUNTER — Ambulatory Visit (INDEPENDENT_AMBULATORY_CARE_PROVIDER_SITE_OTHER): Payer: Medicare Other

## 2020-12-11 ENCOUNTER — Other Ambulatory Visit: Payer: Self-pay

## 2020-12-11 DIAGNOSIS — I739 Peripheral vascular disease, unspecified: Secondary | ICD-10-CM

## 2020-12-11 DIAGNOSIS — M79604 Pain in right leg: Secondary | ICD-10-CM

## 2020-12-14 ENCOUNTER — Encounter (HOSPITAL_BASED_OUTPATIENT_CLINIC_OR_DEPARTMENT_OTHER): Payer: Self-pay

## 2020-12-25 DIAGNOSIS — U071 COVID-19: Secondary | ICD-10-CM | POA: Diagnosis not present

## 2020-12-26 ENCOUNTER — Telehealth (INDEPENDENT_AMBULATORY_CARE_PROVIDER_SITE_OTHER): Payer: Medicare Other | Admitting: Internal Medicine

## 2020-12-26 ENCOUNTER — Encounter: Payer: Self-pay | Admitting: Internal Medicine

## 2020-12-26 DIAGNOSIS — I209 Angina pectoris, unspecified: Secondary | ICD-10-CM | POA: Diagnosis not present

## 2020-12-26 DIAGNOSIS — U071 COVID-19: Secondary | ICD-10-CM | POA: Insufficient documentation

## 2020-12-26 MED ORDER — MOLNUPIRAVIR EUA 200MG CAPSULE: 4.0000 | ORAL_CAPSULE | Freq: Two times a day (BID) | ORAL | 0 refills | Status: AC

## 2020-12-26 NOTE — Assessment & Plan Note (Signed)
Rx molnupiravir as within window. Advised of otc products she can use for symptoms as well.

## 2020-12-26 NOTE — Progress Notes (Signed)
Virtual Visit via Video Note  I connected with Bethany Carter on 12/26/20 at  3:20 PM EDT by a video enabled telemedicine application and verified that I am speaking with the correct person using two identifiers.  The patient and the provider were at separate locations throughout the entire encounter. Patient location: home, Provider location: work   I discussed the limitations of evaluation and management by telemedicine and the availability of in person appointments. The patient expressed understanding and agreed to proceed. The patient and the provider were the only parties present for the visit unless noted in HPI below.  History of Present Illness: The patient is a 70 y.o. female with visit for cold symptoms. Started Sunday. Covid-19 positive yesterday.  Observations/Objective: Appearance: normal, breathing appears normal, casual grooming, a and O times 3  Assessment and Plan: See problem oriented charting  Follow Up Instructions: rx molnupiravir  I discussed the assessment and treatment plan with the patient. The patient was provided an opportunity to ask questions and all were answered. The patient agreed with the plan and demonstrated an understanding of the instructions.   The patient was advised to call back or seek an in-person evaluation if the symptoms worsen or if the condition fails to improve as anticipated.  Hoyt Koch, MD

## 2020-12-29 ENCOUNTER — Encounter (HOSPITAL_BASED_OUTPATIENT_CLINIC_OR_DEPARTMENT_OTHER): Payer: Self-pay

## 2021-01-01 ENCOUNTER — Telehealth: Payer: Self-pay | Admitting: Internal Medicine

## 2021-01-01 NOTE — Telephone Encounter (Signed)
Called pt. LVM requesting a call back to discuss her concerns.   If pt calls back please find out what her covid follow up questions are.

## 2021-01-01 NOTE — Telephone Encounter (Signed)
Patient requesting a call back   Patient states she has a few Covid follow up questions

## 2021-01-02 NOTE — Telephone Encounter (Signed)
Pt ready for scheduling on or after 12/15/2  Out-of-pocket cost due at time of visit: $0.00  Primary: Medicare [place] co-insurance: 20% (approximately $255) Admin fee co-insurance: 20% (approximately $25)  Secondary: [employer] co-insurance: Covers Medicare Part B co-insurance and deductible.  Admin fee co-insurance: Covers Medicare Part B co-insurance and deductible.   Deductible: $233 of $233 met  Prior Auth: not required PA# Valid:   ** This summary of benefits is an estimation of the patient's out-of-pocket cost. Exact cost may vary based on individual plan coverage.

## 2021-01-03 ENCOUNTER — Encounter: Payer: Self-pay | Admitting: Family Medicine

## 2021-01-03 ENCOUNTER — Other Ambulatory Visit: Payer: Self-pay

## 2021-01-03 ENCOUNTER — Ambulatory Visit (INDEPENDENT_AMBULATORY_CARE_PROVIDER_SITE_OTHER): Payer: Medicare Other | Admitting: Family Medicine

## 2021-01-03 VITALS — BP 102/60 | HR 77 | Temp 96.8°F | Ht 63.0 in | Wt 125.9 lb

## 2021-01-03 DIAGNOSIS — R5383 Other fatigue: Secondary | ICD-10-CM | POA: Diagnosis not present

## 2021-01-03 DIAGNOSIS — U071 COVID-19: Secondary | ICD-10-CM | POA: Diagnosis not present

## 2021-01-03 DIAGNOSIS — J029 Acute pharyngitis, unspecified: Secondary | ICD-10-CM

## 2021-01-03 LAB — POCT RAPID STREP A (OFFICE): Rapid Strep A Screen: NEGATIVE

## 2021-01-03 LAB — POCT INFLUENZA A/B
Influenza A, POC: NEGATIVE
Influenza B, POC: NEGATIVE

## 2021-01-03 NOTE — Patient Instructions (Signed)
Rest, fluids. I would suggest avoiding dairy since stools are loose. Take time with position changes to avoid dizziness. Let me know if any worsening of symptoms (or let Dr. Sharlet Salina know).

## 2021-01-03 NOTE — Progress Notes (Signed)
Bethany Carter DOB: 30-Mar-1950 Encounter date: 01/03/2021  This is a 70 y.o. female who presents with Chief Complaint  Patient presents with   Results    Patient states she initially tested positive for Covid on December 25, 2020, repeat test 2 days ago still positive, states she was given Lagevrio 8 days ago, currently feeling worse   Dizziness    X3-4 days   Fatigue    X1.5 weeks   Nasal Congestion    Patient complains of recurrent runny nose 2 days ago    History of present illness: Antiviral did cause her to have a rash, but she took this except for the very last dose.  Her symptoms did start to improve upon taking antiviral.  Symptoms overall seemed more like a cold.  However in the last 48 hours she has noticed increased congestion, runny nose, sore throat.  She is concerned that she could have a secondary infection or another illness like flu.  She has some fatigue, she has some dizziness that is hard to describe.  Dizziness started Tuesday of last week, notes this on and off.  She has been doing a lot of sitting on the couch, so not noting this all of the time.  Her appetite seems to be improving.  She has no shortness of breath, occasional cough.  She has had some loose stools.  Temperature has been normal in the last few days.  Body aches have improved and throat pain has improved.  Allergies  Allergen Reactions   Codeine Shortness Of Breath and Other (See Comments)    Severe abdominal pain   Percocet [Oxycodone-Acetaminophen] Shortness Of Breath    Intense abd pain   Current Meds  Medication Sig   acetaminophen (TYLENOL) 500 MG tablet Take 500 mg by mouth every 8 (eight) hours as needed for moderate pain.   cetirizine (ZYRTEC) 10 MG tablet Take 10 mg by mouth daily as needed for allergies.   cholecalciferol (VITAMIN D) 1000 units tablet Take 2,000 Units by mouth daily.   Coenzyme Q10 (COQ-10 PO) Take by mouth daily.   denosumab (PROLIA) 60 MG/ML SOSY injection  Inject 60 mg into the skin every 6 (six) months.   ezetimibe (ZETIA) 10 MG tablet Take 1 tablet (10 mg total) by mouth daily.   furosemide (LASIX) 20 MG tablet Take 1 tablet (20 mg total) by mouth daily.   Magnesium 100 MG CAPS Take 300 mg by mouth as directed.   rosuvastatin (CRESTOR) 5 MG tablet Take 5 mg by mouth. 2-3 times per week   tretinoin (RETIN-A) 0.1 % cream APPLY IN THE EVENING TO FACE ONCE A NIGHT PEA SIZE AMOUNT EXTERNALLY 30 DAYS   Current Facility-Administered Medications for the 01/03/21 encounter (Office Visit) with Caren Macadam, MD  Medication   cyanocobalamin ((VITAMIN B-12)) injection 1,000 mcg   cyanocobalamin ((VITAMIN B-12)) injection 1,000 mcg   cyanocobalamin ((VITAMIN B-12)) injection 1,000 mcg    Review of Systems  Constitutional:  Positive for fatigue. Negative for chills and fever.  HENT:  Positive for congestion, postnasal drip and sore throat. Negative for ear pain, sinus pressure, sinus pain and trouble swallowing.   Respiratory:  Positive for cough (occasiona). Negative for shortness of breath and wheezing.   Cardiovascular:  Negative for chest pain.  Neurological:  Positive for dizziness and headaches (better now).   Objective:  BP 102/60 (BP Location: Right Arm, Patient Position: Sitting, Cuff Size: Normal)   Pulse 77   Temp (!) 96.8  F (36 C) (Axillary)   Ht 5\' 3"  (1.6 m)   Wt 125 lb 14.4 oz (57.1 kg)   SpO2 99%   BMI 22.30 kg/m   Weight: 125 lb 14.4 oz (57.1 kg)   BP Readings from Last 3 Encounters:  01/03/21 102/60  11/30/20 110/80  09/21/20 104/66   Wt Readings from Last 3 Encounters:  01/03/21 125 lb 14.4 oz (57.1 kg)  11/30/20 129 lb 4.8 oz (58.7 kg)  09/21/20 124 lb (56.2 kg)    Physical Exam Constitutional:      General: She is not in acute distress.    Appearance: She is well-developed.  HENT:     Right Ear: Tympanic membrane, ear canal and external ear normal.     Left Ear: Tympanic membrane, ear canal and external  ear normal.     Mouth/Throat:     Mouth: Mucous membranes are moist.     Pharynx: Posterior oropharyngeal erythema present. No pharyngeal swelling, oropharyngeal exudate or uvula swelling.     Tonsils: No tonsillar exudate.  Neck:     Thyroid: No thyroid mass or thyromegaly.  Cardiovascular:     Rate and Rhythm: Normal rate and regular rhythm.     Heart sounds: Normal heart sounds. No murmur heard.   No friction rub.  Pulmonary:     Effort: Pulmonary effort is normal. No respiratory distress.     Breath sounds: Normal breath sounds. No decreased breath sounds, wheezing, rhonchi or rales.  Musculoskeletal:     Right lower leg: No edema.     Left lower leg: No edema.  Lymphadenopathy:     Cervical: No cervical adenopathy.  Neurological:     Mental Status: She is alert and oriented to person, place, and time.  Psychiatric:        Behavior: Behavior normal.    Assessment/Plan  1. COVID-19 She is still testing positive on home test for COVID.  We discussed isolation until symptoms have improved.  We discussed that sometimes there are some rebound symptoms after antiviral treatment with COVID.  She may be experiencing these.  We did test her today in the office for strep throat and flu, both of these were negative.  Reassured her of normal lung exam. Discussed that if any worsening of symptoms including productive cough, shortness of breath, fever, sinus pain/pressure, to let us know. Stay well hydrated, rest as needed, take time with position changes. Expect the some of dizziness, fatigue will improve in next 1-2 weeks.   2. Sore throat Negative test in office.  - POC Rapid Strep A  3. Fatigue, unspecified type Negative flu.  - POC Influenza A/B   Return if symptoms worsen or fail to improve.   30 minutes spent in chart review, time with patient, discussion of test results, expectation/management of COVID symptoms, charting.   Micheline Rough, MD

## 2021-01-10 ENCOUNTER — Ambulatory Visit (INDEPENDENT_AMBULATORY_CARE_PROVIDER_SITE_OTHER): Payer: Medicare Other

## 2021-01-10 ENCOUNTER — Other Ambulatory Visit: Payer: Self-pay

## 2021-01-10 DIAGNOSIS — E538 Deficiency of other specified B group vitamins: Secondary | ICD-10-CM | POA: Diagnosis not present

## 2021-01-10 MED ORDER — CYANOCOBALAMIN 1000 MCG/ML IJ SOLN
1000.0000 ug | Freq: Once | INTRAMUSCULAR | Status: AC
  Administered 2021-01-10: 1000 ug via INTRAMUSCULAR

## 2021-01-10 NOTE — Progress Notes (Signed)
Pt given B12 w/o any complications. °

## 2021-02-07 DIAGNOSIS — R2231 Localized swelling, mass and lump, right upper limb: Secondary | ICD-10-CM | POA: Diagnosis not present

## 2021-02-09 ENCOUNTER — Ambulatory Visit: Payer: Medicare Other

## 2021-02-09 ENCOUNTER — Other Ambulatory Visit: Payer: Self-pay | Admitting: Orthopedic Surgery

## 2021-02-09 ENCOUNTER — Telehealth: Payer: Self-pay

## 2021-02-09 NOTE — Telephone Encounter (Signed)
° °  Name: Bethany Carter  DOB: 1950/06/28  MRN: [id number]   Primary Cardiologist: Elouise Munroe, MD  Chart reviewed as part of pre-operative protocol coverage. Patient was contacted 02/09/2021 in reference to pre-operative risk assessment for pending surgery as outlined below.  Bethany Carter was last seen on 11/30/2020 by Laurann Montana.  Since that day, Bethany Carter has done well without exertional chest pain or worsening dyspnea.  Previous cardiac catheterization in 2015 showed no angiographic evidence of CAD.  Therefore, based on ACC/AHA guidelines, the patient would be at acceptable risk for the planned procedure without further cardiovascular testing.   The patient was advised that if she develops new symptoms prior to surgery to contact our office to arrange for a follow-up visit, and she verbalized understanding.  I will route this recommendation to the requesting party via Epic fax function and remove from pre-op pool. Please call with questions.  Almyra Deforest, [place] 02/09/2021, 4:36 PM

## 2021-02-09 NOTE — Telephone Encounter (Signed)
° °  Pre-operative Risk Assessment    Patient Name: Bethany Carter  DOB: Dec 17, 1950 MRN: [id number]      Request for Surgical Clearance    Procedure:   Excision mass right hand  Date of Surgery:  Clearance 03/09/21                                 Surgeon:  Lattie Haw Surgeon's Group or Practice Name:  [facility] Phone number:  [phone] Fax number:  [phone]   Type of Clearance Requested:   - Medical    Type of Anesthesia:   Choice   Additional requests/questions:  Please fax a copy of Clearance to the surgeon's office.  Evalee Mutton   02/09/2021, 2:23 PM

## 2021-02-12 ENCOUNTER — Ambulatory Visit (INDEPENDENT_AMBULATORY_CARE_PROVIDER_SITE_OTHER): Payer: Medicare Other

## 2021-02-12 ENCOUNTER — Other Ambulatory Visit: Payer: Self-pay

## 2021-02-12 DIAGNOSIS — E538 Deficiency of other specified B group vitamins: Secondary | ICD-10-CM | POA: Diagnosis not present

## 2021-02-12 MED ORDER — CYANOCOBALAMIN 1000 MCG/ML IJ SOLN
1000.0000 ug | Freq: Once | INTRAMUSCULAR | Status: AC
  Administered 2021-02-12: 11:00:00 1000 ug via INTRAMUSCULAR

## 2021-02-12 NOTE — Progress Notes (Signed)
Pt given B12 w/o any complications. °

## 2021-02-13 ENCOUNTER — Ambulatory Visit (INDEPENDENT_AMBULATORY_CARE_PROVIDER_SITE_OTHER): Payer: Medicare Other

## 2021-02-13 DIAGNOSIS — M810 Age-related osteoporosis without current pathological fracture: Secondary | ICD-10-CM

## 2021-02-13 MED ORDER — DENOSUMAB 60 MG/ML ~~LOC~~ SOSY
60.0000 mg | PREFILLED_SYRINGE | Freq: Once | SUBCUTANEOUS | Status: AC
Start: 2021-02-13 — End: 2021-02-13
  Administered 2021-02-13: 10:00:00 60 mg via SUBCUTANEOUS

## 2021-02-13 NOTE — Progress Notes (Signed)
Prolia injection administered left arm sub q. Patient tolerated well.

## 2021-03-12 ENCOUNTER — Ambulatory Visit: Payer: Medicare Other

## 2021-03-13 LAB — HM DIABETES EYE EXAM

## 2021-03-14 ENCOUNTER — Ambulatory Visit (INDEPENDENT_AMBULATORY_CARE_PROVIDER_SITE_OTHER): Payer: Medicare Other

## 2021-03-14 ENCOUNTER — Other Ambulatory Visit: Payer: Self-pay

## 2021-03-14 DIAGNOSIS — E538 Deficiency of other specified B group vitamins: Secondary | ICD-10-CM | POA: Diagnosis not present

## 2021-03-14 MED ORDER — CYANOCOBALAMIN 1000 MCG/ML IJ SOLN
1000.0000 ug | Freq: Once | INTRAMUSCULAR | Status: AC
  Administered 2021-03-14: 1000 ug via INTRAMUSCULAR

## 2021-03-14 NOTE — [place] [facility] (Signed)
B12 given and tolerated well °

## 2021-03-16 ENCOUNTER — Ambulatory Visit (HOSPITAL_BASED_OUTPATIENT_CLINIC_OR_DEPARTMENT_OTHER): Admit: 2021-03-16 | Payer: Medicare Other | Admitting: Orthopedic Surgery

## 2021-03-16 ENCOUNTER — Encounter (HOSPITAL_BASED_OUTPATIENT_CLINIC_OR_DEPARTMENT_OTHER): Payer: Self-pay

## 2021-03-16 SURGERY — EXCISION MASS
Anesthesia: Choice | Laterality: Right

## 2021-03-20 DIAGNOSIS — Z23 Encounter for immunization: Secondary | ICD-10-CM | POA: Diagnosis not present

## 2021-03-20 DIAGNOSIS — L570 Actinic keratosis: Secondary | ICD-10-CM | POA: Diagnosis not present

## 2021-03-20 DIAGNOSIS — L821 Other seborrheic keratosis: Secondary | ICD-10-CM | POA: Diagnosis not present

## 2021-03-20 DIAGNOSIS — Q828 Other specified congenital malformations of skin: Secondary | ICD-10-CM | POA: Diagnosis not present

## 2021-03-22 ENCOUNTER — Ambulatory Visit: Payer: Medicare Other | Admitting: Internal Medicine

## 2021-03-26 ENCOUNTER — Ambulatory Visit (INDEPENDENT_AMBULATORY_CARE_PROVIDER_SITE_OTHER): Payer: Medicare Other | Admitting: Internal Medicine

## 2021-03-26 ENCOUNTER — Encounter: Payer: Self-pay | Admitting: Internal Medicine

## 2021-03-26 ENCOUNTER — Ambulatory Visit: Payer: Medicare Other | Admitting: Internal Medicine

## 2021-03-26 ENCOUNTER — Other Ambulatory Visit: Payer: Self-pay

## 2021-03-26 VITALS — BP 120/70 | HR 74 | Ht 63.0 in | Wt 125.6 lb

## 2021-03-26 DIAGNOSIS — H3500 Unspecified background retinopathy: Secondary | ICD-10-CM

## 2021-03-26 DIAGNOSIS — R7309 Other abnormal glucose: Secondary | ICD-10-CM | POA: Diagnosis not present

## 2021-03-26 DIAGNOSIS — E210 Primary hyperparathyroidism: Secondary | ICD-10-CM | POA: Diagnosis not present

## 2021-03-26 DIAGNOSIS — E559 Vitamin D deficiency, unspecified: Secondary | ICD-10-CM

## 2021-03-26 DIAGNOSIS — Z833 Family history of diabetes mellitus: Secondary | ICD-10-CM | POA: Diagnosis not present

## 2021-03-26 DIAGNOSIS — M810 Age-related osteoporosis without current pathological fracture: Secondary | ICD-10-CM

## 2021-03-26 LAB — HEMOGLOBIN A1C: Hgb A1c MFr Bld: 5.3 % (ref 4.6–6.5)

## 2021-03-26 LAB — VITAMIN D 25 HYDROXY (VIT D DEFICIENCY, FRACTURES): VITD: 63.82 ng/mL (ref 30.00–100.00)
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
## 2021-03-26 NOTE — Patient Instructions (Signed)
Please stop at the lab.  Continue: - Prolia - vitamin D 2000 units daily.  Let's get another bone density after 04/13/2021.  Please come back for a follow-up appointment in 1 year.

## 2021-04-04 DIAGNOSIS — Z20822 Contact with and (suspected) exposure to covid-19: Secondary | ICD-10-CM | POA: Diagnosis not present

## 2021-04-06 ENCOUNTER — Encounter: Payer: Self-pay | Admitting: Internal Medicine

## 2021-04-06 DIAGNOSIS — D509 Iron deficiency anemia, unspecified: Secondary | ICD-10-CM

## 2021-04-09 ENCOUNTER — Telehealth: Payer: Self-pay | Admitting: Internal Medicine

## 2021-04-09 NOTE — Telephone Encounter (Signed)
LVM for pt to rtn my call to schedule AWV with [facility].  

## 2021-04-18 NOTE — Telephone Encounter (Signed)
Last Prolia inj 02/13/21 Next Prolia inj 08/15/21

## 2021-04-19 ENCOUNTER — Other Ambulatory Visit (INDEPENDENT_AMBULATORY_CARE_PROVIDER_SITE_OTHER): Payer: Medicare Other

## 2021-04-19 ENCOUNTER — Ambulatory Visit (INDEPENDENT_AMBULATORY_CARE_PROVIDER_SITE_OTHER): Payer: Medicare Other

## 2021-04-19 ENCOUNTER — Other Ambulatory Visit: Payer: Self-pay

## 2021-04-19 DIAGNOSIS — E538 Deficiency of other specified B group vitamins: Secondary | ICD-10-CM | POA: Diagnosis not present

## 2021-04-19 DIAGNOSIS — D509 Iron deficiency anemia, unspecified: Secondary | ICD-10-CM | POA: Diagnosis not present

## 2021-04-19 LAB — CBC
HCT: 40.3 % (ref 36.0–46.0)
Hemoglobin: 13.4 g/dL (ref 12.0–15.0)
MCHC: 33.3 g/dL (ref 30.0–36.0)
MCV: 88.9 fl (ref 78.0–100.0)
Platelets: 200 10*3/uL (ref 150.0–400.0)
RBC: 4.53 Mil/uL (ref 3.87–5.11)
RDW: 13.7 % (ref 11.5–15.5)
WBC: 4.3 10*3/uL (ref 4.0–10.5)

## 2021-04-19 LAB — FERRITIN: Ferritin: 10.4 ng/mL (ref 10.0–291.0)

## 2021-04-19 MED ORDER — CYANOCOBALAMIN 1000 MCG/ML IJ SOLN
1000.0000 ug | Freq: Once | INTRAMUSCULAR | Status: AC
  Administered 2021-04-19: 1000 ug via INTRAMUSCULAR

## 2021-04-19 NOTE — Progress Notes (Signed)
Pt given B12 w/o any complications. °

## 2021-04-26 ENCOUNTER — Encounter: Payer: Self-pay | Admitting: Gastroenterology

## 2021-05-03 DIAGNOSIS — U071 COVID-19: Secondary | ICD-10-CM | POA: Diagnosis not present

## 2021-05-17 ENCOUNTER — Ambulatory Visit (INDEPENDENT_AMBULATORY_CARE_PROVIDER_SITE_OTHER): Payer: Medicare Other

## 2021-05-17 ENCOUNTER — Other Ambulatory Visit: Payer: Self-pay

## 2021-05-17 ENCOUNTER — Ambulatory Visit: Payer: Medicare Other | Admitting: Internal Medicine

## 2021-05-17 DIAGNOSIS — E538 Deficiency of other specified B group vitamins: Secondary | ICD-10-CM

## 2021-05-17 MED ORDER — CYANOCOBALAMIN 1000 MCG/ML IJ SOLN
1000.0000 ug | Freq: Once | INTRAMUSCULAR | Status: AC
  Administered 2021-05-17: 1000 ug via INTRAMUSCULAR

## 2021-05-17 NOTE — Progress Notes (Signed)
After obtaining consent, and per orders of Dr. Sharlet Salina, injection of b12 given by Archie Balboa. Patient tolerated injection well in left deltoid and was instructed to report any adverse reaction to me immediately. ? ?

## 2021-05-23 ENCOUNTER — Other Ambulatory Visit: Payer: Self-pay | Admitting: Obstetrics

## 2021-05-23 DIAGNOSIS — L578 Other skin changes due to chronic exposure to nonionizing radiation: Secondary | ICD-10-CM | POA: Diagnosis not present

## 2021-05-23 DIAGNOSIS — D1801 Hemangioma of skin and subcutaneous tissue: Secondary | ICD-10-CM | POA: Diagnosis not present

## 2021-05-23 DIAGNOSIS — D229 Melanocytic nevi, unspecified: Secondary | ICD-10-CM | POA: Diagnosis not present

## 2021-05-23 DIAGNOSIS — L821 Other seborrheic keratosis: Secondary | ICD-10-CM | POA: Diagnosis not present

## 2021-05-23 DIAGNOSIS — H01111 Allergic dermatitis of right upper eyelid: Secondary | ICD-10-CM | POA: Diagnosis not present

## 2021-05-23 DIAGNOSIS — L814 Other melanin hyperpigmentation: Secondary | ICD-10-CM | POA: Diagnosis not present

## 2021-05-23 DIAGNOSIS — Z1231 Encounter for screening mammogram for malignant neoplasm of breast: Secondary | ICD-10-CM

## 2021-05-23 DIAGNOSIS — Z411 Encounter for cosmetic surgery: Secondary | ICD-10-CM | POA: Diagnosis not present

## 2021-06-16 ENCOUNTER — Other Ambulatory Visit: Payer: Self-pay | Admitting: Cardiology

## 2021-06-18 ENCOUNTER — Ambulatory Visit (INDEPENDENT_AMBULATORY_CARE_PROVIDER_SITE_OTHER): Payer: Medicare Other

## 2021-06-18 DIAGNOSIS — E538 Deficiency of other specified B group vitamins: Secondary | ICD-10-CM

## 2021-06-18 DIAGNOSIS — Z20822 Contact with and (suspected) exposure to covid-19: Secondary | ICD-10-CM | POA: Diagnosis not present

## 2021-06-18 NOTE — Progress Notes (Signed)
Pt here for monthly B12 injection per Dr. Sharlet Salina ? ?B12 1041mg given IM, and pt tolerated injection well. ? ?Next B12 injection scheduled for 07/19/21 ?

## 2021-07-02 DIAGNOSIS — Z20822 Contact with and (suspected) exposure to covid-19: Secondary | ICD-10-CM | POA: Diagnosis not present

## 2021-07-04 ENCOUNTER — Ambulatory Visit
Admission: RE | Admit: 2021-07-04 | Discharge: 2021-07-04 | Disposition: A | Discharge status: Home / Self Care | Payer: Medicare Other | Source: Ambulatory Visit | Attending: Obstetrics | Admitting: Obstetrics

## 2021-07-04 DIAGNOSIS — Z1231 Encounter for screening mammogram for malignant neoplasm of breast: Secondary | ICD-10-CM | POA: Diagnosis not present

## 2021-07-04 NOTE — Telephone Encounter (Signed)
Prolia VOB initiated via [email or web address] ? ?Last Prolia inj 02/13/21 ?Next Prolia inj 08/15/21 ? ? ?

## 2021-07-10 NOTE — Telephone Encounter (Signed)
Pt ready for scheduling on or after 08/15/21 ? ?Out-of-pocket cost due at time of visit: $0 ? ?Primary: Medicare ?Prolia co-insurance: 20% (approximately $276) ?Admin fee co-insurance: 20% (approximately $25) ? ?Secondary: [employer] ?Prolia co-insurance: Covers Medicare Part B co-insurance ?Admin fee co-insurance: Covers Medicare Part B co-insurance ? ?Deductible:  Covered by secondary ? ?Prior Auth: not required ?PA# ?Valid:  ? ?** This summary of benefits is an estimation of the patient's out-of-pocket cost. Exact cost may vary based on individual plan coverage.  ? ?

## 2021-07-11 DIAGNOSIS — H2513 Age-related nuclear cataract, bilateral: Secondary | ICD-10-CM | POA: Diagnosis not present

## 2021-07-13 ENCOUNTER — Ambulatory Visit
Admission: RE | Admit: 2021-07-13 | Discharge: 2021-07-13 | Disposition: A | Discharge status: Home / Self Care | Payer: Medicare Other | Source: Ambulatory Visit | Attending: Internal Medicine | Admitting: Internal Medicine

## 2021-07-13 DIAGNOSIS — M810 Age-related osteoporosis without current pathological fracture: Secondary | ICD-10-CM | POA: Diagnosis not present

## 2021-07-13 DIAGNOSIS — M8588 Other specified disorders of bone density and structure, other site: Secondary | ICD-10-CM | POA: Diagnosis not present

## 2021-07-13 DIAGNOSIS — Z780 Asymptomatic menopausal state: Secondary | ICD-10-CM | POA: Diagnosis not present

## 2021-07-19 ENCOUNTER — Ambulatory Visit: Payer: Medicare Other

## 2021-07-30 ENCOUNTER — Telehealth: Payer: Medicare Other | Admitting: Family Medicine

## 2021-07-30 ENCOUNTER — Encounter: Payer: Self-pay | Admitting: Family Medicine

## 2021-07-30 ENCOUNTER — Ambulatory Visit (INDEPENDENT_AMBULATORY_CARE_PROVIDER_SITE_OTHER): Payer: Medicare Other | Admitting: Family Medicine

## 2021-07-30 VITALS — BP 120/78 | HR 74 | Temp 97.8°F | Ht 63.0 in

## 2021-07-30 DIAGNOSIS — R519 Headache, unspecified: Secondary | ICD-10-CM

## 2021-07-30 DIAGNOSIS — Q828 Other specified congenital malformations of skin: Secondary | ICD-10-CM | POA: Insufficient documentation

## 2021-07-30 DIAGNOSIS — Z411 Encounter for cosmetic surgery: Secondary | ICD-10-CM | POA: Insufficient documentation

## 2021-07-30 DIAGNOSIS — H6692 Otitis media, unspecified, left ear: Secondary | ICD-10-CM | POA: Diagnosis not present

## 2021-07-30 DIAGNOSIS — J309 Allergic rhinitis, unspecified: Secondary | ICD-10-CM

## 2021-07-30 DIAGNOSIS — E538 Deficiency of other specified B group vitamins: Secondary | ICD-10-CM

## 2021-07-30 DIAGNOSIS — H9202 Otalgia, left ear: Secondary | ICD-10-CM

## 2021-07-30 DIAGNOSIS — L821 Other seborrheic keratosis: Secondary | ICD-10-CM | POA: Insufficient documentation

## 2021-07-30 DIAGNOSIS — R0982 Postnasal drip: Secondary | ICD-10-CM

## 2021-07-30 MED ORDER — NEOMYCIN-POLYMYXIN-HC 3.5-10000-1 OT SOLN: 4.0000 [drp] | Freq: Four times a day (QID) | OTIC | 0 refills | Status: DC

## 2021-07-30 MED ORDER — AMOXICILLIN 875 MG PO TABS: 875.0000 mg | ORAL_TABLET | Freq: Two times a day (BID) | ORAL | 0 refills | Status: AC

## 2021-07-30 MED ORDER — TRAMADOL HCL 50 MG PO TABS: 50.0000 mg | ORAL_TABLET | Freq: Two times a day (BID) | ORAL | 0 refills | Status: AC | PRN
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
## 2021-07-30 NOTE — Patient Instructions (Signed)
Take the antibiotic as prescribed and use the eardrops as well.  I prescribed tramadol for pain since you have tolerated this in the past, as discussed.  Be aware this medication may be sedating.  Avoid driving or operating machinery.  Follow-up if you are getting worse or any new symptoms arise that are concerning.

## 2021-07-30 NOTE — Progress Notes (Signed)
Pt here for monthly B12 injection per Dr. Sharlet Salina  B12 1091mg given IM, and pt tolerated injection well.

## 2021-07-30 NOTE — Assessment & Plan Note (Signed)
Amoxil prescribed. Also has inflamed canal and treated with Cortisporin OTIC. Pain not controlled with Tylenol at home. Unable to tolerate NSAIDs. She has done well with Tramadol in the past but dose have a codeine allergy, I confirmed this. Tramadol prescribed.  Discussed symptomatic treatment for allergies.  Follow up if worsening.
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
## 2021-08-21 ENCOUNTER — Ambulatory Visit (INDEPENDENT_AMBULATORY_CARE_PROVIDER_SITE_OTHER): Payer: Medicare Other

## 2021-08-21 DIAGNOSIS — M810 Age-related osteoporosis without current pathological fracture: Secondary | ICD-10-CM

## 2021-08-21 MED ORDER — DENOSUMAB 60 MG/ML ~~LOC~~ SOSY
60.0000 mg | PREFILLED_SYRINGE | Freq: Once | SUBCUTANEOUS | Status: AC
  Administered 2021-08-21: 60 mg via SUBCUTANEOUS

## 2021-08-21 NOTE — Progress Notes (Signed)
Patient seen today for Prolia injection.  '60mg'$ /ml given in left subQ.  Patient tolerated well.  Follow up 6 months or as advised by provider.   Patient verified name, DOB and provided verbal consent prior to administration.

## 2021-08-27 DIAGNOSIS — Z01419 Encounter for gynecological examination (general) (routine) without abnormal findings: Secondary | ICD-10-CM | POA: Diagnosis not present

## 2021-08-27 DIAGNOSIS — Z0142 Encounter for cervical smear to confirm findings of recent normal smear following initial abnormal smear: Secondary | ICD-10-CM | POA: Diagnosis not present

## 2021-08-27 DIAGNOSIS — Z6823 Body mass index (BMI) 23.0-23.9, adult: Secondary | ICD-10-CM | POA: Diagnosis not present

## 2021-08-27 DIAGNOSIS — Z124 Encounter for screening for malignant neoplasm of cervix: Secondary | ICD-10-CM | POA: Diagnosis not present

## 2021-08-27 DIAGNOSIS — Z01411 Encounter for gynecological examination (general) (routine) with abnormal findings: Secondary | ICD-10-CM | POA: Diagnosis not present

## 2021-08-30 ENCOUNTER — Ambulatory Visit (INDEPENDENT_AMBULATORY_CARE_PROVIDER_SITE_OTHER): Payer: Medicare Other

## 2021-08-30 DIAGNOSIS — E538 Deficiency of other specified B group vitamins: Secondary | ICD-10-CM | POA: Diagnosis not present

## 2021-08-30 MED ORDER — CYANOCOBALAMIN 1000 MCG/ML IJ SOLN
1000.0000 ug | Freq: Once | INTRAMUSCULAR | Status: AC
  Administered 2021-08-30: 1000 ug via INTRAMUSCULAR

## 2021-08-30 NOTE — Progress Notes (Signed)
Patient here for B12 injection per Dr. Sharlet Salina. B12 1000 mcg given IM  left arm and patient tolerated injection well today.

## 2021-09-04 ENCOUNTER — Other Ambulatory Visit: Payer: Medicare Other

## 2021-09-09 NOTE — Telephone Encounter (Signed)
Last Prolia inj 08/21/21 Next Prolia inj due 02/21/22 

## 2021-10-01 ENCOUNTER — Ambulatory Visit (INDEPENDENT_AMBULATORY_CARE_PROVIDER_SITE_OTHER): Payer: Medicare Other

## 2021-10-01 DIAGNOSIS — E538 Deficiency of other specified B group vitamins: Secondary | ICD-10-CM | POA: Diagnosis not present

## 2021-10-01 MED ORDER — CYANOCOBALAMIN 1000 MCG/ML IJ SOLN
1000.0000 ug | Freq: Once | INTRAMUSCULAR | Status: AC
  Administered 2021-10-01: 1000 ug via INTRAMUSCULAR

## 2021-10-01 NOTE — Progress Notes (Addendum)
After obtaining consent, and per orders of Dr. Sharlet Salina, injection of B12 given by Max Sane. Patient tolerated injection well in left deltoid and instructed to report any adverse reaction to me immediately.
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
## 2021-10-04 ENCOUNTER — Ambulatory Visit (INDEPENDENT_AMBULATORY_CARE_PROVIDER_SITE_OTHER): Payer: Medicare Other | Admitting: Internal Medicine

## 2021-10-04 ENCOUNTER — Encounter: Payer: Self-pay | Admitting: Internal Medicine

## 2021-10-04 VITALS — BP 100/60 | HR 76 | Ht 62.0 in | Wt 130.0 lb

## 2021-10-04 DIAGNOSIS — E785 Hyperlipidemia, unspecified: Secondary | ICD-10-CM

## 2021-10-04 DIAGNOSIS — I25118 Atherosclerotic heart disease of native coronary artery with other forms of angina pectoris: Secondary | ICD-10-CM | POA: Diagnosis not present

## 2021-10-04 DIAGNOSIS — Z79899 Other long term (current) drug therapy: Secondary | ICD-10-CM

## 2021-10-04 DIAGNOSIS — Z789 Other specified health status: Secondary | ICD-10-CM | POA: Diagnosis not present

## 2021-10-04 DIAGNOSIS — R072 Precordial pain: Secondary | ICD-10-CM

## 2021-10-04 MED ORDER — METOPROLOL TARTRATE 50 MG PO TABS: 50.0000 mg | ORAL_TABLET | Freq: Once | ORAL | 0 refills | Status: DC

## 2021-10-04 NOTE — Patient Instructions (Addendum)
Medication Instructions:   OKAY TO HOLD CRESTOR (ROSUVASTATIN) UNTIL FOLLOW UP APPOINTMENT    PLEASE TAKE METOPROLOL TARTRATE '50mg'$  TWO HOURS PRIOR TO CCTA SCAN   *If you need a refill on your cardiac medications before your next appointment, please call your pharmacy*  Lab Work: BLOOD WORK TODAY  If you have labs (blood work) drawn today and your tests are completely normal, you will receive your results only by: MyChart Message (if you have MyChart) OR A paper copy in the mail If you have any lab test that is abnormal or we need to change your treatment, we will call you to review the results.  Testing/Procedures: Your physician has requested that you have cardiac CT. Cardiac computed tomography (CT) is a painless test that uses an x-ray machine to take clear, detailed pictures of your heart. For further information please visit HugeFiesta[facility]tn. Please follow instruction sheet as given.   Follow-Up: At [facility], you and your health needs are our priority.  As part of our continuing mission to provide you with exceptional heart care, we have created designated Provider Care Teams.  These Care Teams include your primary Cardiologist (physician) and Advanced Practice Providers (APPs -  Physician Assistants and Nurse Practitioners) who all work together to provide you with the care you need, when you need it.  Your next appointment:   SEPTEMBER 18th AT 1PM  The format for your next appointment:   In Person  Provider:   Elouise Munroe, MD      Your cardiac CT will be scheduled at one of the below locations:   [facility] [address] [place], [place] [address] [phone]  If scheduled at [facility], please arrive at the [facility] and Children's Entrance (Entrance C2) of [facility] 30 minutes prior to test start time. You can use the FREE valet parking offered at entrance C (encouraged to control the heart rate for the test)   Proceed to the [facility] Radiology Department (first floor) to check-in and test prep.  All radiology patients and guests should use entrance C2 at [facility], accessed from [facility], even though the hospital's physical address listed is [address]    Please follow these instructions carefully (unless otherwise directed):  On the Night Before the Test: Be sure to Drink plenty of water. Do not consume any caffeinated/decaffeinated beverages or chocolate 12 hours prior to your test. Do not take any antihistamines 12 hours prior to your test.  On the Day of the Test: Drink plenty of water until 1 hour prior to the test. Do not eat any food 4 hours prior to the test. You may take your regular medications prior to the test.  Take metoprolol (Lopressor) two hours prior to test. HOLD Furosemide/Hydrochlorothiazide morning of the test. FEMALES- please wear underwire-free bra if available, avoid dresses & tight clothing  After the Test: Drink plenty of water. After receiving IV contrast, you may experience a mild flushed feeling. This is normal. On occasion, you may experience a mild rash up to 24 hours after the test. This is not dangerous. If this occurs, you can take Benadryl 25 mg and increase your fluid intake. If you experience trouble breathing, this can be serious. If it is severe call 911 IMMEDIATELY. If it is mild, please call our office. If you take any of these medications: Glipizide/Metformin, Avandament, Glucavance, please do not take 48 hours after completing test unless otherwise instructed.  We will call to schedule your test 2-4 weeks out understanding that some insurance companies will need an authorization prior to the service being performed.   For non-scheduling related questions, please contact the cardiac imaging nurse navigator should you have any questions/concerns: Marchia Bond, Cardiac Imaging Nurse Navigator Gordy Clement,  Cardiac Imaging Nurse Navigator [place] [facility] Direct Office Dial: [phone]   For scheduling needs, including cancellations and rescheduling, please call [place], [phone].

## 2021-10-05 LAB — BASIC METABOLIC PANEL
BUN/Creatinine Ratio: 18 (ref 12–28)
BUN: 15 mg/dL (ref 8–27)
CO2: 27 mmol/L (ref 20–29)
Calcium: 9.3 mg/dL (ref 8.7–10.3)
Chloride: 99 mmol/L (ref 96–106)
Creatinine, Ser: 0.82 mg/dL (ref 0.57–1.00)
Glucose: 83 mg/dL (ref 70–99)
Potassium: 4.3 mmol/L (ref 3.5–5.2)
Sodium: 142 mmol/L (ref 134–144)
eGFR: 76 mL/min/{1.73_m2} (ref >59)

## 2021-10-05 LAB — LIPID PANEL
Chol/HDL Ratio: 2.2 ratio (ref 0.0–4.4)
Cholesterol, Total: 198 mg/dL (ref 100–199)
HDL: 91 mg/dL (ref >39)
LDL Chol Calc (NIH): 90 mg/dL (ref 0–99)
Triglycerides: 97 mg/dL (ref 0–149)
VLDL Cholesterol Cal: 17 mg/dL (ref 5–40)

## 2021-10-05 LAB — TSH: TSH: 1.58 u[IU]/mL (ref 0.450–4.500)

## 2021-10-05 LAB — LIPOPROTEIN A (LPA): Lipoprotein (a): 29.5 nmol/L (ref <75.0)

## 2021-10-05 LAB — APOLIPOPROTEIN B: Apolipoprotein B: 74 mg/dL (ref <90)

## 2021-10-12 ENCOUNTER — Other Ambulatory Visit: Payer: Self-pay | Admitting: Family

## 2021-10-15 NOTE — Telephone Encounter (Signed)
Pt of Dr. Margaretann Loveless. Please review for refill. Thank you!

## 2021-10-22 ENCOUNTER — Ambulatory Visit: Payer: Medicare Other | Admitting: Internal Medicine

## 2021-10-22 ENCOUNTER — Telehealth (HOSPITAL_COMMUNITY): Payer: Self-pay | Admitting: Emergency Medicine

## 2021-10-22 NOTE — Telephone Encounter (Signed)
Reaching out to patient to offer assistance regarding upcoming cardiac imaging study; pt verbalizes understanding of appt date/time, parking situation and where to check in, pre-test NPO status and medications ordered, and verified current allergies; name and call back number provided for further questions should they arise Marchia Bond RN Navigator Cardiac Imaging [facility] [phone] office [phone] cell  Arrival 330 w/c entrance Denies iv issues '50mg'$  metoprolol tartrate Holding lasix

## 2021-10-24 ENCOUNTER — Ambulatory Visit (HOSPITAL_COMMUNITY)
Admission: RE | Admit: 2021-10-24 | Discharge: 2021-10-24 | Disposition: A | Discharge status: Home / Self Care | Payer: Medicare Other | Source: Ambulatory Visit | Attending: Internal Medicine | Admitting: Internal Medicine

## 2021-10-24 DIAGNOSIS — R072 Precordial pain: Secondary | ICD-10-CM | POA: Insufficient documentation

## 2021-10-24 MED ORDER — IOHEXOL 350 MG/ML SOLN
100.0000 mL | Freq: Once | INTRAVENOUS | Status: AC | PRN
  Administered 2021-10-24: 100 mL via INTRAVENOUS

## 2021-10-24 MED ORDER — NITROGLYCERIN 0.4 MG SL SUBL
SUBLINGUAL_TABLET | SUBLINGUAL | Status: AC
  Filled 2021-10-24: qty 2

## 2021-10-24 MED ORDER — NITROGLYCERIN 0.4 MG SL SUBL
0.8000 mg | SUBLINGUAL_TABLET | Freq: Once | SUBLINGUAL | Status: AC
  Administered 2021-10-24: 0.8 mg via SUBLINGUAL

## 2021-11-05 ENCOUNTER — Ambulatory Visit (INDEPENDENT_AMBULATORY_CARE_PROVIDER_SITE_OTHER): Payer: Medicare Other

## 2021-11-05 DIAGNOSIS — E538 Deficiency of other specified B group vitamins: Secondary | ICD-10-CM | POA: Diagnosis not present

## 2021-11-05 MED ORDER — CYANOCOBALAMIN 1000 MCG/ML IJ SOLN
1000.0000 ug | Freq: Once | INTRAMUSCULAR | Status: AC
  Administered 2021-11-05: 1000 ug via INTRAMUSCULAR

## 2021-11-05 NOTE — Progress Notes (Signed)
After obtaining consent, and per orders of Dr. Sharlet Salina, injection of B12 given om left deltoid by Marrian Salvage. Patient instructed to report any adverse reaction to me immediately.

## 2021-11-08 DIAGNOSIS — R2232 Localized swelling, mass and lump, left upper limb: Secondary | ICD-10-CM | POA: Diagnosis not present

## 2021-11-09 ENCOUNTER — Other Ambulatory Visit: Payer: Self-pay | Admitting: Obstetrics and Gynecology

## 2021-11-09 DIAGNOSIS — R2232 Localized swelling, mass and lump, left upper limb: Secondary | ICD-10-CM

## 2021-11-12 ENCOUNTER — Encounter: Payer: Self-pay | Admitting: Internal Medicine

## 2021-11-12 ENCOUNTER — Ambulatory Visit: Payer: Medicare Other | Attending: Internal Medicine | Admitting: Internal Medicine

## 2021-11-12 VITALS — BP 118/78 | HR 63 | Ht 62.0 in | Wt 127.6 lb

## 2021-11-12 DIAGNOSIS — R072 Precordial pain: Secondary | ICD-10-CM | POA: Diagnosis not present

## 2021-11-12 DIAGNOSIS — Z789 Other specified health status: Secondary | ICD-10-CM | POA: Diagnosis not present

## 2021-11-12 DIAGNOSIS — E785 Hyperlipidemia, unspecified: Secondary | ICD-10-CM | POA: Diagnosis not present
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
## 2021-11-12 NOTE — Patient Instructions (Signed)
Medication Instructions:  No Changes In Medications at this time.  *If you need a refill on your cardiac medications before your next appointment, please call your pharmacy*  Lab Work: Please return for FASTING Blood Work in [place]. No appointment needed, lab here at the office is open Monday-Friday from 8AM to 4PM and closed daily for lunch from 12:45-1:45.   If you have labs (blood work) drawn today and your tests are completely normal, you will receive your results only by: [place] (if you have MyChart) OR A paper copy in the mail If you have any lab test that is abnormal or we need to change your treatment, we will call you to review the results.  Testing/Procedures: None Ordered At This Time.   Follow-Up: At [facility], you and your health needs are our priority.  As part of our continuing mission to provide you with exceptional heart care, we have created designated Provider Care Teams.  These Care Teams include your primary Cardiologist (physician) and Advanced Practice Providers (APPs -  Physician Assistants and Nurse Practitioners) who all work together to provide you with the care you need, when you need it.  Your next appointment:   6 month(s)  The format for your next appointment:   In Person  Provider:   Elouise Munroe, MD

## 2021-11-21 ENCOUNTER — Telehealth: Payer: Self-pay | Admitting: Internal Medicine

## 2021-11-21 NOTE — Telephone Encounter (Signed)
Patient stated she is not available to do a  AWV. She will call back to schedule.

## 2021-12-06 ENCOUNTER — Ambulatory Visit (INDEPENDENT_AMBULATORY_CARE_PROVIDER_SITE_OTHER): Payer: Medicare Other

## 2021-12-06 DIAGNOSIS — E538 Deficiency of other specified B group vitamins: Secondary | ICD-10-CM | POA: Diagnosis not present

## 2021-12-06 DIAGNOSIS — Z23 Encounter for immunization: Secondary | ICD-10-CM

## 2021-12-06 MED ORDER — CYANOCOBALAMIN 1000 MCG/ML IJ SOLN
1000.0000 ug | Freq: Once | INTRAMUSCULAR | Status: AC
  Administered 2021-12-06: 1000 ug via INTRAMUSCULAR

## 2021-12-06 NOTE — Progress Notes (Signed)
Patient here for monthly B12 injection per Dr. Crawford. B12 1000 mcg given in right IM and patient tolerated injection well today.  

## 2021-12-24 DIAGNOSIS — L820 Inflamed seborrheic keratosis: Secondary | ICD-10-CM | POA: Diagnosis not present

## 2021-12-24 DIAGNOSIS — Z411 Encounter for cosmetic surgery: Secondary | ICD-10-CM | POA: Diagnosis not present

## 2021-12-28 ENCOUNTER — Ambulatory Visit
Admission: RE | Admit: 2021-12-28 | Discharge: 2021-12-28 | Disposition: A | Discharge status: Home / Self Care | Payer: Medicare Other | Source: Ambulatory Visit | Attending: Obstetrics and Gynecology | Admitting: Obstetrics and Gynecology

## 2021-12-28 DIAGNOSIS — R2232 Localized swelling, mass and lump, left upper limb: Secondary | ICD-10-CM | POA: Diagnosis not present

## 2021-12-28 DIAGNOSIS — R92332 Mammographic heterogeneous density, left breast: Secondary | ICD-10-CM | POA: Diagnosis not present

## 2021-12-28 DIAGNOSIS — N6489 Other specified disorders of breast: Secondary | ICD-10-CM | POA: Diagnosis not present

## 2022-01-07 ENCOUNTER — Ambulatory Visit (INDEPENDENT_AMBULATORY_CARE_PROVIDER_SITE_OTHER): Payer: Medicare Other

## 2022-01-07 DIAGNOSIS — E538 Deficiency of other specified B group vitamins: Secondary | ICD-10-CM | POA: Diagnosis not present

## 2022-01-07 MED ORDER — CYANOCOBALAMIN 1000 MCG/ML IJ SOLN
1000.0000 ug | Freq: Once | INTRAMUSCULAR | Status: AC
  Administered 2022-01-07: 1000 ug via INTRAMUSCULAR

## 2022-01-07 NOTE — Progress Notes (Signed)
Pt was given monthly A57 w/o any complications, questions or concerns at this time.

## 2022-01-14 NOTE — Telephone Encounter (Signed)
Prolia VOB initiated via [email or web address]  Last Prolia inj 08/21/21 Next Prolia inj due 02/21/22

## 2022-02-01 ENCOUNTER — Ambulatory Visit (INDEPENDENT_AMBULATORY_CARE_PROVIDER_SITE_OTHER): Payer: Medicare Other | Admitting: *Deleted

## 2022-02-01 DIAGNOSIS — Z0000 Encounter for general adult medical examination without abnormal findings: Secondary | ICD-10-CM | POA: Diagnosis not present

## 2022-02-01 NOTE — Patient Instructions (Signed)
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
## 2022-02-04 DIAGNOSIS — K3189 Other diseases of stomach and duodenum: Secondary | ICD-10-CM | POA: Diagnosis not present

## 2022-02-04 DIAGNOSIS — K2950 Unspecified chronic gastritis without bleeding: Secondary | ICD-10-CM | POA: Diagnosis not present

## 2022-02-04 DIAGNOSIS — K222 Esophageal obstruction: Secondary | ICD-10-CM | POA: Diagnosis not present

## 2022-02-04 DIAGNOSIS — R1310 Dysphagia, unspecified: Secondary | ICD-10-CM | POA: Diagnosis not present

## 2022-02-04 DIAGNOSIS — R112 Nausea with vomiting, unspecified: Secondary | ICD-10-CM | POA: Diagnosis not present

## 2022-02-05 ENCOUNTER — Other Ambulatory Visit: Payer: Self-pay

## 2022-02-05 DIAGNOSIS — E785 Hyperlipidemia, unspecified: Secondary | ICD-10-CM | POA: Diagnosis not present

## 2022-02-06 LAB — LIPID PANEL
Chol/HDL Ratio: 2.2 ratio (ref 0.0–4.4)
Cholesterol, Total: 168 mg/dL (ref 100–199)
HDL: 76 mg/dL (ref >39)
LDL Chol Calc (NIH): 78 mg/dL (ref 0–99)
Triglycerides: 76 mg/dL (ref 0–149)
VLDL Cholesterol Cal: 14 mg/dL (ref 5–40)

## 2022-02-11 ENCOUNTER — Ambulatory Visit: Payer: Medicare Other | Admitting: Internal Medicine

## 2022-02-11 ENCOUNTER — Ambulatory Visit: Payer: Medicare Other

## 2022-02-14 ENCOUNTER — Ambulatory Visit (INDEPENDENT_AMBULATORY_CARE_PROVIDER_SITE_OTHER): Payer: Medicare Other

## 2022-02-14 DIAGNOSIS — E538 Deficiency of other specified B group vitamins: Secondary | ICD-10-CM | POA: Diagnosis not present

## 2022-02-14 MED ORDER — CYANOCOBALAMIN 1000 MCG/ML IJ SOLN: 1000.0000 ug | Freq: Once | INTRAMUSCULAR | Status: DC

## 2022-02-14 MED ORDER — CYANOCOBALAMIN 1000 MCG/ML IJ SOLN
1000.0000 ug | Freq: Once | INTRAMUSCULAR | Status: AC
  Administered 2022-02-14: 1000 ug via INTRAMUSCULAR

## 2022-02-14 NOTE — Progress Notes (Signed)
Pt was given B12 injection w/o any complications. 

## 2022-02-19 NOTE — Telephone Encounter (Signed)
Appointment 02/22/22

## 2022-02-19 NOTE — Telephone Encounter (Signed)
Pt ready for scheduling on or after 02/21/22  Out-of-pocket cost due at time of visit: $0  Primary: Medicare Prolia co-insurance: 20% (approximately $276) Admin fee co-insurance: 20% (approximately $25)  Deductible: $226 of $226 met  Secondary: [employer] Plan F Prolia co-insurance: Covers Medicare Part B co-insurance Admin fee co-insurance: Covers Medicare Part B co-insurance  Deductible:  Covered by secondary  Prior Auth: NOT required PA# Valid:   ** This summary of benefits is an estimation of the patient's out-of-pocket cost. Exact cost may vary based on individual plan coverage.

## 2022-02-22 ENCOUNTER — Ambulatory Visit (INDEPENDENT_AMBULATORY_CARE_PROVIDER_SITE_OTHER): Payer: Medicare Other

## 2022-02-22 VITALS — BP 118/72 | HR 85 | Ht 62.0 in | Wt 128.8 lb

## 2022-02-22 DIAGNOSIS — M810 Age-related osteoporosis without current pathological fracture: Secondary | ICD-10-CM | POA: Diagnosis not present

## 2022-02-22 MED ORDER — DENOSUMAB 60 MG/ML ~~LOC~~ SOSY
60.0000 mg | PREFILLED_SYRINGE | Freq: Once | SUBCUTANEOUS | Status: AC
  Administered 2022-02-22: 60 mg via SUBCUTANEOUS

## 2022-02-22 NOTE — Progress Notes (Signed)
Patient verbally confirmed name, date of birth, and correct medication to be administered. Prolia injection administered and pt tolerated well.  

## 2022-03-14 NOTE — Telephone Encounter (Signed)
Last Prolia inj 02/22/22 Next Prolia inj due 08/25/22

## 2022-03-18 ENCOUNTER — Ambulatory Visit (INDEPENDENT_AMBULATORY_CARE_PROVIDER_SITE_OTHER): Payer: Medicare HMO

## 2022-03-18 DIAGNOSIS — E538 Deficiency of other specified B group vitamins: Secondary | ICD-10-CM

## 2022-03-18 MED ORDER — CYANOCOBALAMIN 1000 MCG/ML IJ SOLN
1000.0000 ug | Freq: Once | INTRAMUSCULAR | Status: AC
  Administered 2022-03-18: 1000 ug via INTRAMUSCULAR

## 2022-03-18 NOTE — Progress Notes (Signed)
Pt is schedule to see PCP on 04/04/22 @ 10: am

## 2022-03-18 NOTE — Progress Notes (Signed)
After obtaining consent, and per orders of Dr. Sharlet Salina, injection of B12 given by Marrian Salvage. Patient instructed to report any adverse reaction to me immediately.

## 2022-03-28 ENCOUNTER — Ambulatory Visit: Payer: Medicare HMO | Admitting: Internal Medicine

## 2022-03-28 ENCOUNTER — Encounter: Payer: Self-pay | Admitting: Internal Medicine

## 2022-03-28 VITALS — BP 112/72 | HR 72 | Ht 62.0 in | Wt 124.2 lb

## 2022-03-28 DIAGNOSIS — E559 Vitamin D deficiency, unspecified: Secondary | ICD-10-CM

## 2022-03-28 DIAGNOSIS — E210 Primary hyperparathyroidism: Secondary | ICD-10-CM

## 2022-03-28 DIAGNOSIS — H3500 Unspecified background retinopathy: Secondary | ICD-10-CM

## 2022-03-28 DIAGNOSIS — M810 Age-related osteoporosis without current pathological fracture: Secondary | ICD-10-CM

## 2022-03-28 LAB — HEMOGLOBIN A1C: Hgb A1c MFr Bld: 5.5 % (ref 4.6–6.5)

## 2022-03-28 LAB — VITAMIN D 25 HYDROXY (VIT D DEFICIENCY, FRACTURES): VITD: 50.78 ng/mL (ref 30.00–100.00)

## 2022-03-28 NOTE — Patient Instructions (Signed)
Please stop at the lab.  Continue: - Prolia - vitamin D 2000 units daily.  Please come back for a follow-up appointment in 1 year.
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
## 2022-04-04 ENCOUNTER — Encounter: Payer: Self-pay | Admitting: Internal Medicine

## 2022-04-04 ENCOUNTER — Ambulatory Visit (INDEPENDENT_AMBULATORY_CARE_PROVIDER_SITE_OTHER): Payer: Medicare HMO | Admitting: Internal Medicine

## 2022-04-04 VITALS — BP 120/80 | HR 69 | Temp 97.7°F | Ht 62.0 in | Wt 125.0 lb

## 2022-04-04 DIAGNOSIS — I5032 Chronic diastolic (congestive) heart failure: Secondary | ICD-10-CM | POA: Diagnosis not present

## 2022-04-04 DIAGNOSIS — M810 Age-related osteoporosis without current pathological fracture: Secondary | ICD-10-CM | POA: Diagnosis not present

## 2022-04-04 DIAGNOSIS — D509 Iron deficiency anemia, unspecified: Secondary | ICD-10-CM

## 2022-04-04 DIAGNOSIS — Z0000 Encounter for general adult medical examination without abnormal findings: Secondary | ICD-10-CM

## 2022-04-04 DIAGNOSIS — E538 Deficiency of other specified B group vitamins: Secondary | ICD-10-CM | POA: Diagnosis not present

## 2022-04-04 NOTE — Progress Notes (Signed)
   Subjective:   Patient ID: Bethany Carter, female    DOB: Feb 07, 1951, 72 y.o.   MRN: [id number]  HPI The patient is here for physical.  PMH, [facility], social history reviewed and updated  Review of Systems  Constitutional: Negative.   HENT: Negative.    Eyes: Negative.   Respiratory:  Negative for cough, chest tightness and shortness of breath.   Cardiovascular:  Negative for chest pain, palpitations and leg swelling.  Gastrointestinal:  Negative for abdominal distention, abdominal pain, constipation, diarrhea, nausea and vomiting.  Musculoskeletal: Negative.   Skin: Negative.   Neurological: Negative.   Psychiatric/Behavioral: Negative.      Objective:  Physical Exam Constitutional:      Appearance: She is well-developed.  HENT:     Head: Normocephalic and atraumatic.  Cardiovascular:     Rate and Rhythm: Normal rate and regular rhythm.  Pulmonary:     Effort: Pulmonary effort is normal. No respiratory distress.     Breath sounds: Normal breath sounds. No wheezing or rales.  Abdominal:     General: Bowel sounds are normal. There is no distension.     Palpations: Abdomen is soft.     Tenderness: There is no abdominal tenderness. There is no rebound.  Musculoskeletal:     Cervical back: Normal range of motion.  Skin:    General: Skin is warm and dry.  Neurological:     Mental Status: She is alert and oriented to person, place, and time.     Coordination: Coordination normal.     Vitals:   04/04/22 0958  BP: 120/80  Pulse: 69  Temp: 97.7 F (36.5 C)  TempSrc: Oral  SpO2: 96%  Weight: 125 lb (56.7 kg)  Height: '5\' 2"'$  (9.735 m)    Assessment & Plan:

## 2022-04-05 NOTE — Assessment & Plan Note (Signed)
Continue prolia every 6 months. Recent calcium level acceptable for ongoing care. No current fracture.

## 2022-04-05 NOTE — Assessment & Plan Note (Signed)
Flu shot up to date. Covid-19 counseled. Pneumonia complete. Shingrix complete. Tetanus due 2024. Colonoscopy unsure date thinks she had done in 2022 and due in 5 years. Mammogram due 2025, pap smear aged out and dexa due 2025. Counseled about sun safety and mole surveillance. Counseled about the dangers of distracted driving. Given 10 year screening recommendations.

## 2022-04-05 NOTE — Assessment & Plan Note (Signed)
No flare today using lasix 20 mg daily. Continue.

## 2022-04-05 NOTE — Assessment & Plan Note (Signed)
Needs to continue monthly B12 injections lifelong. Continue.

## 2022-04-05 NOTE — Assessment & Plan Note (Signed)
Recent labs stable.

## 2022-04-18 ENCOUNTER — Ambulatory Visit (INDEPENDENT_AMBULATORY_CARE_PROVIDER_SITE_OTHER): Payer: Medicare HMO

## 2022-04-18 DIAGNOSIS — E538 Deficiency of other specified B group vitamins: Secondary | ICD-10-CM | POA: Diagnosis not present

## 2022-04-18 MED ORDER — CYANOCOBALAMIN 1000 MCG/ML IJ SOLN
1000.0000 ug | Freq: Once | INTRAMUSCULAR | Status: AC
  Administered 2022-04-18: 1000 ug via INTRAMUSCULAR

## 2022-04-18 NOTE — Progress Notes (Signed)
Pt here for monthly B12 injection per Dr. Sharlet Salina  B12 1028mg given IM, and pt tolerated injection well.  Next B12 injection scheduled for 05/20/22

## 2022-04-24 ENCOUNTER — Other Ambulatory Visit: Payer: Self-pay | Admitting: Family

## 2022-04-24 NOTE — Telephone Encounter (Signed)
Rx(s) sent to pharmacy electronically.  
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
## 2022-05-15 ENCOUNTER — Encounter: Payer: Self-pay | Admitting: Internal Medicine

## 2022-05-15 ENCOUNTER — Ambulatory Visit: Payer: Medicare HMO | Attending: Internal Medicine | Admitting: Internal Medicine

## 2022-05-15 VITALS — BP 116/68 | HR 64 | Ht 62.5 in | Wt 130.0 lb

## 2022-05-15 DIAGNOSIS — E785 Hyperlipidemia, unspecified: Secondary | ICD-10-CM | POA: Diagnosis not present

## 2022-05-15 DIAGNOSIS — I25118 Atherosclerotic heart disease of native coronary artery with other forms of angina pectoris: Secondary | ICD-10-CM

## 2022-05-15 DIAGNOSIS — R600 Localized edema: Secondary | ICD-10-CM

## 2022-05-15 DIAGNOSIS — Z79899 Other long term (current) drug therapy: Secondary | ICD-10-CM | POA: Diagnosis not present

## 2022-05-15 DIAGNOSIS — R079 Chest pain, unspecified: Secondary | ICD-10-CM | POA: Diagnosis not present

## 2022-05-15 DIAGNOSIS — Z789 Other specified health status: Secondary | ICD-10-CM | POA: Diagnosis not present

## 2022-05-15 MED ORDER — EZETIMIBE 10 MG PO TABS: 10.0000 mg | ORAL_TABLET | Freq: Every day | ORAL | 3 refills | Status: DC

## 2022-05-15 NOTE — Patient Instructions (Signed)
Medication Instructions:  No Changes In Medications at this time.  *If you need a refill on your cardiac medications before your next appointment, please call your pharmacy*  Follow-Up: At  [facility], you and your health needs are our priority.  As part of our continuing mission to provide you with exceptional heart care, we have created designated Provider Care Teams.  These Care Teams include your primary Cardiologist (physician) and Advanced Practice Providers (APPs -  Physician Assistants and Nurse Practitioners) who all work together to provide you with the care you need, when you need it.  Your next appointment:   1 year(s)  Provider:   Gayatri A Acharya, MD    

## 2022-05-17 ENCOUNTER — Ambulatory Visit: Payer: Medicare HMO | Admitting: Internal Medicine

## 2022-05-20 ENCOUNTER — Ambulatory Visit (INDEPENDENT_AMBULATORY_CARE_PROVIDER_SITE_OTHER): Payer: Medicare HMO | Admitting: Radiology

## 2022-05-20 DIAGNOSIS — E538 Deficiency of other specified B group vitamins: Secondary | ICD-10-CM | POA: Diagnosis not present

## 2022-05-20 MED ORDER — CYANOCOBALAMIN 1000 MCG/ML IJ SOLN
1000.0000 ug | Freq: Once | INTRAMUSCULAR | Status: AC
  Administered 2022-05-20: 1000 ug via INTRAMUSCULAR

## 2022-05-20 NOTE — Progress Notes (Signed)
Patient here for b12 injection and Tolerated well.

## 2022-06-05 ENCOUNTER — Other Ambulatory Visit: Payer: Self-pay | Admitting: Obstetrics

## 2022-06-05 DIAGNOSIS — Z1231 Encounter for screening mammogram for malignant neoplasm of breast: Secondary | ICD-10-CM

## 2022-06-19 DIAGNOSIS — L821 Other seborrheic keratosis: Secondary | ICD-10-CM | POA: Diagnosis not present

## 2022-06-19 DIAGNOSIS — L814 Other melanin hyperpigmentation: Secondary | ICD-10-CM | POA: Diagnosis not present

## 2022-06-19 DIAGNOSIS — D229 Melanocytic nevi, unspecified: Secondary | ICD-10-CM | POA: Diagnosis not present

## 2022-06-19 DIAGNOSIS — L578 Other skin changes due to chronic exposure to nonionizing radiation: Secondary | ICD-10-CM | POA: Diagnosis not present

## 2022-06-24 ENCOUNTER — Ambulatory Visit (INDEPENDENT_AMBULATORY_CARE_PROVIDER_SITE_OTHER): Payer: Medicare HMO | Admitting: *Deleted

## 2022-06-24 DIAGNOSIS — E538 Deficiency of other specified B group vitamins: Secondary | ICD-10-CM

## 2022-06-24 MED ORDER — CYANOCOBALAMIN 1000 MCG/ML IJ SOLN
1000.0000 ug | Freq: Once | INTRAMUSCULAR | Status: AC
  Administered 2022-06-24: 1000 ug via INTRAMUSCULAR

## 2022-06-24 NOTE — Progress Notes (Signed)
Pls cosign for B12 inj../lmb  

## 2022-07-11 NOTE — Telephone Encounter (Signed)
VOB initiated °

## 2022-07-15 ENCOUNTER — Ambulatory Visit
Admission: RE | Admit: 2022-07-15 | Discharge: 2022-07-15 | Disposition: A | Discharge status: Home / Self Care | Payer: Medicare HMO | Source: Ambulatory Visit | Attending: Obstetrics | Admitting: Obstetrics

## 2022-07-15 DIAGNOSIS — Z1231 Encounter for screening mammogram for malignant neoplasm of breast: Secondary | ICD-10-CM | POA: Diagnosis not present

## 2022-07-16 NOTE — Telephone Encounter (Signed)
Prior Auth required for PROLIA  PA PROCESS DETAILS: PA is required and is currently not on file. Call [phone] or complete the Precertification form available at [email or web address] Fax completed form to [phone]. Or submit PA request online at [email or web address]

## 2022-07-17 NOTE — Telephone Encounter (Signed)
Prior Authorization initiated for [facility] via [employer] Case ID: [id number]     '

## 2022-07-25 ENCOUNTER — Ambulatory Visit (INDEPENDENT_AMBULATORY_CARE_PROVIDER_SITE_OTHER): Payer: Medicare HMO | Admitting: *Deleted

## 2022-07-25 DIAGNOSIS — E538 Deficiency of other specified B group vitamins: Secondary | ICD-10-CM

## 2022-07-25 MED ORDER — CYANOCOBALAMIN 1000 MCG/ML IJ SOLN
1000.0000 ug | Freq: Once | INTRAMUSCULAR | Status: AC
  Administered 2022-07-25: 1000 ug via INTRAMUSCULAR

## 2022-07-25 NOTE — Telephone Encounter (Signed)
Pt ready for scheduling on or after 08/25/22  Out-of-pocket cost due at time of visit: $327  Primary: A[employer]na Medicare Adv PPO Prolia co-insurance: 20% (approximately $302) Admin fee co-insurance: 20% (approximately $25)  Deductible: does not apply  Prior Auth APPROVED PA# [id number]  Valid: 07/17/22-07/17/23  Secondary: N/A Prolia co-insurance:  Admin fee co-insurance:  Deductible:  Prior Auth:  PA# Valid:   ** This summary of benefits is an estimation of the patient's out-of-pocket cost. Exact cost may vary based on individual plan coverage.

## 2022-07-25 NOTE — Progress Notes (Signed)
Pls cosign for B12 inj../lmb  

## 2022-07-25 NOTE — Telephone Encounter (Signed)
Prior Auth APPROVED PA# [id number]  Valid: 07/17/22-07/17/23

## 2022-08-23 ENCOUNTER — Ambulatory Visit (INDEPENDENT_AMBULATORY_CARE_PROVIDER_SITE_OTHER): Payer: Medicare HMO

## 2022-08-23 DIAGNOSIS — E538 Deficiency of other specified B group vitamins: Secondary | ICD-10-CM | POA: Diagnosis not present

## 2022-08-23 MED ORDER — CYANOCOBALAMIN 1000 MCG/ML IJ SOLN
1000.0000 ug | Freq: Once | INTRAMUSCULAR | Status: AC
  Administered 2022-08-23: 1000 ug via INTRAMUSCULAR

## 2022-08-23 NOTE — Progress Notes (Signed)
Pt here for monthly B12 injection per   B12 1000mcg given IM and pt tolerated injection well.   

## 2022-08-27 ENCOUNTER — Ambulatory Visit: Payer: Medicare HMO

## 2022-08-27 ENCOUNTER — Encounter (INDEPENDENT_AMBULATORY_CARE_PROVIDER_SITE_OTHER): Payer: Medicare HMO

## 2022-08-27 VITALS — BP 120/80 | HR 78 | Ht 62.0 in | Wt 132.2 lb

## 2022-08-27 DIAGNOSIS — M810 Age-related osteoporosis without current pathological fracture: Secondary | ICD-10-CM | POA: Diagnosis not present

## 2022-08-27 MED ORDER — DENOSUMAB 60 MG/ML ~~LOC~~ SOSY
60.0000 mg | PREFILLED_SYRINGE | Freq: Once | SUBCUTANEOUS | Status: AC
  Administered 2022-08-27: 60 mg via SUBCUTANEOUS

## 2022-08-27 NOTE — Progress Notes (Deleted)
After obtaining consent, and per orders of Dr. Gherghe, injection of Prolia   given by Yehoshua Vitelli A Ramondo Dietze. Patient instructed to remain in clinic for 20 minutes afterwards, and to report any adverse reaction to me immediately.  

## 2022-08-27 NOTE — Progress Notes (Signed)
After obtaining consent, and per orders of Dr. Gherghe, injection of Prolia   given by Chyrel Taha A Ireland Virrueta. Patient instructed to remain in clinic for 20 minutes afterwards, and to report any adverse reaction to me immediately.  

## 2022-08-27 NOTE — Progress Notes (Unsigned)
After obtaining consent, and per orders of Dr. Gherghe, injection of Prolia   given by Erik Burkett A [name]. Patient instructed to remain in clinic for 20 minutes afterwards, and to report any adverse reaction to me immediately.  

## 2022-09-02 NOTE — Telephone Encounter (Signed)
Last Prolia inj 08/27/22 Next Prolia inj due 02/28/23 

## 2022-09-05 ENCOUNTER — Ambulatory Visit (INDEPENDENT_AMBULATORY_CARE_PROVIDER_SITE_OTHER): Payer: Medicare HMO | Admitting: Nurse Practitioner

## 2022-09-05 VITALS — BP 100/68 | HR 87 | Temp 98.0°F | Ht 62.0 in | Wt 132.1 lb

## 2022-09-05 DIAGNOSIS — R21 Rash and other nonspecific skin eruption: Secondary | ICD-10-CM | POA: Diagnosis not present

## 2022-09-05 MED ORDER — PREDNISONE 20 MG PO TABS: 40.0000 mg | ORAL_TABLET | Freq: Every day | ORAL | 0 refills | Status: DC

## 2022-09-05 NOTE — Assessment & Plan Note (Signed)
Acute Appears consistent with a contact dermatitis of some sort.  Initially thought possibly shingles, but seems unlikely that she would have a shingles rash in 2 separate locations on each side of the body.  Due to lesions being on neck and face, I will treat with short course of oral prednisone (40 mg daily with a meal x 5 days).  We discussed possible side effects and risks associated with prednisone and what to do if these were to occur.  Patient reports understanding.  Patient told to call the office if rash worsens, or does not improve over the weekend.  She reports understanding.

## 2022-09-05 NOTE — Progress Notes (Signed)
Established Patient Office Visit  Subjective   Patient ID: Bethany Carter, female    DOB: 12-Mar-1950  Age: 72 y.o. MRN: [id number]  Chief Complaint  Patient presents with   Poison Ivy    Possible poison ivy, the on the left arm, right side of the face, and between the left fingers, it happened on Tuesday, itchy and burning sensation     Rash: Located to left forearm.  Initially erupted on Tuesday.  Lesions have now erupted on right side of neck and cheek.  They are itchy.  They also feel "prickly and burning".  No drainage noted.  Patient denies working in the yard or being in the woods recently.  She does have a dog, but reports that she keeps the dog on a leash when she brings them outside.  No new exposures to soaps, laundry detergent, lotions, etc.  Has been using topical Benadryl.    Review of Systems  Constitutional:  Negative for chills and fever.  Respiratory:  Negative for cough and shortness of breath.   Cardiovascular:  Negative for chest pain.  Skin:  Positive for itching and rash.      Objective:     BP 100/68   Pulse 87   Temp 98 F (36.7 C) (Temporal)   Ht 5\' 2"  (1.575 m)   Wt 132 lb 2 oz (59.9 kg)   SpO2 96%   BMI 24.17 kg/m    Physical Exam Vitals reviewed.  Constitutional:      General: She is not in acute distress.    Appearance: Normal appearance.  HENT:     Head: Normocephalic and atraumatic.  Neck:     Vascular: No carotid bruit.  Cardiovascular:     Rate and Rhythm: Normal rate and regular rhythm.     Pulses: Normal pulses.     Heart sounds: Normal heart sounds.  Pulmonary:     Effort: Pulmonary effort is normal.     Breath sounds: Normal breath sounds.  Skin:    General: Skin is warm and dry.       Neurological:     General: No focal deficit present.     Mental Status: She is alert and oriented to person, place, and time.  Psychiatric:        Mood and Affect: Mood normal.        Behavior: Behavior normal.        Judgment:  Judgment normal.      No results found for any visits on 09/05/22.    The 10-year ASCVD risk score (Arnett DK, et al., 2019) is: 9.1%    Assessment & Plan:   Problem List Items Addressed This Visit       Musculoskeletal and Integument   Rash - Primary    Acute Appears consistent with a contact dermatitis of some sort.  Initially thought possibly shingles, but seems unlikely that she would have a shingles rash in 2 separate locations on each side of the body.  Due to lesions being on neck and face, I will treat with short course of oral prednisone (40 mg daily with a meal x 5 days).  We discussed possible side effects and risks associated with prednisone and what to do if these were to occur.  Patient reports understanding.  Patient told to call the office if rash worsens, or does not improve over the weekend.  She reports understanding.      Relevant Medications   predniSONE (DELTASONE) 20 MG  tablet    Return if symptoms worsen or fail to improve.    Elenore Paddy, NP

## 2022-09-19 ENCOUNTER — Ambulatory Visit (INDEPENDENT_AMBULATORY_CARE_PROVIDER_SITE_OTHER): Payer: Medicare HMO | Admitting: Radiology

## 2022-09-19 DIAGNOSIS — E538 Deficiency of other specified B group vitamins: Secondary | ICD-10-CM

## 2022-09-19 MED ORDER — CYANOCOBALAMIN 1000 MCG/ML IJ SOLN
1000.0000 ug | Freq: Once | INTRAMUSCULAR | Status: AC
Start: 2022-09-19 — End: 2022-09-19
  Administered 2022-09-19: 1000 ug via INTRAMUSCULAR

## 2022-09-19 NOTE — Progress Notes (Signed)
Pt here for monthly B12 injection   B12 1094mcg given IM, and pt tolerated injection well.

## 2022-09-25 ENCOUNTER — Encounter (INDEPENDENT_AMBULATORY_CARE_PROVIDER_SITE_OTHER): Payer: Self-pay

## 2022-10-24 ENCOUNTER — Ambulatory Visit (INDEPENDENT_AMBULATORY_CARE_PROVIDER_SITE_OTHER): Payer: Medicare HMO | Admitting: *Deleted

## 2022-10-24 DIAGNOSIS — E538 Deficiency of other specified B group vitamins: Secondary | ICD-10-CM | POA: Diagnosis not present

## 2022-10-24 MED ORDER — CYANOCOBALAMIN 1000 MCG/ML IJ SOLN
1000.0000 ug | Freq: Once | INTRAMUSCULAR | Status: AC
Start: 2022-10-24 — End: 2022-10-24
  Administered 2022-10-24: 1000 ug via INTRAMUSCULAR

## 2022-10-24 NOTE — Progress Notes (Signed)
Patient here for her b12 injection. Given in her left deltoid. Patient tolerated well.

## 2022-11-25 ENCOUNTER — Ambulatory Visit (INDEPENDENT_AMBULATORY_CARE_PROVIDER_SITE_OTHER): Payer: Medicare HMO

## 2022-11-25 DIAGNOSIS — Z23 Encounter for immunization: Secondary | ICD-10-CM

## 2022-11-25 DIAGNOSIS — E538 Deficiency of other specified B group vitamins: Secondary | ICD-10-CM | POA: Diagnosis not present

## 2022-11-25 MED ORDER — CYANOCOBALAMIN 1000 MCG/ML IJ SOLN
1000.0000 ug | Freq: Once | INTRAMUSCULAR | Status: AC
Start: 2022-11-25 — End: 2022-11-25
  Administered 2022-11-25: 1000 ug via INTRAMUSCULAR

## 2022-11-25 NOTE — Progress Notes (Signed)
After obtaining consent, and per orders of Dr. Okey Dupre, injection of B12 and HDF shot was given by Ferdie Ping. Patient instructed to report any adverse reaction to me immediately.

## 2022-12-23 ENCOUNTER — Ambulatory Visit (INDEPENDENT_AMBULATORY_CARE_PROVIDER_SITE_OTHER): Payer: Medicare HMO | Admitting: *Deleted

## 2022-12-23 DIAGNOSIS — E538 Deficiency of other specified B group vitamins: Secondary | ICD-10-CM | POA: Diagnosis not present

## 2022-12-23 MED ORDER — CYANOCOBALAMIN 1000 MCG/ML IJ SOLN
1000.0000 ug | Freq: Once | INTRAMUSCULAR | Status: AC
Start: 2022-12-23 — End: 2022-12-23
  Administered 2022-12-23: 1000 ug via INTRAMUSCULAR

## 2022-12-23 NOTE — Progress Notes (Signed)
Patient visits today for their B-12 injection. PT was informed of what medication they would be receiving and tolerated the injection well. PT was notified to reach out to the office if needed.

## 2023-01-20 ENCOUNTER — Ambulatory Visit: Payer: Medicare HMO

## 2023-01-22 ENCOUNTER — Ambulatory Visit: Payer: Medicare HMO

## 2023-01-27 ENCOUNTER — Ambulatory Visit (INDEPENDENT_AMBULATORY_CARE_PROVIDER_SITE_OTHER): Payer: Medicare HMO

## 2023-01-27 DIAGNOSIS — E538 Deficiency of other specified B group vitamins: Secondary | ICD-10-CM

## 2023-01-27 MED ORDER — CYANOCOBALAMIN 1000 MCG/ML IJ SOLN
1000.0000 ug | Freq: Once | INTRAMUSCULAR | Status: AC
Start: 2023-01-27 — End: 2023-01-27
  Administered 2023-01-27: 1000 ug via INTRAMUSCULAR

## 2023-01-27 NOTE — Progress Notes (Signed)
Pt was given B12 injection with no complications.

## 2023-02-11 NOTE — Telephone Encounter (Signed)
 Prolia VOB initiated via [email or web address]  Next Prolia inj DUE: 02/28/23

## 2023-02-26 NOTE — Telephone Encounter (Signed)
 VOB pending

## 2023-02-28 ENCOUNTER — Ambulatory Visit: Payer: Medicare HMO

## 2023-02-28 DIAGNOSIS — M810 Age-related osteoporosis without current pathological fracture: Secondary | ICD-10-CM

## 2023-02-28 MED ORDER — DENOSUMAB 60 MG/ML ~~LOC~~ SOSY
60.0000 mg | PREFILLED_SYRINGE | Freq: Once | SUBCUTANEOUS | Status: AC
Start: 2023-08-28 — End: ?

## 2023-02-28 MED ORDER — DENOSUMAB 60 MG/ML ~~LOC~~ SOSY
60.0000 mg | PREFILLED_SYRINGE | Freq: Once | SUBCUTANEOUS | Status: AC
Start: 2023-02-28 — End: 2023-02-28
  Administered 2023-02-28: 60 mg via SUBCUTANEOUS

## 2023-02-28 NOTE — Progress Notes (Signed)
 After obtaining consent, and per orders of Dr. Elvera Lennox, injection of Prolia 60 mg given by Pollie Meyer. Patient instructed to remain in clinic for 20 minutes afterwards, and to report any adverse reaction to me immediately.

## 2023-03-02 NOTE — Telephone Encounter (Signed)
 Prior Authorization REQUIRED for PROLIA  PA PROCESS DETAILS: PA is required. Call [phone] or complete the PA form available at [email or web address] Fax completed form to [phone]. Or submit PA request online at [email or web address]

## 2023-03-02 NOTE — Telephone Encounter (Signed)
Prior Auth APPROVED PA# [id number]  Valid: 07/17/22-07/17/23

## 2023-03-03 ENCOUNTER — Ambulatory Visit: Payer: Medicare HMO

## 2023-03-05 ENCOUNTER — Ambulatory Visit (INDEPENDENT_AMBULATORY_CARE_PROVIDER_SITE_OTHER): Payer: Medicare HMO

## 2023-03-05 DIAGNOSIS — E538 Deficiency of other specified B group vitamins: Secondary | ICD-10-CM | POA: Diagnosis not present

## 2023-03-05 MED ORDER — CYANOCOBALAMIN 1000 MCG/ML IJ SOLN
1000.0000 ug | Freq: Once | INTRAMUSCULAR | Status: AC
Start: 2023-03-05 — End: 2023-03-05
  Administered 2023-03-05: 1000 ug via INTRAMUSCULAR

## 2023-03-05 NOTE — Progress Notes (Signed)
 Patient visits today to receive her B12 injection. Patient was informed and tolerated well. Patient was notified to reach out to [place] if needed.

## 2023-03-29 NOTE — Telephone Encounter (Signed)
Patient is ready for scheduling on or after 02/28/23 BUY AND BILL  Out-of-pocket cost due at time of visit: $356.87  Primary: [employer] Medicare Adv PPO Prolia co-insurance: 20% (approximately $331.87) Admin fee co-insurance: 20% (approximately $25)  Deductible: does not apply  Prior Auth: APPROVED PA# [id number]  Valid: 07/17/22-07/17/23  Secondary: N/A Prolia co-insurance:  Admin fee co-insurance:  Deductible:  Prior Auth:  PA# Valid:   ** This summary of benefits is an estimation of the patient's out-of-pocket cost. Exact cost may vary based on individual plan coverage.

## 2023-03-29 NOTE — Telephone Encounter (Signed)
Last Prolia inj 02/28/23 Next Prolia inj due 08/29/23

## 2023-03-31 ENCOUNTER — Ambulatory Visit: Payer: Medicare HMO | Admitting: Internal Medicine

## 2023-04-02 ENCOUNTER — Ambulatory Visit (INDEPENDENT_AMBULATORY_CARE_PROVIDER_SITE_OTHER): Payer: Medicare HMO | Admitting: Internal Medicine

## 2023-04-02 ENCOUNTER — Encounter: Payer: Self-pay | Admitting: Internal Medicine

## 2023-04-02 VITALS — BP 118/64 | HR 70 | Ht 62.0 in | Wt 130.8 lb

## 2023-04-02 DIAGNOSIS — E559 Vitamin D deficiency, unspecified: Secondary | ICD-10-CM | POA: Diagnosis not present

## 2023-04-02 DIAGNOSIS — E210 Primary hyperparathyroidism: Secondary | ICD-10-CM | POA: Diagnosis not present

## 2023-04-02 DIAGNOSIS — M810 Age-related osteoporosis without current pathological fracture: Secondary | ICD-10-CM
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
## 2023-04-02 NOTE — Patient Instructions (Addendum)
 Please stop at the lab.  Continue: - Prolia  - vitamin D  2000 units daily.  Let's get a new DXA scan in 06/2023.  Please come back for a follow-up appointment in 1 year.

## 2023-04-03 ENCOUNTER — Encounter: Payer: Self-pay | Admitting: Internal Medicine

## 2023-04-03 LAB — BASIC METABOLIC PANEL WITH GFR
BUN: 18 mg/dL (ref 7–25)
CO2: 29 mmol/L (ref 20–32)
Calcium: 9.4 mg/dL (ref 8.6–10.4)
Chloride: 105 mmol/L (ref 98–110)
Creat: 0.64 mg/dL (ref 0.60–1.00)
Glucose, Bld: 82 mg/dL (ref 65–99)
Potassium: 4.2 mmol/L (ref 3.5–5.3)
Sodium: 141 mmol/L (ref 135–146)
eGFR: 94 mL/min/{1.73_m2} (ref >=60)

## 2023-04-03 LAB — VITAMIN D 25 HYDROXY (VIT D DEFICIENCY, FRACTURES): Vit D, 25-Hydroxy: 41 ng/mL (ref 30–100)

## 2023-04-07 ENCOUNTER — Ambulatory Visit (INDEPENDENT_AMBULATORY_CARE_PROVIDER_SITE_OTHER): Payer: Medicare HMO

## 2023-04-07 DIAGNOSIS — E538 Deficiency of other specified B group vitamins: Secondary | ICD-10-CM

## 2023-04-07 MED ORDER — CYANOCOBALAMIN 1000 MCG/ML IJ SOLN
1000.0000 ug | Freq: Once | INTRAMUSCULAR | Status: AC
  Administered 2023-04-07: 1000 ug via INTRAMUSCULAR

## 2023-04-07 NOTE — Progress Notes (Signed)
After obtaining consent, and per orders of Dr. Crawford, injection of B12 was given by Kamla Skilton P [facility]. Patient instructed to report any adverse reaction to me immediately.  

## 2023-05-07 ENCOUNTER — Ambulatory Visit (INDEPENDENT_AMBULATORY_CARE_PROVIDER_SITE_OTHER): Payer: Medicare HMO

## 2023-05-07 DIAGNOSIS — E538 Deficiency of other specified B group vitamins: Secondary | ICD-10-CM | POA: Diagnosis not present

## 2023-05-07 MED ORDER — CYANOCOBALAMIN 1000 MCG/ML IJ SOLN
1000.0000 ug | Freq: Once | INTRAMUSCULAR | Status: AC
  Administered 2023-05-07: 1000 ug via INTRAMUSCULAR

## 2023-05-07 NOTE — Progress Notes (Signed)
 Patient visits today for their b-12 injection. Patient informed of what they had received and tolerated injection well. Patient notified to reach out to office if needed.

## 2023-05-13 ENCOUNTER — Telehealth: Payer: Self-pay

## 2023-05-13 NOTE — Telephone Encounter (Signed)
 Attempted to call patient and left detailed voice message for them to call back and schedule for their annual visit with Dr.Crawford.

## 2023-05-23 ENCOUNTER — Other Ambulatory Visit: Payer: Self-pay | Admitting: Internal Medicine

## 2023-06-02 ENCOUNTER — Other Ambulatory Visit: Payer: Self-pay | Admitting: Obstetrics

## 2023-06-02 DIAGNOSIS — Z1231 Encounter for screening mammogram for malignant neoplasm of breast: Secondary | ICD-10-CM

## 2023-06-04 ENCOUNTER — Ambulatory Visit: Payer: Medicare HMO | Admitting: Internal Medicine

## 2023-06-09 ENCOUNTER — Ambulatory Visit

## 2023-06-11 ENCOUNTER — Ambulatory Visit (INDEPENDENT_AMBULATORY_CARE_PROVIDER_SITE_OTHER)

## 2023-06-11 DIAGNOSIS — E538 Deficiency of other specified B group vitamins: Secondary | ICD-10-CM

## 2023-06-11 MED ORDER — CYANOCOBALAMIN 1000 MCG/ML IJ SOLN
1000.0000 ug | Freq: Once | INTRAMUSCULAR | Status: AC
Start: 2023-06-11 — End: 2023-06-11
  Administered 2023-06-11: 1000 ug via INTRAMUSCULAR

## 2023-06-11 NOTE — Progress Notes (Signed)
 Patient visits today for their b-12 injection. Patient informed on what they had received and tolerated injection well. Patient notified to reach out to the office if needed.

## 2023-06-18 ENCOUNTER — Ambulatory Visit

## 2023-06-18 ENCOUNTER — Ambulatory Visit (INDEPENDENT_AMBULATORY_CARE_PROVIDER_SITE_OTHER)

## 2023-06-18 VITALS — BP 108/68 | HR 68 | Ht 63.0 in | Wt 131.8 lb

## 2023-06-18 DIAGNOSIS — Z0000 Encounter for general adult medical examination without abnormal findings: Secondary | ICD-10-CM | POA: Diagnosis not present
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
## 2023-06-18 NOTE — Patient Instructions (Addendum)
 Ms. Majette , Thank you for taking time to come for your Medicare Wellness Visit. I appreciate your ongoing commitment to your health goals. Please review the following plan we discussed and let me know if I can assist you in the future.   Referrals/Orders/Follow-Ups/Clinician Recommendations: Aim for 30 minutes of exercise or brisk walking, 6-8 glasses of water, and 5 servings of fruits and vegetables each day. Mammogram and DEXA Scan are scheduled for 2025.  This is a list of the screening recommended for you and due dates:  Health Maintenance  Topic Date Due   COVID-19 Vaccine (4 - 2024-25 season) 10/27/2022   DTaP/Tdap/Td vaccine (3 - Td or Tdap) 11/18/2022   Flu Shot  09/26/2023   Medicare Annual Wellness Visit  06/17/2024   Mammogram  07/14/2024   Colon Cancer Screening  07/21/2025   Pneumonia Vaccine  Completed   DEXA scan (bone density measurement)  Completed   Hepatitis C Screening  Completed   Zoster (Shingles) Vaccine  Completed   HPV Vaccine  Aged Out   Meningitis B Vaccine  Aged Out    Advanced directives: (Copy Requested) Please bring a copy of your health care power of attorney and living will to the office to be added to your chart at your convenience. You can mail to [facility] [address] [place], [place] [address] or email to [email or web address]  Next Medicare Annual Wellness Visit scheduled for next year: Yes

## 2023-07-01 ENCOUNTER — Ambulatory Visit: Admitting: Internal Medicine

## 2023-07-01 ENCOUNTER — Encounter: Payer: Self-pay | Admitting: Internal Medicine

## 2023-07-01 VITALS — BP 112/80 | HR 87 | Temp 97.8°F | Ht 63.0 in | Wt 130.0 lb

## 2023-07-01 DIAGNOSIS — R739 Hyperglycemia, unspecified: Secondary | ICD-10-CM

## 2023-07-01 DIAGNOSIS — I5032 Chronic diastolic (congestive) heart failure: Secondary | ICD-10-CM

## 2023-07-01 DIAGNOSIS — Z0000 Encounter for general adult medical examination without abnormal findings: Secondary | ICD-10-CM | POA: Diagnosis not present

## 2023-07-01 DIAGNOSIS — E538 Deficiency of other specified B group vitamins: Secondary | ICD-10-CM | POA: Diagnosis not present

## 2023-07-01 DIAGNOSIS — D509 Iron deficiency anemia, unspecified: Secondary | ICD-10-CM | POA: Diagnosis not present

## 2023-07-01 LAB — COMPREHENSIVE METABOLIC PANEL WITH GFR
ALT: 18 U/L (ref 0–35)
AST: 22 U/L (ref 0–37)
Albumin: 4.4 g/dL (ref 3.5–5.2)
Alkaline Phosphatase: 56 U/L (ref 39–117)
BUN: 19 mg/dL (ref 6–23)
CO2: 30 meq/L (ref 19–32)
Calcium: 9 mg/dL (ref 8.4–10.5)
Chloride: 100 meq/L (ref 96–112)
Creatinine, Ser: 0.81 mg/dL (ref 0.40–1.20)
GFR: 72.28 mL/min (ref >60.00)
Glucose, Bld: 93 mg/dL (ref 70–99)
Potassium: 3.8 meq/L (ref 3.5–5.1)
Sodium: 137 meq/L (ref 135–145)
Total Bilirubin: 0.4 mg/dL (ref 0.2–1.2)
Total Protein: 6.9 g/dL (ref 6.0–8.3)

## 2023-07-01 LAB — LIPID PANEL
Cholesterol: 173 mg/dL (ref 0–200)
HDL: 70.7 mg/dL (ref >39.00)
LDL Cholesterol: 82 mg/dL (ref 0–99)
NonHDL: 102.28
Total CHOL/HDL Ratio: 2
Triglycerides: 99 mg/dL (ref 0.0–149.0)
VLDL: 19.8 mg/dL (ref 0.0–40.0)

## 2023-07-01 LAB — CBC
HCT: 42 % (ref 36.0–46.0)
Hemoglobin: 13.9 g/dL (ref 12.0–15.0)
MCHC: 33.2 g/dL (ref 30.0–36.0)
MCV: 89.4 fl (ref 78.0–100.0)
Platelets: 239 10*3/uL (ref 150.0–400.0)
RBC: 4.7 Mil/uL (ref 3.87–5.11)
RDW: 13.3 % (ref 11.5–15.5)
WBC: 6.7 10*3/uL (ref 4.0–10.5)

## 2023-07-01 LAB — FERRITIN: Ferritin: 15.3 ng/mL (ref 10.0–291.0)

## 2023-07-01 LAB — HEMOGLOBIN A1C: Hgb A1c MFr Bld: 5.6 % (ref 4.6–6.5)

## 2023-07-01 LAB — TSH: TSH: 1.02 u[IU]/mL (ref 0.35–5.50)

## 2023-07-01 MED ORDER — CYANOCOBALAMIN 1000 MCG/ML IJ SOLN
1000.0000 ug | Freq: Once | INTRAMUSCULAR | Status: AC
  Administered 2023-07-01: 1000 ug via INTRAMUSCULAR

## 2023-07-01 NOTE — Assessment & Plan Note (Signed)
 Checking CBC and ferritin today.

## 2023-07-01 NOTE — Assessment & Plan Note (Signed)
 Checking labs today as she is having more fatigue.

## 2023-07-01 NOTE — Progress Notes (Signed)
   Subjective:   Patient ID: Bethany Carter, female    DOB: October 17, 1950, 73 y.o.   MRN: [id number]  HPI The patient is here for physical.  PMH, [facility], social history reviewed and updated  Review of Systems  Constitutional: Negative.   HENT: Negative.    Eyes: Negative.   Respiratory:  Negative for cough, chest tightness and shortness of breath.   Cardiovascular:  Negative for chest pain, palpitations and leg swelling.  Gastrointestinal:  Negative for abdominal distention, abdominal pain, constipation, diarrhea, nausea and vomiting.  Musculoskeletal:  Positive for back pain.  Skin: Negative.   Neurological: Negative.   Psychiatric/Behavioral: Negative.      Objective:  Physical Exam Constitutional:      Appearance: She is well-developed.  HENT:     Head: Normocephalic and atraumatic.  Cardiovascular:     Rate and Rhythm: Normal rate and regular rhythm.  Pulmonary:     Effort: Pulmonary effort is normal. No respiratory distress.     Breath sounds: Normal breath sounds. No wheezing or rales.  Abdominal:     General: Bowel sounds are normal. There is no distension.     Palpations: Abdomen is soft.     Tenderness: There is no abdominal tenderness. There is no rebound.  Musculoskeletal:        General: Tenderness present.     Cervical back: Normal range of motion.  Skin:    General: Skin is warm and dry.  Neurological:     Mental Status: She is alert and oriented to person, place, and time.     Coordination: Coordination normal.     Vitals:   07/01/23 1505  BP: 112/80  Pulse: 87  Temp: 97.8 F (36.6 C)  TempSrc: Oral  SpO2: 96%  Weight: 130 lb (59 kg)  Height: 5\' 3"  (1.6 m)    Assessment & Plan:  B12 given at visit

## 2023-07-01 NOTE — Assessment & Plan Note (Signed)
Flu shot yearly. Pneumonia complete. Shingrix complete. Tetanus due at pharmacy. Colonoscopy up to date. Mammogram up to date, pap smear aged out and dexa up to date. Counseled about sun safety and mole surveillance. Counseled about the dangers of distracted driving. Given 10 year screening recommendations.

## 2023-07-01 NOTE — Assessment & Plan Note (Signed)
 Given B12 at visit today and will continue monthly.

## 2023-07-01 NOTE — Patient Instructions (Signed)
 Tetanus booster and RSV vaccine you are eligible for.

## 2023-07-07 ENCOUNTER — Encounter: Payer: Self-pay | Admitting: Internal Medicine

## 2023-07-09 ENCOUNTER — Ambulatory Visit: Payer: Self-pay

## 2023-07-11 ENCOUNTER — Ambulatory Visit

## 2023-07-14 DIAGNOSIS — L814 Other melanin hyperpigmentation: Secondary | ICD-10-CM | POA: Diagnosis not present

## 2023-07-14 DIAGNOSIS — D229 Melanocytic nevi, unspecified: Secondary | ICD-10-CM | POA: Diagnosis not present

## 2023-07-14 DIAGNOSIS — L570 Actinic keratosis: Secondary | ICD-10-CM | POA: Diagnosis not present

## 2023-07-14 DIAGNOSIS — L853 Xerosis cutis: Secondary | ICD-10-CM | POA: Diagnosis not present

## 2023-07-14 DIAGNOSIS — L821 Other seborrheic keratosis: Secondary | ICD-10-CM | POA: Diagnosis not present

## 2023-07-14 DIAGNOSIS — L578 Other skin changes due to chronic exposure to nonionizing radiation: Secondary | ICD-10-CM | POA: Diagnosis not present

## 2023-07-16 ENCOUNTER — Encounter: Payer: Self-pay | Admitting: Internal Medicine

## 2023-07-16 MED ORDER — FUROSEMIDE 20 MG PO TABS: 20.0000 mg | ORAL_TABLET | Freq: Every day | ORAL | 3 refills | Status: DC

## 2023-07-22 ENCOUNTER — Ambulatory Visit

## 2023-07-24 ENCOUNTER — Ambulatory Visit
Admission: RE | Admit: 2023-07-24 | Discharge: 2023-07-24 | Disposition: A | Discharge status: Home / Self Care | Source: Ambulatory Visit | Attending: Obstetrics | Admitting: Obstetrics

## 2023-07-24 DIAGNOSIS — Z1231 Encounter for screening mammogram for malignant neoplasm of breast: Secondary | ICD-10-CM | POA: Diagnosis not present

## 2023-08-01 ENCOUNTER — Ambulatory Visit

## 2023-08-01 DIAGNOSIS — E538 Deficiency of other specified B group vitamins: Secondary | ICD-10-CM | POA: Diagnosis not present

## 2023-08-01 MED ORDER — CYANOCOBALAMIN 1000 MCG/ML IJ SOLN
1000.0000 ug | Freq: Once | INTRAMUSCULAR | Status: AC
  Administered 2023-08-01: 1000 ug via INTRAMUSCULAR

## 2023-08-01 NOTE — Progress Notes (Signed)
Pt here for monthly B12 injection per Dr. Okey Dupre  B12 given IM and pt tolerated injection well.

## 2023-08-19 NOTE — Telephone Encounter (Signed)
 Prolia  VOB initiated via [email or web address]  Next Prolia  inj DUE: 08/29/23

## 2023-08-21 NOTE — Telephone Encounter (Signed)
 Medical [employer] and [employer] - Prior Authorization REQUIRED for [employer]

## 2023-08-22 NOTE — Telephone Encounter (Signed)
 Prior Authorization initiated for PROLIA  via [employer] Case ID: [id number]

## 2023-08-25 NOTE — Telephone Encounter (Signed)
 Prior Auth for PROLIA  APPROVED PA# [id number] Valid: 08/22/23-08/21/24

## 2023-08-28 ENCOUNTER — Telehealth: Payer: Self-pay | Admitting: Internal Medicine

## 2023-08-28 NOTE — Telephone Encounter (Signed)
 Patient called and wants to schedule a Prolia  injection.Her previous PA has expired and needs a new PA submitted.Her MRN is [id number]

## 2023-09-02 NOTE — Telephone Encounter (Signed)
 Prior Auth for PROLIA  APPROVED PA# [id number] Valid: 08/22/23-08/21/24

## 2023-09-02 NOTE — Telephone Encounter (Signed)
 [employer]  Patient is ready for scheduling on or after 08/29/23  Out-of-pocket cost due at time of visit: $356.87   Primary: [employer] Medicare Advantage PPO Prolia  co-insurance: 20% (approximately $331.87) Admin fee co-insurance: 20% (approximately $25)  Deductible: does not apply  Prior Auth: APPROVED PA# [id number] Valid: 08/22/23-08/21/24  Secondary: N/A Prolia  co-insurance:  Admin fee co-insurance:  Deductible:  Prior Auth:  PA# Valid:   ** This summary of benefits is an estimation of the patient's out-of-pocket cost. Exact cost may vary based on individual plan coverage.

## 2023-09-03 ENCOUNTER — Ambulatory Visit (INDEPENDENT_AMBULATORY_CARE_PROVIDER_SITE_OTHER)

## 2023-09-03 DIAGNOSIS — E538 Deficiency of other specified B group vitamins: Secondary | ICD-10-CM | POA: Diagnosis not present

## 2023-09-03 MED ORDER — CYANOCOBALAMIN 1000 MCG/ML IJ SOLN
1000.0000 ug | Freq: Once | INTRAMUSCULAR | Status: AC
  Administered 2023-09-03: 1000 ug via INTRAMUSCULAR

## 2023-09-03 NOTE — Progress Notes (Signed)
After obtaining consent, and per orders of Dr. Sharlet Salina, injection of B12 given by Marrian Salvage. Patient instructed to report any adverse reaction to me immediately.

## 2023-09-05 ENCOUNTER — Ambulatory Visit

## 2023-09-12 ENCOUNTER — Ambulatory Visit

## 2023-09-12 VITALS — BP 124/80 | HR 72 | Resp 16

## 2023-09-12 DIAGNOSIS — M810 Age-related osteoporosis without current pathological fracture: Secondary | ICD-10-CM | POA: Diagnosis not present

## 2023-09-12 MED ORDER — DENOSUMAB 60 MG/ML ~~LOC~~ SOSY: 60.0000 mg | PREFILLED_SYRINGE | Freq: Once | SUBCUTANEOUS | Status: AC

## 2023-09-12 MED ORDER — DENOSUMAB 60 MG/ML ~~LOC~~ SOSY
60.0000 mg | PREFILLED_SYRINGE | Freq: Once | SUBCUTANEOUS | Status: AC
  Administered 2023-09-12: 60 mg via SUBCUTANEOUS

## 2023-09-12 NOTE — Progress Notes (Signed)
 After obtaining consent, and per orders of Dr. Elvera Lennox, injection of Prolia given by Tera Partridge. Patient instructed to remain in clinic for 20 minutes afterwards, and to report any adverse reaction to me immediately.

## 2023-09-15 NOTE — Telephone Encounter (Signed)
 Last Prolia  inj 09/12/23 Next Prolia  inj due 03/15/24

## 2023-09-17 DIAGNOSIS — L820 Inflamed seborrheic keratosis: Secondary | ICD-10-CM | POA: Diagnosis not present

## 2023-09-17 DIAGNOSIS — L710 Perioral dermatitis: Secondary | ICD-10-CM | POA: Diagnosis not present

## 2023-09-17 DIAGNOSIS — L570 Actinic keratosis: Secondary | ICD-10-CM | POA: Diagnosis not present

## 2023-09-29 ENCOUNTER — Encounter: Payer: Self-pay | Admitting: Internal Medicine

## 2023-09-29 ENCOUNTER — Ambulatory Visit: Attending: Internal Medicine | Admitting: Internal Medicine

## 2023-09-29 VITALS — BP 112/68 | HR 76 | Ht 62.5 in | Wt 132.0 lb

## 2023-09-29 DIAGNOSIS — E785 Hyperlipidemia, unspecified: Secondary | ICD-10-CM | POA: Diagnosis not present

## 2023-09-29 DIAGNOSIS — Z789 Other specified health status: Secondary | ICD-10-CM | POA: Diagnosis not present

## 2023-09-29 DIAGNOSIS — Z79899 Other long term (current) drug therapy: Secondary | ICD-10-CM

## 2023-09-29 DIAGNOSIS — R0609 Other forms of dyspnea: Secondary | ICD-10-CM | POA: Diagnosis not present

## 2023-09-29 DIAGNOSIS — R600 Localized edema: Secondary | ICD-10-CM | POA: Diagnosis not present

## 2023-09-29 MED ORDER — FUROSEMIDE 20 MG PO TABS: 20.0000 mg | ORAL_TABLET | Freq: Every day | ORAL | 3 refills | Status: AC

## 2023-09-29 MED ORDER — EZETIMIBE 10 MG PO TABS: 10.0000 mg | ORAL_TABLET | Freq: Every day | ORAL | 3 refills | Status: AC

## 2023-09-29 NOTE — Progress Notes (Signed)
 Cardiology Office Note:  .   Date:  09/29/2023  ID:  Bethany Carter, DOB May 02, 1950, MRN [id number] PCP: Rollene Almarie LABOR, MD  [facility] Providers Cardiologist:  Soyla LABOR Merck, MD    History of Present Illness: Bethany Carter is a 73 y.o. female.  Discussed the use of AI scribe software for clinical note transcription with the patient, who gave verbal consent to proceed.  History of Present Illness Bethany Carter is a 73 year old female with no coronary artery disease who presents with dyspnea on exertion.  She experiences shortness of breath when climbing stairs, especially at work where she ascends four flights multiple times a day. The dyspnea worsens when carrying her dog or other items, requiring her to pause with one flight remaining. She recovers within a couple of minutes. There is no chest discomfort, dizziness, or lightheadedness. The dyspnea is less severe when not carrying anything.  Her history includes nonobstructive coronary artery disease diagnosed in 2015 with normal ejection fraction on echocardiogram and normal coronary arteries on coronary CTA. She is intolerant to long-acting nitrates due to headaches. She is on Zetia  10 mg daily for hyperlipidemia. She is statin intolerant and remains off Crestor .  She experiences lower extremity edema, which is well controlled with furosemide  20 mg daily. She frequently feels tired and has a low normal ferritin level. She does not take an iron supplement and follows a diet low in red meat and protein, occasionally consuming chicken and yogurt.  Her family history includes heart disease in her mother and diabetes in her father, who developed the condition around her age. Her hemoglobin A1c is 5.6%. She does not snore or have restless leg syndrome, but her back pain can disrupt her sleep. She lives alone and is unsure if she snores.    ROS: negative except per HPI above.  Studies Reviewed: SABRA    EKG Interpretation Date/Time:  Monday September 29 2023 09:04:31 EDT Ventricular Rate:  76 PR Interval:  140 QRS Duration:  76 QT Interval:  394 QTC Calculation: 443 R Axis:   -18  Text Interpretation: Normal sinus rhythm Normal ECG Confirmed by Merck Soyla ([id number]) on 09/29/2023 9:07:30 AM    Results LABS Ferritin: Low normal (06/2023) HbA1c: 5.6 (06/2023) LDL: 82 (06/2023) HDL: 70 (06/2023) Triglycerides: 99 (06/2023)  RADIOLOGY Coronary CTA: Normal coronary arteries  DIAGNOSTIC Cardiac catheterization: Nonobstructive coronary artery disease (2015) Echocardiogram: Normal ejection fraction, normal systolic function, normal diastolic function, normal right heart, valves and pressures normal (2021) EKG: Normal (09/29/2023) Risk Assessment/Calculations:       Physical Exam:   VS:  BP 112/68   Pulse 76   Ht 5' 2.5 (1.588 m)   Wt 132 lb (59.9 kg)   SpO2 99%   BMI 23.76 kg/m    Wt Readings from Last 3 Encounters:  09/29/23 132 lb (59.9 kg)  07/01/23 130 lb (59 kg)  06/18/23 131 lb 12.8 oz (59.8 kg)     Physical Exam MEASUREMENTS: Height- 5'2.5, Weight- 132. GENERAL: Alert, cooperative, well developed, no acute distress. HEENT: Normocephalic, normal oropharynx, moist mucous membranes. CHEST: Clear to auscultation bilaterally, no wheezes, rhonchi, or crackles. CARDIOVASCULAR: Normal heart rate and rhythm, S1 and S2 normal without murmurs. ABDOMEN: Soft, non-tender, non-distended, without organomegaly, normal bowel sounds. EXTREMITIES: No cyanosis or edema. NEUROLOGICAL: Cranial nerves grossly intact, moves all extremities without gross motor or sensory deficit.   ASSESSMENT AND PLAN: .    Assessment and Plan Assessment &  Plan Shortness of breath on exertion New dyspnea on exertion, likely due to deconditioning or possible iron deficiency with low normal ferritin 15. Recent cardiac evaluations normal, reducing likelihood of significant cardiac issue. - Order  echocardiogram to update cardiac status. - Encourage dietary modifications to increase iron intake. - Consider iron supplementation if dietary changes are insufficient.  Hyperlipidemia with statin intolerance Hyperlipidemia managed with Zetia , well-controlled lipid profile, low risk vascular profile. - Continue Zetia  10 mg daily for hyperlipidemia.  Lower extremity edema, well controlled Edema controlled with furosemide . - Continue furosemide  20 mg daily.  Possible iron deficiency without anemia Low normal ferritin, normal hemoglobin, possible iron deficiency contributing to fatigue and dyspnea. Low dietary iron intake. - Encourage dietary modifications to increase iron intake, focusing on iron-rich foods. - Consider over-the-counter iron supplementation if dietary changes are insufficient, with caution regarding potential constipation.    Refills provided today for 1 year, lasix  20 mg daily and zetia  10 mg daily.     Soyla Merck, MD, FACC

## 2023-09-29 NOTE — Patient Instructions (Signed)
 Medication Instructions:  No changes *If you need a refill on your cardiac medications before your next appointment, please call your pharmacy*  Lab Work: None  Testing/Procedures: Your physician has requested that you have an echocardiogram. Echocardiography is a painless test that uses sound waves to create images of your heart. It provides your doctor with information about the size and shape of your heart and how well your heart's chambers and valves are working. This procedure takes approximately one hour. There are no restrictions for this procedure. Please do NOT wear cologne, perfume, aftershave, or lotions (deodorant is allowed). Please arrive 15 minutes prior to your appointment time.  Please note: We ask at that you not bring children with you during ultrasound (echo/ vascular) testing. Due to room size and safety concerns, children are not allowed in the ultrasound rooms during exams. Our front office staff cannot provide observation of children in our lobby area while testing is being conducted. An adult accompanying a patient to their appointment will only be allowed in the ultrasound room at the discretion of the ultrasound technician under special circumstances. We apologize for any inconvenience.   Follow-Up: At [facility], you and your health needs are our priority.  As part of our continuing mission to provide you with exceptional heart care, our providers are all part of one team.  This team includes your primary Cardiologist (physician) and Advanced Practice Providers or APPs (Physician Assistants and Nurse Practitioners) who all work together to provide you with the care you need, when you need it.  Your next appointment:   1 year(s)  Provider:   Gayatri A Acharya, MD   Other Instructions Please call us  or send a MyChart message with any Cardiology related questions/concerns.  [phone].  Thank you!

## 2023-10-06 ENCOUNTER — Ambulatory Visit

## 2023-10-08 ENCOUNTER — Ambulatory Visit (INDEPENDENT_AMBULATORY_CARE_PROVIDER_SITE_OTHER)

## 2023-10-08 DIAGNOSIS — E538 Deficiency of other specified B group vitamins: Secondary | ICD-10-CM

## 2023-10-08 MED ORDER — CYANOCOBALAMIN 1000 MCG/ML IJ SOLN
1000.0000 ug | Freq: Once | INTRAMUSCULAR | Status: AC
  Administered 2023-10-08 (×2): 1000 ug via INTRAMUSCULAR

## 2023-10-08 NOTE — Progress Notes (Signed)
 Patient visits today to receive her B12 injection/vaccine. Patient was informed and tolerated well. Patient was notified to reach out to [place] if needed.

## 2023-11-03 DIAGNOSIS — D485 Neoplasm of uncertain behavior of skin: Secondary | ICD-10-CM | POA: Diagnosis not present

## 2023-11-03 DIAGNOSIS — L821 Other seborrheic keratosis: Secondary | ICD-10-CM | POA: Diagnosis not present

## 2023-11-03 DIAGNOSIS — L905 Scar conditions and fibrosis of skin: Secondary | ICD-10-CM | POA: Diagnosis not present

## 2023-11-04 ENCOUNTER — Ambulatory Visit (HOSPITAL_COMMUNITY)
Admission: RE | Admit: 2023-11-04 | Discharge: 2023-11-04 | Disposition: A | Discharge status: Home / Self Care | Source: Ambulatory Visit | Attending: Internal Medicine | Admitting: Internal Medicine

## 2023-11-04 DIAGNOSIS — R0609 Other forms of dyspnea: Secondary | ICD-10-CM | POA: Insufficient documentation

## 2023-11-05 LAB — ECHOCARDIOGRAM COMPLETE
Area-P 1/2: 3.03 cm2
S' Lateral: 2.3 cm

## 2023-11-11 ENCOUNTER — Ambulatory Visit (HOSPITAL_BASED_OUTPATIENT_CLINIC_OR_DEPARTMENT_OTHER)
Admission: RE | Admit: 2023-11-11 | Discharge: 2023-11-11 | Disposition: A | Discharge status: Home / Self Care | Source: Ambulatory Visit | Attending: Internal Medicine | Admitting: Internal Medicine

## 2023-11-11 DIAGNOSIS — M810 Age-related osteoporosis without current pathological fracture: Secondary | ICD-10-CM | POA: Insufficient documentation

## 2023-11-12 ENCOUNTER — Ambulatory Visit (INDEPENDENT_AMBULATORY_CARE_PROVIDER_SITE_OTHER)

## 2023-11-12 DIAGNOSIS — E538 Deficiency of other specified B group vitamins: Secondary | ICD-10-CM | POA: Diagnosis not present

## 2023-11-12 MED ORDER — CYANOCOBALAMIN 1000 MCG/ML IJ SOLN
1000.0000 ug | Freq: Once | INTRAMUSCULAR | Status: AC
  Administered 2023-11-12: 1000 ug via INTRAMUSCULAR

## 2023-11-12 NOTE — Progress Notes (Signed)
 Pt here for monthly B12 injection per   B12 1000mcg given IM and pt tolerated injection well.  Pt responded well to injection there were no questions or concerns   Please sign as Md is out of office

## 2023-11-13 ENCOUNTER — Ambulatory Visit: Payer: Self-pay | Admitting: Internal Medicine

## 2023-11-17 ENCOUNTER — Ambulatory Visit: Payer: Self-pay | Admitting: Internal Medicine

## 2023-11-24 ENCOUNTER — Other Ambulatory Visit: Payer: Medicare HMO

## 2023-12-15 ENCOUNTER — Ambulatory Visit (INDEPENDENT_AMBULATORY_CARE_PROVIDER_SITE_OTHER)

## 2023-12-15 DIAGNOSIS — E538 Deficiency of other specified B group vitamins: Secondary | ICD-10-CM

## 2023-12-15 DIAGNOSIS — Z23 Encounter for immunization: Secondary | ICD-10-CM | POA: Diagnosis not present

## 2023-12-15 MED ORDER — CYANOCOBALAMIN 1000 MCG/ML IJ SOLN
1000.0000 ug | Freq: Once | INTRAMUSCULAR | Status: AC
  Administered 2023-12-15: 1000 ug via INTRAMUSCULAR

## 2023-12-15 NOTE — Progress Notes (Signed)
 Pt was given HD flu vaccine in Lt Deltoid w/o any complications at this time.  Pt was given B12 injection in Rt Deltoid w/o any complications at this time.

## 2024-01-15 ENCOUNTER — Ambulatory Visit (INDEPENDENT_AMBULATORY_CARE_PROVIDER_SITE_OTHER)

## 2024-01-15 ENCOUNTER — Ambulatory Visit

## 2024-01-15 DIAGNOSIS — E538 Deficiency of other specified B group vitamins: Secondary | ICD-10-CM | POA: Diagnosis not present

## 2024-01-15 MED ORDER — CYANOCOBALAMIN 1000 MCG/ML IJ SOLN
1000.0000 ug | Freq: Once | INTRAMUSCULAR | Status: AC
  Administered 2024-01-15: 1000 ug via INTRAMUSCULAR

## 2024-01-15 NOTE — Progress Notes (Signed)
Pt here for monthly B12 injection per Dr. Okey Dupre  B12 given IM and pt tolerated injection well.

## 2024-02-13 ENCOUNTER — Ambulatory Visit

## 2024-02-13 DIAGNOSIS — E538 Deficiency of other specified B group vitamins: Secondary | ICD-10-CM

## 2024-02-13 MED ORDER — CYANOCOBALAMIN 1000 MCG/ML IJ SOLN
1000.0000 ug | Freq: Once | INTRAMUSCULAR | Status: AC
  Administered 2024-02-13: 1000 ug via INTRAMUSCULAR

## 2024-02-13 NOTE — Progress Notes (Signed)
 Patient visits today for their b-12 injection. Patient informed of what they had received and tolerated the injection well. Patient notified to reach out to the office if needed.

## 2024-02-15 NOTE — Telephone Encounter (Signed)
 Prolia  VOB initiated via [email or web address]  Next Prolia  inj DUE: 03/15/24

## 2024-02-28 NOTE — Telephone Encounter (Signed)
 VOB pending in [employer] portal

## 2024-03-06 NOTE — Telephone Encounter (Signed)
 VOB pending

## 2024-03-11 NOTE — Telephone Encounter (Signed)
 VOB also initiated for Jubbonti via [employer] portal

## 2024-03-11 NOTE — Telephone Encounter (Addendum)
 [employer]   [employer]  Prior Authorization REQUIRED for [employer]           [employer] and [employer]  Prior Authorization is not required.

## 2024-03-15 ENCOUNTER — Ambulatory Visit

## 2024-03-17 ENCOUNTER — Ambulatory Visit

## 2024-03-17 DIAGNOSIS — E538 Deficiency of other specified B group vitamins: Secondary | ICD-10-CM | POA: Diagnosis not present

## 2024-03-17 MED ORDER — CYANOCOBALAMIN 1000 MCG/ML IJ SOLN
1000.0000 ug | Freq: Once | INTRAMUSCULAR | Status: AC
  Administered 2024-03-17: 1000 ug via INTRAMUSCULAR

## 2024-03-17 NOTE — Progress Notes (Signed)
After obtaining consent, and per orders of Dr. Sharlet Salina, injection of B12 given by Marrian Salvage. Patient instructed to report any adverse reaction to me immediately.

## 2024-03-23 NOTE — Telephone Encounter (Signed)
 [employer]   [employer] and [employer]  The primary preferred product for your patient's health plan for requests for the treatment of osteoporosis is zoledronic acid. If the patient's therapy can be switched please select a preferred product.  Prior Authorization initiated for [employer]  via [employer] Case ID: [id number]  This request has duplicate/overlapping dates of service. Please void this request and modify the original authorization.( Overlap of Auth [id number])

## 2024-03-25 ENCOUNTER — Telehealth: Payer: Self-pay

## 2024-03-25 NOTE — Telephone Encounter (Signed)
 Patient called wanting to know if benefits can be ran for Jubbonti before she comes in on 04/01/24 with Dr. Trixie.

## 2024-04-01 ENCOUNTER — Ambulatory Visit: Payer: Medicare HMO | Admitting: Internal Medicine

## 2024-04-09 ENCOUNTER — Ambulatory Visit: Admitting: Gastroenterology

## 2024-04-16 ENCOUNTER — Ambulatory Visit: Admitting: Internal Medicine

## 2024-04-19 ENCOUNTER — Ambulatory Visit

## 2024-07-05 ENCOUNTER — Encounter: Admitting: Internal Medicine

## 2024-07-05 ENCOUNTER — Ambulatory Visit
